# Patient Record
Sex: Female | Born: 1945
Health system: Southern US, Community
[De-identification: ages and names within clinical notes are randomized; demographics above are authoritative.]

## PROBLEM LIST (undated history)

## (undated) DIAGNOSIS — G709 Myoneural disorder, unspecified: Secondary | ICD-10-CM

## (undated) DIAGNOSIS — E079 Disorder of thyroid, unspecified: Secondary | ICD-10-CM

## (undated) DIAGNOSIS — T7840XA Allergy, unspecified, initial encounter: Secondary | ICD-10-CM

## (undated) DIAGNOSIS — M1990 Unspecified osteoarthritis, unspecified site: Secondary | ICD-10-CM

## (undated) DIAGNOSIS — J45909 Unspecified asthma, uncomplicated: Secondary | ICD-10-CM

## (undated) DIAGNOSIS — M810 Age-related osteoporosis without current pathological fracture: Secondary | ICD-10-CM

## (undated) DIAGNOSIS — M797 Fibromyalgia: Secondary | ICD-10-CM

## (undated) HISTORY — DX: Disorder of thyroid, unspecified: E07.9

## (undated) HISTORY — DX: Allergy, unspecified, initial encounter: T78.40XA

## (undated) HISTORY — DX: Age-related osteoporosis without current pathological fracture: M81.0

## (undated) HISTORY — DX: Unspecified asthma, uncomplicated: J45.909

## (undated) HISTORY — DX: Unspecified osteoarthritis, unspecified site: M19.90

## (undated) HISTORY — DX: Fibromyalgia: M79.7

## (undated) HISTORY — DX: Myoneural disorder, unspecified: G70.9

---

## 2011-11-24 ENCOUNTER — Ambulatory Visit (INDEPENDENT_AMBULATORY_CARE_PROVIDER_SITE_OTHER): Payer: Medicare Other | Admitting: Family Medicine

## 2011-11-24 VITALS — BP 140/88 | HR 60 | Temp 98.0°F | Resp 16 | Ht 65.5 in | Wt 186.0 lb

## 2011-11-24 DIAGNOSIS — G43909 Migraine, unspecified, not intractable, without status migrainosus: Secondary | ICD-10-CM | POA: Insufficient documentation

## 2011-11-24 DIAGNOSIS — Z9109 Other allergy status, other than to drugs and biological substances: Secondary | ICD-10-CM

## 2011-11-24 DIAGNOSIS — IMO0001 Reserved for inherently not codable concepts without codable children: Secondary | ICD-10-CM

## 2011-11-24 DIAGNOSIS — K589 Irritable bowel syndrome without diarrhea: Secondary | ICD-10-CM | POA: Insufficient documentation

## 2011-11-24 DIAGNOSIS — E039 Hypothyroidism, unspecified: Secondary | ICD-10-CM | POA: Insufficient documentation

## 2011-11-24 DIAGNOSIS — M810 Age-related osteoporosis without current pathological fracture: Secondary | ICD-10-CM | POA: Insufficient documentation

## 2011-11-24 DIAGNOSIS — M797 Fibromyalgia: Secondary | ICD-10-CM

## 2011-11-24 DIAGNOSIS — J309 Allergic rhinitis, unspecified: Secondary | ICD-10-CM

## 2011-11-24 LAB — TSH: TSH: 0.47 u[IU]/mL (ref 0.350–4.500)

## 2011-11-24 MED ORDER — TRAMADOL HCL 50 MG PO TABS: 50.0000 mg | ORAL_TABLET | ORAL | Status: DC | PRN

## 2011-11-24 NOTE — Progress Notes (Signed)
  Subjective:    Patient ID: Jacqueline Wyatt, female    DOB: 1946-03-08, 66 y.o.   MRN: [id number]  HPI Comments: 66 yo woman with recently diagnosed hyhpothyroidism, now taking Synthroid 25 mcg on empty stomach first thing in the morning. Initially felt worse on the replacement dose of Synthroid, but now is better.  The fibromyalgia and chronic fatigue continue to be very bothersome.  IBS is not too bad now.  No diarrhea now.  Patient is now retired.     Review of Systems     Objective:   Physical Exam  Constitutional: She appears well-developed and well-nourished.  HENT:  Head: Normocephalic and atraumatic.  Right Ear: External ear normal.  Left Ear: External ear normal.  Mouth/Throat: Oropharynx is clear and moist.          Assessment & Plan:  Stable at present re: fibromyalgia. Thyroid status needs checking

## 2011-11-24 NOTE — Patient Instructions (Signed)
Hypothyroidism The thyroid is a large gland located in the lower front of your neck. The thyroid gland helps control metabolism. Metabolism is how your body handles food. It controls metabolism with the hormone thyroxine. When this gland is underactive (hypothyroid), it produces too little hormone.  CAUSES These include:   Absence or destruction of thyroid tissue.   Goiter due to iodine deficiency.   Goiter due to medications.   Congenital defects (since birth).   Problems with the pituitary. This causes a lack of TSH (thyroid stimulating hormone). This hormone tells the thyroid to turn out more hormone.  SYMPTOMS  Lethargy (feeling as though you have no energy)   Cold intolerance   Weight gain (in spite of normal food intake)   Dry skin   Coarse hair   Menstrual irregularity (if severe, may lead to infertility)   Slowing of thought processes  Cardiac problems are also caused by insufficient amounts of thyroid hormone. Hypothyroidism in the newborn is cretinism, and is an extreme form. It is important that this form be treated adequately and immediately or it will lead rapidly to retarded physical and mental development. DIAGNOSIS  To prove hypothyroidism, your caregiver may do blood tests and ultrasound tests. Sometimes the signs are hidden. It may be necessary for your caregiver to watch this illness with blood tests either before or after diagnosis and treatment. TREATMENT  Lafon levels of thyroid hormone are increased by using synthetic thyroid hormone. This is a safe, effective treatment. It usually takes about four weeks to gain the full effects of the medication. After you have the full effect of the medication, it will generally take another four weeks for problems to leave. Your caregiver may start you on Rozycki doses. If you have had heart problems the dose may be gradually increased. It is generally not an emergency to get rapidly to normal. HOME CARE INSTRUCTIONS   Take  your medications as your caregiver suggests. Let your caregiver know of any medications you are taking or start taking. Your caregiver will help you with dosage schedules.   As your condition improves, your dosage needs may increase. It will be necessary to have continuing blood tests as suggested by your caregiver.   Report all suspected medication side effects to your caregiver.  SEEK MEDICAL CARE IF: Seek medical care if you develop:  Sweating.   Tremulousness (tremors).   Anxiety.   Rapid weight loss.   Heat intolerance.   Emotional swings.   Diarrhea.   Weakness.  SEEK IMMEDIATE MEDICAL CARE IF:  You develop chest pain, an irregular heart beat (palpitations), or a rapid heart beat. MAKE SURE YOU:   Understand these instructions.   Will watch your condition.   Will get help right away if you are not doing well or get worse.  Document Released: 10/11/2005 Document Revised: 06/23/2011 Document Reviewed: 05/31/2008 [facility] Patient Information 2012 [place], [place].Fibromyalgia Fibromyalgia is a disorder that is often misunderstood. It is associated with muscular pains and tenderness that comes and goes. It is often associated with fatigue and sleep disturbances. Though it tends to be long-lasting, fibromyalgia is not life-threatening. CAUSES  The exact cause of fibromyalgia is unknown. People with certain gene types are predisposed to developing fibromyalgia and other conditions. Certain factors can play a role as triggers, such as:  Spine disorders.   Arthritis.   Severe injury (trauma) and other physical stressors.   Emotional stressors.  SYMPTOMS   The main symptom is pain and stiffness in the  muscles and joints, which can vary over time.   Sleep and fatigue problems.  Other related symptoms may include:  Bowel and bladder problems.   Headaches.   Visual problems.   Problems with odors and noises.   Depression or mood changes.   Painful periods  (dysmenorrhea).   Dryness of the skin or eyes.  DIAGNOSIS  There are no specific tests for diagnosing fibromyalgia. Patients can be diagnosed accurately from the specific symptoms they have. The diagnosis is made by determining that nothing else is causing the problems. TREATMENT  There is no cure. Management includes medicines and an active, healthy lifestyle. The goal is to enhance physical fitness, decrease pain, and improve sleep. HOME CARE INSTRUCTIONS   Only take over-the-counter or prescription medicines as directed by your caregiver. Sleeping pills, tranquilizers, and pain medicines may make your problems worse.   Evelyn-impact aerobic exercise is very important and advised for treatment. At first, it may seem to make pain worse. Gradually increasing your tolerance will overcome this feeling.   Learning relaxation techniques and how to control stress will help you. Biofeedback, visual imagery, hypnosis, muscle relaxation, yoga, and meditation are all options.   Anti-inflammatory medicines and physical therapy may provide short-term help.   Acupuncture or massage treatments may help.   Take muscle relaxant medicines as suggested by your caregiver.   Avoid stressful situations.   Plan a healthy lifestyle. This includes your diet, sleep, rest, exercise, and friends.   Find and practice a hobby you enjoy.   Join a fibromyalgia support group for interaction, ideas, and sharing advice. This may be helpful.  SEEK MEDICAL CARE IF:  You are not having good results or improvement from your treatment. FOR MORE INFORMATION  National Fibromyalgia Association: www.fmaware.org Arthritis Foundation: www.arthritis.org Document Released: 10/11/2005 Document Revised: 06/23/2011 Document Reviewed: 01/21/2010 [facility] Patient Information 2012 [place], [place].

## 2012-01-19 ENCOUNTER — Other Ambulatory Visit: Payer: Self-pay | Admitting: Family Medicine

## 2012-05-22 ENCOUNTER — Ambulatory Visit (INDEPENDENT_AMBULATORY_CARE_PROVIDER_SITE_OTHER): Payer: Medicare Other | Admitting: Family Medicine

## 2012-05-22 VITALS — BP 122/76 | HR 78 | Temp 98.6°F | Resp 16 | Ht 66.0 in | Wt 175.4 lb

## 2012-05-22 DIAGNOSIS — M62838 Other muscle spasm: Secondary | ICD-10-CM

## 2012-05-22 DIAGNOSIS — E039 Hypothyroidism, unspecified: Secondary | ICD-10-CM

## 2012-05-22 DIAGNOSIS — IMO0001 Reserved for inherently not codable concepts without codable children: Secondary | ICD-10-CM

## 2012-05-22 DIAGNOSIS — H6120 Impacted cerumen, unspecified ear: Secondary | ICD-10-CM

## 2012-05-22 DIAGNOSIS — M797 Fibromyalgia: Secondary | ICD-10-CM

## 2012-05-22 MED ORDER — METAXALONE 800 MG PO TABS: 800.0000 mg | ORAL_TABLET | Freq: Three times a day (TID) | ORAL | Status: DC

## 2012-05-22 MED ORDER — TRAMADOL HCL 50 MG PO TABS: 50.0000 mg | ORAL_TABLET | ORAL | Status: DC | PRN

## 2012-05-22 MED ORDER — LEVOTHYROXINE SODIUM 50 MCG PO TABS: 50.0000 ug | ORAL_TABLET | Freq: Every day | ORAL | Status: DC

## 2012-05-22 NOTE — Progress Notes (Signed)
[facility] [address], [place] [address] [phone]  Date:  05/22/2012   Name:  Jacqueline Wyatt   DOB:  10/07/1946   MRN:  [id number]  PCP:  No primary provider on file.    Chief Complaint: Fibromyalgia and Otalgia   History of Present Illness:  Jacqueline Wyatt is a 66 y.o. very pleasant female patient who presents with the following:  She uses tramadol and occasionally skelaxin for fibromyalgia.  She also needs a refill of her thyroid replacement medication. her last TSH check was about 6 months ago- will check this today as well.  She has also noted some muscle cramps for 10 years (at least)- this is mostly why she uses the skelaxin.    She also has an "ongoing problem" with ear wax build- up.  She has narrow ear canals- she has noted ear symptoms again for about 2 months.  She periodically needs to have her ears washed out- she thinks it is time again.  Her ears feel clogged but not really painful  Patient Active Problem List  Diagnosis  . Hypothyroid  . Migraine  . Environmental allergies  . Fibromyalgia  . Irritable bowel syndrome (IBS)  . Osteoporosis    No past medical history on file.  No past surgical history on file.  History  Substance Use Topics  . Smoking status: Never Smoker   . Smokeless tobacco: Not on file  . Alcohol Use: Not on file    Family History  Problem Relation Age of Onset  . Heart disease Mother   . Hypertension Mother   . Hyperlipidemia Mother   . Hearing loss Mother   . Kidney disease Mother   . Vision loss Mother   . Stroke Father   . Heart disease Maternal Uncle     Allergies  Allergen Reactions  . Almotriptan Malate     Chest pain  . Imitrex (Sumatriptan Base)   . Penicillins   . Relpax (Eletriptan Hydrobromide) Hypertension  . Sulfa Drugs Cross Reactors   . Vantin     dizziness  . Azithromycin Anxiety    Medication list has been reviewed and updated.  Current Outpatient Prescriptions on File Prior  to Visit  Medication Sig Dispense Refill  . aspirin 81 MG tablet Take 81 mg by mouth daily.      Marland Kitchen levothyroxine (SYNTHROID, LEVOTHROID) 50 MCG tablet take 1 tablet by mouth once daily  30 tablet  3  . loratadine (CLARITIN) 10 MG tablet Take 10 mg by mouth daily.      . metaxalone (SKELAXIN) 800 MG tablet Take 800 mg by mouth daily.      . montelukast (SINGULAIR) 10 MG tablet Take 10 mg by mouth at bedtime.      . rizatriptan (MAXALT) 10 MG tablet Take 10 mg by mouth. May repeat in 2 hours if needed      . traMADol (ULTRAM) 50 MG tablet Take 1 tablet (50 mg total) by mouth as needed for pain.  60 tablet  3  . levothyroxine (SYNTHROID, LEVOTHROID) 25 MCG tablet Take 25 mcg by mouth daily.        Review of Systems:  As per HPI- otherwise negative.   Physical Examination: Filed Vitals:   05/22/12 1600  BP: 122/76  Pulse: 78  Temp: 98.6 F (37 C)  Resp: 16   Filed Vitals:   05/22/12 1600  Height: 5\' 6"  (1.676 m)  Weight: 175 lb 6.4 oz (79.561  kg)   Body mass index is 28.31 kg/(m^2). Ideal Body Weight: Weight in (lb) to have BMI = 25: 154.6   GEN: WDWN, NAD, Non-toxic, A & O x 3 HEENT: Atraumatic, Normocephalic. Neck supple. No masses, No LAD.  Oropharynx wnl, PEERL. Jacqueline Wyatt has very narrow ear canals bilaterally, and she does have cerumen buildup which obstructs her TM bilaterally.  Thyroid exam normal Ears and Nose: No external deformity. CV: RRR, No M/G/R. No JVD. No thrill. No extra heart sounds. PULM: CTA B, no wheezes, crackles, rhonchi. No retractions. No resp. distress. No accessory muscle use. EXTR: No c/c/e NEURO Normal gait.  PSYCH: Normally interactive. Conversant. Not depressed or anxious appearing.  Calm demeanor.   Both ears lavaged with warm water.  Able to clear wax from the right, the left retained some wax.  Too dangerous to try and remove it manually due to location very deep in ear canal.  Visible right TM normal  Assessment and Plan: 1. Fibromyalgia   traMADol (ULTRAM) 50 MG tablet, DISCONTINUED: traMADol (ULTRAM) 50 MG tablet  2. Muscle spasm  metaxalone (SKELAXIN) 800 MG tablet  3. Hypothyroidism  levothyroxine (SYNTHROID, LEVOTHROID) 50 MCG tablet, TSH  4. Cerumen impaction     Lavaged ears as above. She will continue to work on her left ear with OTC wax removal drops- let us know if problem does not resolve.  Refilled her synthroid- await TSH and will call with result.  Refilled her tramadol and skelaxin a swell   Jacqueline Robinette, MD

## 2012-07-31 ENCOUNTER — Ambulatory Visit (INDEPENDENT_AMBULATORY_CARE_PROVIDER_SITE_OTHER): Payer: Medicare Other | Admitting: Family Medicine

## 2012-07-31 ENCOUNTER — Ambulatory Visit: Payer: Medicare Other

## 2012-07-31 VITALS — BP 118/88 | HR 61 | Temp 98.2°F | Resp 18 | Ht 66.0 in | Wt 180.0 lb

## 2012-07-31 DIAGNOSIS — M79676 Pain in unspecified toe(s): Secondary | ICD-10-CM

## 2012-07-31 DIAGNOSIS — G43909 Migraine, unspecified, not intractable, without status migrainosus: Secondary | ICD-10-CM

## 2012-07-31 DIAGNOSIS — M79646 Pain in unspecified finger(s): Secondary | ICD-10-CM

## 2012-07-31 DIAGNOSIS — Z23 Encounter for immunization: Secondary | ICD-10-CM

## 2012-07-31 DIAGNOSIS — H6120 Impacted cerumen, unspecified ear: Secondary | ICD-10-CM

## 2012-07-31 DIAGNOSIS — M79609 Pain in unspecified limb: Secondary | ICD-10-CM

## 2012-07-31 MED ORDER — RIZATRIPTAN BENZOATE 10 MG PO TABS: 10.0000 mg | ORAL_TABLET | ORAL | Status: DC | PRN

## 2012-07-31 NOTE — Progress Notes (Signed)
[facility] [address] [place] [address] [phone]  Date:  07/31/2012   Name:  Jacqueline Wyatt   DOB:  June 17, 1946   MRN:  [id number]  PCP:  No primary provider on file.    Chief Complaint: Medication Refill, Ear Fullness, Flu Vaccine, Immunizations and Toe Injury   History of Present Illness:  Jacqueline Wyatt is a 66 y.o. very pleasant female patient who presents with the following:  [name] has a few issues to discuss today:  She hurt her right great toe- she was carrying a piece of wood and dropped it onto her toe.  This occurred 2 days ago- it hurt a lot at first, and has not gotten any better.  She has tried ice which helped with the swelling but not the pain.   She is having a hard time walking but is able to get around slowly.    Also about 4 months ago she hit her right index finger.  It is still sore if she puts it in full flexion.  Otherwise it does not hurt.  She would like to have an x-ray to check on this.   She needs her Maxalt refilled as well.  Her migraines are less frequent.  However, when she does have a HA she needs a maxalt to abort the migraine pain. She has allergies to several triptans, but she has tolerated maxalt for several years without trouble.      She gets "extreme pain in my stomach" when she eats eggs.  She is not sure if she can take a flu shot- she has never had one  Her last tetanus was about 10 years ago and she would like to have a tdap today.   She is afraid that she has ear- wax build up again as her ears have felt full and congested   Patient Active Problem List  Diagnosis  . Hypothyroid  . Migraine  . Environmental allergies  . Fibromyalgia  . Irritable bowel syndrome (IBS)  . Osteoporosis    No past medical history on file.  No past surgical history on file.  History  Substance Use Topics  . Smoking status: Never Smoker   . Smokeless tobacco: Not on file  . Alcohol Use: Not on file    Family History    Problem Relation Age of Onset  . Heart disease Mother   . Hypertension Mother   . Hyperlipidemia Mother   . Hearing loss Mother   . Kidney disease Mother   . Vision loss Mother   . Stroke Father   . Heart disease Maternal Uncle     Allergies  Allergen Reactions  . Almotriptan Malate     Chest pain  . Imitrex (Sumatriptan Base)   . Penicillins   . Relpax (Eletriptan Hydrobromide) Hypertension  . Sulfa Drugs Cross Reactors   . Vantin     dizziness  . Azithromycin Anxiety    Medication list has been reviewed and updated.  Current Outpatient Prescriptions on File Prior to Visit  Medication Sig Dispense Refill  . aspirin 81 MG tablet Take 81 mg by mouth daily.      Marland Kitchen co-enzyme Q-10 30 MG capsule Take 30 mg by mouth 3 (three) times daily.      Marland Kitchen levothyroxine (SYNTHROID, LEVOTHROID) 50 MCG tablet Take 1 tablet (50 mcg total) by mouth daily.  30 tablet  5  . loratadine (CLARITIN) 10 MG tablet Take 10 mg by mouth daily.      Marland Kitchen  metaxalone (SKELAXIN) 800 MG tablet Take 1 tablet (800 mg total) by mouth 3 (three) times daily. As needed for muscle spasm  30 tablet  2  . montelukast (SINGULAIR) 10 MG tablet Take 10 mg by mouth at bedtime.      . rizatriptan (MAXALT) 10 MG tablet Take 10 mg by mouth. May repeat in 2 hours if needed      . traMADol (ULTRAM) 50 MG tablet Take 1 tablet (50 mg total) by mouth as needed for pain.  60 tablet  3    Review of Systems:  As per HPI- otherwise negative.   Physical Examination: Filed Vitals:   07/31/12 1521  BP: 118/88  Pulse: 61  Temp: 98.2 F (36.8 C)  Resp: 18   Filed Vitals:   07/31/12 1521  Height: 5\' 6"  (1.676 m)  Weight: 180 lb (81.647 kg)   Body mass index is 29.05 kg/(m^2). Ideal Body Weight: Weight in (lb) to have BMI = 25: 154.6   GEN: WDWN, NAD, Non-toxic, A & O x 3 HEENT: Atraumatic, Normocephalic. Neck supple. No masses, No LAD.  Oropharynx normal.  PEERL,EOMI.    Very small, torturous ear canals bilaterally with  mild wax build- up.  I am able to see the TM bilaterally, no redness or retractions.   Ears and Nose: No external deformity. CV: RRR, No M/G/R. No JVD. No thrill. No extra heart sounds. PULM: CTA B, no wheezes, crackles, rhonchi. No retractions. No resp. distress. No accessory muscle use. EXTR: No c/c/e.  The right great toe is bruised and diffusely tender.  No deformity or swelling.  She has some tenderness with extreme flexion of her right index finger.  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   NEURO Normal gait.  PSYCH: Normally interactive. Conversant. Not depressed or anxious appearing.  Calm demeanor.   [facility] reading (PRIMARY) by  Dr. Patsy Lager. Right hand: negative for fracture Right foot: no definite fracture  Assessment and Plan: 1. Pain in toe  DG Foot Complete Right  2. Pain in finger  DG Hand Complete Right  3. Migraine  rizatriptan (MAXALT) 10 MG tablet  4. Need for diphtheria-tetanus-pertussis (Tdap) vaccine, adult/adolescent  Tdap vaccine greater than or equal to 7yo IM   Updated Djuana's Tdap today.  Decided not to give flu as she has an egg allergy of undetermined significance.  Irrigated her ears and removed some cerumen.    I do not see a definite fracture of her great toe.  Buddy taped and placed in a post- op shoe.  No fracture seen of her right index finger.  She declined crutches today. Will follow- up as soon as I receive her over- read report.    Abbe Amsterdam, MD

## 2012-08-01 ENCOUNTER — Encounter: Payer: Self-pay | Admitting: Family Medicine

## 2012-11-17 ENCOUNTER — Other Ambulatory Visit: Payer: Self-pay | Admitting: Family Medicine

## 2012-11-17 NOTE — Telephone Encounter (Signed)
Needs office visit.

## 2012-12-20 ENCOUNTER — Other Ambulatory Visit: Payer: Self-pay | Admitting: Physician Assistant

## 2013-01-10 ENCOUNTER — Ambulatory Visit (INDEPENDENT_AMBULATORY_CARE_PROVIDER_SITE_OTHER): Payer: Medicare Other | Admitting: Family Medicine

## 2013-01-10 VITALS — BP 132/84 | HR 68 | Temp 98.1°F | Resp 12 | Ht 66.0 in | Wt 191.4 lb

## 2013-01-10 DIAGNOSIS — M797 Fibromyalgia: Secondary | ICD-10-CM

## 2013-01-10 DIAGNOSIS — E039 Hypothyroidism, unspecified: Secondary | ICD-10-CM

## 2013-01-10 DIAGNOSIS — L659 Nonscarring hair loss, unspecified: Secondary | ICD-10-CM

## 2013-01-10 MED ORDER — LEVOTHYROXINE SODIUM 50 MCG PO TABS: 50.0000 ug | ORAL_TABLET | Freq: Every day | ORAL | Status: DC

## 2013-01-10 NOTE — Patient Instructions (Signed)
We are unable to check your thyroid test today as you have been off medicine.  Return for lab only visit in next 6 weeks, then recheck office visit with a provider 1-2 weeks later to discuss the hair loss and swollen area in hand. Return to the clinic or go to the nearest emergency room if any of your symptoms worsen or new symptoms occur.

## 2013-01-10 NOTE — Progress Notes (Signed)
  Subjective:    Patient ID: Jacqueline Wyatt, female    DOB: [birth year], 67 y.o.   MRN: [id number]  HPI [name] is a 67 y.o. female PCP: [facility]. HA specialist: [facility] - Dr. Neale Burly.  Hypothyroidism - takes levothyroxine qd. No new side effects.  Ran out of meds 5 days ago. Hx of fibromyalgia.    Results for orders placed in visit on 05/22/12  TSH      Result Value Range   TSH 1.890  0.350 - 4.500 uIU/mL      Review of Systems  Endocrine: Positive for cold intolerance.       Some hair loss in top of scalp - one small area.   Musculoskeletal: Positive for myalgias (with fibromyalgia. ).       Objective:   Physical Exam  Vitals reviewed. Constitutional: She is oriented to person, place, and time. She appears well-developed and well-nourished.  HENT:  Head: Normocephalic and atraumatic.  Eyes: Conjunctivae and EOM are normal. Pupils are equal, round, and reactive to light.  Neck: Neck supple. Carotid bruit is not present. Thyromegaly present.  Cardiovascular: Normal rate, regular rhythm, normal heart sounds and intact distal pulses.   Pulmonary/Chest: Effort normal and breath sounds normal.  Abdominal: Soft. She exhibits no pulsatile midline mass. There is no tenderness.  Musculoskeletal:       Hands: Lymphadenopathy:    She has no cervical adenopathy.  Neurological: She is alert and oriented to person, place, and time.  Skin: Skin is warm and dry.     Psychiatric: She has a normal mood and affect. Her behavior is normal.          Assessment & Plan:  Unspecified hypothyroidism - Plan: levothyroxine (SYNTHROID, LEVOTHROID) 50 MCG tablet, TSH.  Off meds - restarted, but advised need for routine follow up and lab only in 6 weeks, then to follow up to discuss hair loss and other concerns.   Fibromyalgia - stable.  Continue skelaxin as prior rx.   Hair loss - Plan: TSH.  As above.,  Dx traction alopecia, but not pulling hair that tight in area. Recheck after tsh.    At end of visit - asked about L hand bump - see exam.  Sore at times. No weakness noted.  ddx tenosynovitis of flexor tendon.  Plan to recheck next ov further, but rom,strength grossly intact.   Patient Instructions  We are unable to check your thyroid test today as you have been off medicine.  Return for lab only visit in next 6 weeks, then recheck office visit with a provider 1-2 weeks later to discuss the hair loss and swollen area in hand. Return to the clinic or go to the nearest emergency room if any of your symptoms worsen or new symptoms occur.    Meds ordered this encounter  Medications  . levothyroxine (SYNTHROID, LEVOTHROID) 50 MCG tablet    Sig: Take 1 tablet (50 mcg total) by mouth daily.    Dispense:  90 tablet    Refill:  0

## 2013-02-01 ENCOUNTER — Telehealth: Payer: Self-pay | Admitting: Family Medicine

## 2013-02-01 NOTE — Telephone Encounter (Signed)
Received a notice from [place]- concern about using skelaxin in seniors over 65.  Sent notice on to pt as an FYI.  As long as the medication helps her and she does not feel sedated should be ok for occasional use.

## 2013-02-22 ENCOUNTER — Other Ambulatory Visit (INDEPENDENT_AMBULATORY_CARE_PROVIDER_SITE_OTHER): Payer: Medicare Other

## 2013-02-22 DIAGNOSIS — E039 Hypothyroidism, unspecified: Secondary | ICD-10-CM

## 2013-02-22 DIAGNOSIS — L659 Nonscarring hair loss, unspecified: Secondary | ICD-10-CM

## 2013-02-23 LAB — TSH: TSH: 3.215 u[IU]/mL (ref 0.350–4.500)

## 2013-02-26 ENCOUNTER — Other Ambulatory Visit: Payer: Self-pay | Admitting: Family Medicine

## 2013-02-26 DIAGNOSIS — E039 Hypothyroidism, unspecified: Secondary | ICD-10-CM

## 2013-02-26 MED ORDER — LEVOTHYROXINE SODIUM 50 MCG PO TABS: 50.0000 ug | ORAL_TABLET | Freq: Every day | ORAL | Status: DC

## 2013-04-13 ENCOUNTER — Ambulatory Visit (INDEPENDENT_AMBULATORY_CARE_PROVIDER_SITE_OTHER): Payer: Medicare Other | Admitting: Family Medicine

## 2013-04-13 VITALS — BP 122/87 | HR 82 | Temp 97.9°F | Resp 16 | Ht 65.5 in | Wt 188.0 lb

## 2013-04-13 DIAGNOSIS — M797 Fibromyalgia: Secondary | ICD-10-CM

## 2013-04-13 DIAGNOSIS — IMO0001 Reserved for inherently not codable concepts without codable children: Secondary | ICD-10-CM

## 2013-04-13 DIAGNOSIS — E039 Hypothyroidism, unspecified: Secondary | ICD-10-CM

## 2013-04-13 MED ORDER — LEVOTHYROXINE SODIUM 50 MCG PO TABS: 50.0000 ug | ORAL_TABLET | Freq: Every day | ORAL | Status: DC

## 2013-04-13 MED ORDER — TRAMADOL HCL 50 MG PO TABS: 50.0000 mg | ORAL_TABLET | ORAL | Status: DC | PRN

## 2013-04-13 NOTE — Patient Instructions (Signed)

## 2013-04-13 NOTE — Progress Notes (Signed)
67 yo woman with fibromyalgia here for med refill.  She is concerned about her hair and so won't do water aerobics.  Objective:  NAD HEENT:  Unremarkable Chest:  Clear Heart: reg, no murmur Skin: clear Results for orders placed in visit on 02/22/13  TSH      Result Value Range   TSH 3.215  0.350 - 4.500 uIU/mL    Assessment:  Stable Unspecified hypothyroidism - Plan: levothyroxine (SYNTHROID, LEVOTHROID) 50 MCG tablet  Fibromyalgia - Plan: traMADol (ULTRAM) 50 MG tablet  Signed, Elvina Sidle, MD

## 2014-02-05 ENCOUNTER — Ambulatory Visit (INDEPENDENT_AMBULATORY_CARE_PROVIDER_SITE_OTHER): Payer: Medicare Other | Admitting: Internal Medicine

## 2014-02-05 VITALS — BP 124/66 | HR 81 | Temp 98.1°F | Resp 16 | Ht 65.5 in | Wt 190.4 lb

## 2014-02-05 DIAGNOSIS — J069 Acute upper respiratory infection, unspecified: Secondary | ICD-10-CM

## 2014-02-05 DIAGNOSIS — M797 Fibromyalgia: Secondary | ICD-10-CM

## 2014-02-05 DIAGNOSIS — R059 Cough, unspecified: Secondary | ICD-10-CM

## 2014-02-05 DIAGNOSIS — E039 Hypothyroidism, unspecified: Secondary | ICD-10-CM

## 2014-02-05 DIAGNOSIS — IMO0001 Reserved for inherently not codable concepts without codable children: Secondary | ICD-10-CM

## 2014-02-05 DIAGNOSIS — R05 Cough: Secondary | ICD-10-CM

## 2014-02-05 LAB — POCT CBC
GRANULOCYTE PERCENT: 51 % (ref 37–80)
HCT, POC: 44 % (ref 37.7–47.9)
Hemoglobin: 14.1 g/dL (ref 12.2–16.2)
Lymph, poc: 2.3 (ref 0.6–3.4)
MCH, POC: 31.6 pg — AB (ref 27–31.2)
MCHC: 32 g/dL (ref 31.8–35.4)
MCV: 98.7 fL — AB (ref 80–97)
MID (cbc): 0.6 (ref 0–0.9)
MPV: 8.7 fL (ref 0–99.8)
POC GRANULOCYTE: 3.1 (ref 2–6.9)
POC LYMPH PERCENT: 39 %L (ref 10–50)
POC MID %: 10 % (ref 0–12)
Platelet Count, POC: 376 10*3/uL (ref 142–424)
RBC: 4.46 M/uL (ref 4.04–5.48)
RDW, POC: 13.5 %
WBC: 6 10*3/uL (ref 4.6–10.2)

## 2014-02-05 MED ORDER — LEVOTHYROXINE SODIUM 50 MCG PO TABS: 50.0000 ug | ORAL_TABLET | Freq: Every day | ORAL | Status: DC

## 2014-02-05 MED ORDER — TRAMADOL HCL 50 MG PO TABS: 50.0000 mg | ORAL_TABLET | ORAL | Status: DC | PRN

## 2014-02-05 NOTE — Progress Notes (Signed)
Subjective:     Patient ID: Jacqueline RobertsJudith Wyatt, female   DOB: [birth year], 68 y.o.   MRN: [id number]  Cough Associated symptoms include chills, myalgias and rhinorrhea. Pertinent negatives include no chest pain, ear pain, headaches, rash, sore throat, shortness of breath or wheezing. Her past medical history is significant for environmental allergies.   68 YO caucasian female [facility] with two weeks of chest congestion with dry cough and rhinorrhea. Also during the first week of the illness she was febrile as high as 101, had body aches and chills, in addition to fatigue and weakness. Last Sunday all symptoms other than her cough congestion and rhinorrhea stopped. She did not have a flu shot this year and she had a known sick contact with the flu. For a week she was taking 2 325 ASA twice daily to help with the fevers but stopped a week ago when the fevers stopped. She takes her Claritin daily for allergy symptoms and tramadol daily for her fibromyalgia. She also states she feels like she has heard "rales when breathing at home.    Review of Systems  Constitutional: Positive for chills, activity change, appetite change and fatigue.       She had chills fever and diaphoresis for one week that she says ceased one week ago  HENT: Positive for congestion, rhinorrhea and sinus pressure. Negative for ear discharge, ear pain, sore throat, trouble swallowing and voice change.   Respiratory: Positive for cough. Negative for shortness of breath and wheezing.        Chest pain with cough Abnormal breath sounds  Cardiovascular: Negative for chest pain, palpitations and leg swelling.  Gastrointestinal: Negative for abdominal pain, diarrhea, constipation, blood in stool and abdominal distention.       1 episode of vomiting last week  Endocrine: Negative for polyuria.  Genitourinary: Negative for dysuria and frequency.  Musculoskeletal: Positive for arthralgias, back pain and myalgias.  Skin: Negative for rash.   Allergic/Immunologic: Positive for environmental allergies.  Neurological: Positive for weakness. Negative for dizziness, syncope and headaches.   Patient Active Problem List   Diagnosis Date Noted  . Hypothyroid 11/24/2011  . Migraine 11/24/2011  . Environmental allergies 11/24/2011  . Fibromyalgia 11/24/2011  . Irritable bowel syndrome (IBS) 11/24/2011  . Osteoporosis 11/24/2011    Prior to Admission medications   Medication Sig Start Date End Date Taking? Authorizing Provider  aspirin 81 MG tablet Take 81 mg by mouth daily.   Yes Historical Provider, MD  calcium-vitamin D (OSCAL WITH D) 500-200 MG-UNIT per tablet Take 1 tablet by mouth.   Yes Historical Provider, MD  co-enzyme Q-10 30 MG capsule Take 30 mg by mouth 3 (three) times daily.   Yes Historical Provider, MD  levothyroxine (SYNTHROID, LEVOTHROID) 50 MCG tablet Take 1 tablet (50 mcg total) by mouth daily. 02/05/14  Yes Tonye Pearsonobert P Doolittle, MD  loratadine (CLARITIN) 10 MG tablet Take 10 mg by mouth daily.   Yes Historical Provider, MD  rizatriptan (MAXALT) 10 MG tablet Take 1 tablet (10 mg total) by mouth as needed for migraine. May repeat in 2 hours if needed 07/31/12  Yes Gwenlyn FoundJessica C Copland, MD  traMADol (ULTRAM) 50 MG tablet Take 1 tablet (50 mg total) by mouth as needed. 02/05/14  Yes Tonye Pearsonobert P Doolittle, MD  metaxalone (SKELAXIN) 800 MG tablet Take 1 tablet (800 mg total) by mouth 3 (three) times daily. As needed for muscle spasm 05/22/12   Pearline CablesJessica C Copland, MD  montelukast (SINGULAIR) 10  MG tablet Take 10 mg by mouth at bedtime.    Historical Provider, MD       Objective:   Physical Exam  Constitutional: She is oriented to person, place, and time. She appears well-developed and well-nourished. No distress.  HENT:  Head: Normocephalic and atraumatic.  Mouth/Throat: Oropharynx is clear and moist. No oropharyngeal exudate.  Bilateral cerumen impaction   Eyes: Conjunctivae are normal. Pupils are equal, round, and reactive  to light.  Cardiovascular: Normal rate, regular rhythm, normal heart sounds and intact distal pulses.  Exam reveals no gallop and no friction rub.   No murmur heard. Pulmonary/Chest: Effort normal and breath sounds normal. No respiratory distress. She has no wheezes. She has no rales. She exhibits no tenderness.  Abdominal: Soft. Bowel sounds are normal. She exhibits no distension. There is no tenderness. There is no rebound and no guarding.  Musculoskeletal: She exhibits no edema.  Neurological: She is alert and oriented to person, place, and time.  Skin: Skin is warm and dry. She is not diaphoretic.  Psychiatric: She has a normal mood and affect.         Assessment:    1) URI 2) hypothyroid 3) medication review     Plan:    1) refilled both tramadol and levothyroxine. Will potentially adjust levothyroxine dose pending TSH. Advised patient that this is likely the end of a course of the flu and that we can no longer test her for the flu as it is 1 week after her symptoms have subsided. Encouraged to use supportive therapy for her URI.    I have completed the patient encounter in its entirety as documented by [facility], with editing by me where necessary. Robert P. Merla Richesoolittle, M.D.

## 2014-02-06 LAB — LIPID PANEL
CHOL/HDL RATIO: 3.2 ratio
CHOLESTEROL: 196 mg/dL (ref 0–200)
HDL: 61 mg/dL (ref >39)
LDL Cholesterol: 96 mg/dL (ref 0–99)
TRIGLYCERIDES: 194 mg/dL — AB (ref <150)
VLDL: 39 mg/dL (ref 0–40)

## 2014-02-06 LAB — TSH: TSH: 2.765 u[IU]/mL (ref 0.350–4.500)

## 2014-02-10 ENCOUNTER — Encounter: Payer: Self-pay | Admitting: Internal Medicine

## 2014-04-11 ENCOUNTER — Encounter: Payer: Self-pay | Admitting: Family Medicine

## 2014-05-03 ENCOUNTER — Encounter: Payer: Self-pay | Admitting: Family Medicine

## 2014-07-05 ENCOUNTER — Telehealth: Payer: Self-pay | Admitting: *Deleted

## 2014-07-05 NOTE — Telephone Encounter (Signed)
Attempted to phone patient to schedule Annual Medicare Wellness Exam, no answer.  Composing & mailing letter.

## 2014-07-30 ENCOUNTER — Telehealth: Payer: Self-pay | Admitting: *Deleted

## 2014-07-30 NOTE — Telephone Encounter (Signed)
Phoned patient and scheduled AMWE/Colo/FOBT screening with Dr. Patsy Lageropland for Monday, 09/09/14 @ 11am.

## 2014-09-09 ENCOUNTER — Encounter: Payer: Self-pay | Admitting: Family Medicine

## 2014-09-09 ENCOUNTER — Ambulatory Visit (INDEPENDENT_AMBULATORY_CARE_PROVIDER_SITE_OTHER): Payer: Medicare Other | Admitting: Family Medicine

## 2014-09-09 VITALS — BP 132/75 | HR 88 | Temp 98.5°F | Resp 16 | Ht 66.0 in | Wt 192.0 lb

## 2014-09-09 DIAGNOSIS — E663 Overweight: Secondary | ICD-10-CM

## 2014-09-09 DIAGNOSIS — Z124 Encounter for screening for malignant neoplasm of cervix: Secondary | ICD-10-CM

## 2014-09-09 DIAGNOSIS — Z131 Encounter for screening for diabetes mellitus: Secondary | ICD-10-CM

## 2014-09-09 DIAGNOSIS — Z130 Encounter for screening for diseases of the blood and blood-forming organs and certain disorders involving the immune mechanism: Secondary | ICD-10-CM

## 2014-09-09 DIAGNOSIS — F411 Generalized anxiety disorder: Secondary | ICD-10-CM | POA: Insufficient documentation

## 2014-09-09 DIAGNOSIS — Z0000 Encounter for general adult medical examination without abnormal findings: Secondary | ICD-10-CM

## 2014-09-09 DIAGNOSIS — E038 Other specified hypothyroidism: Secondary | ICD-10-CM

## 2014-09-09 DIAGNOSIS — Z1211 Encounter for screening for malignant neoplasm of colon: Secondary | ICD-10-CM

## 2014-09-09 DIAGNOSIS — Z23 Encounter for immunization: Secondary | ICD-10-CM

## 2014-09-09 LAB — COMPREHENSIVE METABOLIC PANEL
ALBUMIN: 4.1 g/dL (ref 3.5–5.2)
ALT: 21 U/L (ref 0–35)
AST: 23 U/L (ref 0–37)
Alkaline Phosphatase: 75 U/L (ref 39–117)
BUN: 17 mg/dL (ref 6–23)
CO2: 24 meq/L (ref 19–32)
Calcium: 9.8 mg/dL (ref 8.4–10.5)
Chloride: 104 mEq/L (ref 96–112)
Creat: 0.98 mg/dL (ref 0.50–1.10)
GLUCOSE: 104 mg/dL — AB (ref 70–99)
POTASSIUM: 4.6 meq/L (ref 3.5–5.3)
SODIUM: 140 meq/L (ref 135–145)
Total Bilirubin: 0.4 mg/dL (ref 0.2–1.2)
Total Protein: 7.4 g/dL (ref 6.0–8.3)

## 2014-09-09 LAB — CBC
HCT: 41.3 % (ref 36.0–46.0)
HEMOGLOBIN: 14.6 g/dL (ref 12.0–15.0)
MCH: 32.4 pg (ref 26.0–34.0)
MCHC: 35.4 g/dL (ref 30.0–36.0)
MCV: 91.8 fL (ref 78.0–100.0)
MPV: 10 fL (ref 9.4–12.4)
Platelets: 284 10*3/uL (ref 150–400)
RBC: 4.5 MIL/uL (ref 3.87–5.11)
RDW: 13.8 % (ref 11.5–15.5)
WBC: 4.5 10*3/uL (ref 4.0–10.5)

## 2014-09-09 LAB — TSH: TSH: 2.774 u[IU]/mL (ref 0.350–4.500)

## 2014-09-09 NOTE — Progress Notes (Signed)
[facility] [address], [place] [address] [id number]- [phone]  Date:  09/09/2014   Name:  Jacqueline Wyatt   DOB:  06/12/1946   MRN:  [id number]  PCP:  No PCP Per Patient    Chief Complaint: Annual Exam   History of Present Illness:  Jacqueline Wyatt is a 68 y.o. very pleasant female patient who presents with the following:  Here today for a CPE.  Except for her fibromyalgia and other chronic complaints she is doing well.  She notes that she tends to have fatigue; wonders if she might have chronic fatigue syndrome. She may sleep 9- 10 hours a night.  She does feel better once she gets up and gets moving.  She has not been told that she snores.    She has never had a colonoscopy- did have sigmoid years ago.  She does have IBS and states she has had this "since I was 68 years old.  I remember being 68 years old and having IBS."  However these sx are not that severe as long as she manages her stress.    She did have some cereal today.  Not fasting completly She has never had a pneumonia vaccine. She also needs a shingles vaccine rx.    Her last pap was done here.  It was in 2004.  She states that her last pap was very painful- she is willing to give this a try today but we will stop if she has too much discomfort.  She states that she would not consider being sexually active due to pain, but she does not want to have sex anyway so she does not desire to try any estrogen cream, etc.   She bring with her lots of unusual lab results from various clinics and labs- testing her hair, stool studies for various chemicals, etc.  Explained to her that I am not able to tell her what if anything these labs mean.  However she does show me some "neurotransmitter testing" that she had done that shows her serotonin and dopamine levels are [place].  However she is not interested in trying any medication for depression  She gives a lot of detailed information about her health going back many years in the  past.  She seems anxious and very worried about her health.  Has several theories about the etiology of her health problems that she goes into in detail.    Patient Active Problem List   Diagnosis Date Noted  . Hypothyroid 11/24/2011  . Migraine 11/24/2011  . Environmental allergies 11/24/2011  . Fibromyalgia 11/24/2011  . Irritable bowel syndrome (IBS) 11/24/2011  . Osteoporosis 11/24/2011    Past Medical History  Diagnosis Date  . Allergy   . Arthritis   . Neuromuscular disorder   . Asthma   . Thyroid disease   . Osteoporosis   . Fibromyalgia muscle pain   . Fibromyalgia     History reviewed. No pertinent past surgical history.  History  Substance Use Topics  . Smoking status: Never Smoker   . Smokeless tobacco: Not on file  . Alcohol Use: Not on file    Family History  Problem Relation Age of Onset  . Heart disease Mother   . Hypertension Mother   . Hyperlipidemia Mother   . Hearing loss Mother   . Kidney disease Mother   . Vision loss Mother   . Lung disease Mother   . Stroke Father   . Heart disease  Maternal Uncle   . Arthritis Sister   . Meniere's disease Brother   . Cancer Maternal Grandmother   . Stroke Maternal Grandmother   . Heart disease Maternal Grandfather     Allergies  Allergen Reactions  . Almotriptan Malate     Chest pain  . Eggs Or Egg-Derived Products     Stomach pain  . Imitrex [Sumatriptan Base]   . Penicillins   . Relpax [Eletriptan Hydrobromide] Hypertension  . Sulfa Drugs Cross Reactors   . Vantin     dizziness  . Azithromycin Anxiety    Medication list has been reviewed and updated.  Current Outpatient Prescriptions on File Prior to Visit  Medication Sig Dispense Refill  . aspirin 81 MG tablet Take 81 mg by mouth daily.    . calcium-vitamin D (OSCAL WITH D) 500-200 MG-UNIT per tablet Take 1 tablet by mouth.    . co-enzyme Q-10 30 MG capsule Take 30 mg by mouth 3 (three) times daily.    Marland Kitchen. levothyroxine (SYNTHROID,  LEVOTHROID) 50 MCG tablet Take 1 tablet (50 mcg total) by mouth daily. 90 tablet 3  . loratadine (CLARITIN) 10 MG tablet Take 10 mg by mouth daily.    . metaxalone (SKELAXIN) 800 MG tablet Take 1 tablet (800 mg total) by mouth 3 (three) times daily. As needed for muscle spasm 30 tablet 2  . montelukast (SINGULAIR) 10 MG tablet Take 10 mg by mouth at bedtime.    . rizatriptan (MAXALT) 10 MG tablet Take 1 tablet (10 mg total) by mouth as needed for migraine. May repeat in 2 hours if needed 10 tablet 2  . traMADol (ULTRAM) 50 MG tablet Take 1 tablet (50 mg total) by mouth as needed. 60 tablet 5   No current facility-administered medications on file prior to visit.    Review of Systems:  As per HPI- otherwise negative.   Physical Examination: Filed Vitals:   09/09/14 1120  BP: 132/75  Pulse: 88  Temp: 98.5 F (36.9 C)  Resp: 16   Filed Vitals:   09/09/14 1120  Height: 5\' 6"  (1.676 m)  Weight: 192 lb (87.091 kg)   Body mass index is 31 kg/(m^2). Ideal Body Weight: Weight in (lb) to have BMI = 25: 154.6  GEN: WDWN, NAD, Non-toxic, A & O x 3, very talkative and anxious, overweight HEENT: Atraumatic, Normocephalic. Neck supple. No masses, No LAD.  Bilateral TM wnl, oropharynx normal.  PEERL,EOMI.   Ears and Nose: No external deformity. CV: RRR, No M/G/R. No JVD. No thrill. No extra heart sounds. PULM: CTA B, no wheezes, crackles, rhonchi. No retractions. No resp. distress. No accessory muscle use. ABD: S, NT, ND, +BS. No rebound. No HSM. EXTR: No c/c/e NEURO Normal gait.  PSYCH: Normally interactive. Conversant.  GU: performed a gentle speculum exam with smallest speculum available.  She was able to tolerate a pap.  Did not perform bimanual exam  Assessment and Plan: Other specified hypothyroidism - Plan: TSH  Immunization due - Plan: Pneumococcal conjugate vaccine 13-valent IM  Overweight - Plan: CANCELED: Lipid panel  Screening for diabetes mellitus - Plan: Comprehensive  metabolic panel  Screening for deficiency anemia - Plan: CBC  Screening for cervical cancer - Plan: Pap IG and HPV (high risk) DNA detection  Screening for colon cancer - Plan: Ambulatory referral to Gastroenterology  Generalized anxiety disorder  Updated her prevnar today, she refuses a flu shot She refuses a colonoscopy but might consider a sigmoidoscopy which she had in the  past- will make referral for her  She is not willing to consider treatment for depression and anxiety today.   Will plan further follow- up pending labs.   Signed Abbe AmsterdamJessica Dallon Dacosta, MD

## 2014-09-09 NOTE — Patient Instructions (Addendum)
I will be in touch with your labs.  Assuming your pap looks ok you do not have to have one again!  We will set you up to see GI for a sigmoidoscopy if you would prefer

## 2014-09-10 ENCOUNTER — Encounter: Payer: Self-pay | Admitting: Family Medicine

## 2014-09-10 LAB — PAP IG AND HPV HIGH-RISK: HPV DNA HIGH RISK: NOT DETECTED

## 2014-10-30 ENCOUNTER — Ambulatory Visit (INDEPENDENT_AMBULATORY_CARE_PROVIDER_SITE_OTHER): Payer: Medicare Other | Admitting: Nurse Practitioner

## 2014-10-30 ENCOUNTER — Encounter: Payer: Self-pay | Admitting: Nurse Practitioner

## 2014-10-30 VITALS — BP 138/84 | HR 80 | Ht 66.0 in | Wt 195.0 lb

## 2014-10-30 DIAGNOSIS — Z1211 Encounter for screening for malignant neoplasm of colon: Secondary | ICD-10-CM

## 2014-10-30 NOTE — Patient Instructions (Addendum)
It has been recommended to you by your physician that you have a(n)Colonoscopy  completed. Per your request, we did not schedule the procedure(s) today. Please contact our office at [phone] should you decide to have the procedure completed. 

## 2014-10-31 ENCOUNTER — Encounter: Payer: Self-pay | Admitting: Nurse Practitioner

## 2014-10-31 DIAGNOSIS — Z1211 Encounter for screening for malignant neoplasm of colon: Secondary | ICD-10-CM | POA: Insufficient documentation

## 2014-10-31 NOTE — Progress Notes (Signed)
Agree with initial assessment and plans based on patient preference for colon cancer screening

## 2014-10-31 NOTE — Progress Notes (Signed)
HPI :  Patient is a 69 year old female, new to this practice, referred for colon cancer screening. She has a longstanding history of D-IBS but manages it without meds. No other GI complaints. Specifically, no blood in stool, bowel changes, abdominal pain or weight loss. No FMH of colon cancer. November 2015 labs including CBC, CMET, TSH were unremarkable.    Past Medical History  Diagnosis Date  . Allergy   . Arthritis   . Neuromuscular disorder   . Asthma   . Thyroid disease   . Osteoporosis   . Fibromyalgia     Family History  Problem Relation Age of Onset  . Heart disease Mother   . Hypertension Mother   . Hyperlipidemia Mother   . Hearing loss Mother   . Kidney disease Mother   . Vision loss Mother   . Lung disease Mother   . Stroke Father   . Heart disease Maternal Uncle   . Arthritis Sister   . Meniere's disease Brother   . Cancer Maternal Grandmother   . Stroke Maternal Grandmother   . Heart disease Maternal Grandfather    History  Substance Use Topics  . Smoking status: Never Smoker   . Smokeless tobacco: Not on file  . Alcohol Use: Not on file   Current Outpatient Prescriptions  Medication Sig Dispense Refill  . aspirin 81 MG tablet Take 81 mg by mouth daily.    . Biotin 1 MG CAPS Take by mouth.    . calcium-vitamin D (OSCAL WITH D) 500-200 MG-UNIT per tablet Take 1 tablet by mouth.    . co-enzyme Q-10 30 MG capsule Take 30 mg by mouth 3 (three) times daily.    Marland Kitchen levothyroxine (SYNTHROID, LEVOTHROID) 50 MCG tablet Take 1 tablet (50 mcg total) by mouth daily. 90 tablet 3  . loratadine (CLARITIN) 10 MG tablet Take 10 mg by mouth daily.    . metaxalone (SKELAXIN) 800 MG tablet Take 1 tablet (800 mg total) by mouth 3 (three) times daily. As needed for muscle spasm 30 tablet 2  . montelukast (SINGULAIR) 10 MG tablet Take 10 mg by mouth at bedtime.    . rizatriptan (MAXALT) 10 MG tablet Take 1 tablet (10 mg total) by mouth as needed for migraine. May repeat in  2 hours if needed 10 tablet 2  . traMADol (ULTRAM) 50 MG tablet Take 1 tablet (50 mg total) by mouth as needed. 60 tablet 5   No current facility-administered medications for this visit.   Allergies  Allergen Reactions  . Almotriptan Malate     Chest pain  . Eggs Or Egg-Derived Products     Stomach pain  . Imitrex [Sumatriptan Base]   . Penicillins   . Relpax [Eletriptan Hydrobromide] Hypertension  . Sulfa Drugs Cross Reactors   . Vantin     dizziness  . Azithromycin Anxiety   Review of Systems: All systems reviewed and negative except where noted in HPI.   Physical Exam: BP 138/84 mmHg  Pulse 80  Ht  (1.676 m)  Wt 195 lb (88.451 kg)  BMI 31.49 kg/m2 Constitutional: Pleasant,well-developed, white female in no acute distress. HEENT: Normocephalic and atraumatic. Conjunctivae are normal. No scleral icterus. Neck supple.  Cardiovascular: Normal rate, regular rhythm.  Pulmonary/chest: Effort normal and breath sounds normal. No wheezing, rales or rhonchi. Abdominal: Soft, nondistended, nontender. Bowel sounds active throughout. There are no masses palpable. No hepatomegaly. Extremities: no edema Lymphadenopathy: No cervical adenopathy noted. Neurological: Alert and  oriented to person place and time. Skin: Skin is warm and dry. No rashes noted. Psychiatric: Normal mood and affect. Behavior is normal.   ASSESSMENT AND PLAN:  241. 69 year old female referred by PCP for colon cancer screening. Patient has never been sedated and adamantly refuses sedation for colon cancer screening. She requests a flexible sigmoidoscopy. We discussed options:  Flexible sigmoidoscopy alone not adequate screening. We could add barium enema but this is still suboptimal choice  Unsedated colonoscopy - I don't recommend and she isn't interested anyway.  Virtual colonoscopy - insurance may not approve.  Cologuard  She wants to go with a virtual colonoscopy. She understands that abnormal  findings would warrant colonoscopy.  If insurance doesn't pay then patient will need to reconsider remaining options.  2. Chronic D-IBS.  3. Fibromyalgia

## 2015-01-13 ENCOUNTER — Encounter: Payer: Self-pay | Admitting: *Deleted

## 2015-01-31 ENCOUNTER — Ambulatory Visit (INDEPENDENT_AMBULATORY_CARE_PROVIDER_SITE_OTHER): Payer: Medicare Other | Admitting: Family Medicine

## 2015-01-31 VITALS — BP 128/80 | HR 84 | Temp 98.3°F | Resp 17 | Ht 68.0 in | Wt 197.0 lb

## 2015-01-31 DIAGNOSIS — M797 Fibromyalgia: Secondary | ICD-10-CM | POA: Diagnosis not present

## 2015-01-31 DIAGNOSIS — E038 Other specified hypothyroidism: Secondary | ICD-10-CM | POA: Diagnosis not present

## 2015-01-31 LAB — TSH: TSH: 2.946 u[IU]/mL (ref 0.350–4.500)

## 2015-01-31 MED ORDER — TRAMADOL HCL 50 MG PO TABS: 50.0000 mg | ORAL_TABLET | ORAL | Status: DC | PRN

## 2015-01-31 NOTE — Progress Notes (Signed)
[facility] [address], [place] [id number] [phone]  Date:  01/31/2015   Name:  Jacqueline RobertsJudith Wyatt   DOB:  [birth year]   MRN:  [id number]  PCP:  Abbe AmsterdamOPLAND,Jaislyn Blinn, MD    Chief Complaint: Medication Refill   History of Present Illness:  Jacqueline Wyatt is a 69 y.o. very pleasant female patient who presents with the following:  She would like a refill of her tramadol today. She was given tramadol last year by Dr. Merla Richesoolittle for her back.  She takes a half tablet twice a day which does help with her chronic pain- she never takes more than this, but if she does not take it her back will hurt She is about due for a TSH check and would like to do this today as she is here.  OW no concerns today  Patient Active Problem List   Diagnosis Date Noted  . Screening for colon cancer 10/31/2014  . Generalized anxiety disorder 09/09/2014  . Hypothyroid 11/24/2011  . Migraine 11/24/2011  . Environmental allergies 11/24/2011  . Fibromyalgia 11/24/2011  . Irritable bowel syndrome (IBS) 11/24/2011  . Osteoporosis 11/24/2011    Past Medical History  Diagnosis Date  . Allergy   . Arthritis   . Neuromuscular disorder   . Asthma   . Thyroid disease   . Osteoporosis   . Fibromyalgia muscle pain   . Fibromyalgia     No past surgical history on file.  History  Substance Use Topics  . Smoking status: Never Smoker   . Smokeless tobacco: Not on file  . Alcohol Use: Not on file    Family History  Problem Relation Age of Onset  . Heart disease Mother   . Hypertension Mother   . Hyperlipidemia Mother   . Hearing loss Mother   . Kidney disease Mother   . Vision loss Mother   . Lung disease Mother   . Stroke Father   . Heart disease Maternal Uncle   . Arthritis Sister   . Meniere's disease Brother   . Cancer Maternal Grandmother   . Stroke Maternal Grandmother   . Heart disease Maternal Grandfather     Allergies  Allergen Reactions  . Almotriptan Malate     Chest  pain  . Eggs Or Egg-Derived Products     Stomach pain  . Imitrex [Sumatriptan Base]   . Penicillins   . Relpax [Eletriptan Hydrobromide] Hypertension  . Sulfa Drugs Cross Reactors   . Vantin     dizziness  . Azithromycin Anxiety    Medication list has been reviewed and updated.  Current Outpatient Prescriptions on File Prior to Visit  Medication Sig Dispense Refill  . aspirin 81 MG tablet Take 81 mg by mouth daily.    . Biotin 1 MG CAPS Take by mouth.    . calcium-vitamin D (OSCAL WITH D) 500-200 MG-UNIT per tablet Take 1 tablet by mouth.    . co-enzyme Q-10 30 MG capsule Take 30 mg by mouth 3 (three) times daily.    Marland Kitchen. levothyroxine (SYNTHROID, LEVOTHROID) 50 MCG tablet Take 1 tablet (50 mcg total) by mouth daily. 90 tablet 3  . loratadine (CLARITIN) 10 MG tablet Take 10 mg by mouth daily.    . metaxalone (SKELAXIN) 800 MG tablet Take 1 tablet (800 mg total) by mouth 3 (three) times daily. As needed for muscle spasm 30 tablet 2  . montelukast (SINGULAIR) 10 MG tablet Take 10 mg by mouth at bedtime.    .Marland Kitchen  rizatriptan (MAXALT) 10 MG tablet Take 1 tablet (10 mg total) by mouth as needed for migraine. May repeat in 2 hours if needed 10 tablet 2  . traMADol (ULTRAM) 50 MG tablet Take 1 tablet (50 mg total) by mouth as needed. 60 tablet 5   No current facility-administered medications on file prior to visit.    Review of Systems:  As per HPI- otherwise negative.   Physical Examination: Filed Vitals:   01/31/15 1517  BP: 128/80  Pulse: 84  Temp: 98.3 F (36.8 C)  Resp: 17   Filed Vitals:   01/31/15 1517  Height:  (1.727 m)  Weight: 197 lb (89.359 kg)   Body mass index is 29.96 kg/(m^2). Ideal Body Weight: Weight in (lb) to have BMI = 25: 164.1  GEN: WDWN, NAD, Non-toxic, A & O x 3, looks well, overweight HEENT: Atraumatic, Normocephalic. Neck supple. No masses, No LAD. Ears and Nose: No external deformity. CV: RRR, No M/G/R. No JVD. No thrill. No extra heart  sounds. PULM: CTA B, no wheezes, crackles, rhonchi. No retractions. No resp. distress. No accessory muscle use. EXTR: No c/c/e NEURO Normal gait.  PSYCH: Normally interactive. Conversant. Not depressed or anxious appearing.  Calm demeanor.    Assessment and Plan: Fibromyalgia - Plan: traMADol (ULTRAM) 50 MG tablet  Other specified hypothyroidism - Plan: TSH  Did refill of her tramadol today, check TSH  Signed Abbe Amsterdam, MD

## 2015-01-31 NOTE — Patient Instructions (Signed)
Good to see you today-  I will be in touch with your thyroid level asap.  Take care!

## 2015-02-01 ENCOUNTER — Encounter: Payer: Self-pay | Admitting: Family Medicine

## 2015-04-17 ENCOUNTER — Other Ambulatory Visit: Payer: Self-pay | Admitting: Internal Medicine

## 2015-10-16 ENCOUNTER — Ambulatory Visit (INDEPENDENT_AMBULATORY_CARE_PROVIDER_SITE_OTHER): Payer: Medicare Other | Admitting: Physician Assistant

## 2015-10-16 ENCOUNTER — Encounter: Payer: Self-pay | Admitting: Physician Assistant

## 2015-10-16 VITALS — BP 128/64 | HR 81 | Temp 98.3°F | Resp 16 | Ht 67.0 in | Wt 184.0 lb

## 2015-10-16 DIAGNOSIS — J309 Allergic rhinitis, unspecified: Secondary | ICD-10-CM

## 2015-10-16 DIAGNOSIS — E039 Hypothyroidism, unspecified: Secondary | ICD-10-CM

## 2015-10-16 DIAGNOSIS — Z23 Encounter for immunization: Secondary | ICD-10-CM | POA: Diagnosis not present

## 2015-10-16 DIAGNOSIS — M797 Fibromyalgia: Secondary | ICD-10-CM | POA: Diagnosis not present

## 2015-10-16 DIAGNOSIS — M810 Age-related osteoporosis without current pathological fracture: Secondary | ICD-10-CM

## 2015-10-16 DIAGNOSIS — Z1211 Encounter for screening for malignant neoplasm of colon: Secondary | ICD-10-CM

## 2015-10-16 DIAGNOSIS — M62838 Other muscle spasm: Secondary | ICD-10-CM

## 2015-10-16 DIAGNOSIS — E782 Mixed hyperlipidemia: Secondary | ICD-10-CM | POA: Diagnosis not present

## 2015-10-16 LAB — COMPREHENSIVE METABOLIC PANEL
ALBUMIN: 4 g/dL (ref 3.6–5.1)
ALT: 19 U/L (ref 6–29)
AST: 21 U/L (ref 10–35)
Alkaline Phosphatase: 75 U/L (ref 33–130)
BUN: 16 mg/dL (ref 7–25)
CHLORIDE: 103 mmol/L (ref 98–110)
CO2: 25 mmol/L (ref 20–31)
CREATININE: 0.93 mg/dL (ref 0.50–0.99)
Calcium: 9.3 mg/dL (ref 8.6–10.4)
GLUCOSE: 105 mg/dL — AB (ref 65–99)
Potassium: 4.3 mmol/L (ref 3.5–5.3)
SODIUM: 137 mmol/L (ref 135–146)
Total Bilirubin: 0.4 mg/dL (ref 0.2–1.2)
Total Protein: 6.9 g/dL (ref 6.1–8.1)

## 2015-10-16 LAB — LIPID PANEL
CHOL/HDL RATIO: 3.2 ratio (ref <=5.0)
Cholesterol: 190 mg/dL (ref 125–200)
HDL: 60 mg/dL (ref >=46)
LDL CALC: 95 mg/dL (ref <130)
Triglycerides: 175 mg/dL — ABNORMAL HIGH (ref <150)
VLDL: 35 mg/dL — AB (ref <30)

## 2015-10-16 LAB — TSH: TSH: 2.816 u[IU]/mL (ref 0.350–4.500)

## 2015-10-16 MED ORDER — METAXALONE 800 MG PO TABS: 800.0000 mg | ORAL_TABLET | Freq: Three times a day (TID) | ORAL | Status: DC

## 2015-10-16 MED ORDER — LEVOTHYROXINE SODIUM 50 MCG PO TABS: 50.0000 ug | ORAL_TABLET | Freq: Every day | ORAL | Status: DC

## 2015-10-16 MED ORDER — ZOSTER VACCINE LIVE 19400 UNT/0.65ML ~~LOC~~ SOLR
0.6500 mL | Freq: Once | SUBCUTANEOUS | Status: DC
Start: 2015-10-16 — End: 2016-08-17

## 2015-10-16 MED ORDER — TRAMADOL HCL 50 MG PO TABS: 50.0000 mg | ORAL_TABLET | ORAL | Status: DC | PRN

## 2015-10-16 MED ORDER — MONTELUKAST SODIUM 10 MG PO TABS: 10.0000 mg | ORAL_TABLET | Freq: Every day | ORAL | Status: DC

## 2015-10-16 NOTE — Patient Instructions (Signed)
I will call you with lab results. Continue on current dose of synthroid unless you tell me otherwise. Take [facility]ingles [facility] to [facility] to get. Bring back stool cards. Return in 6 months for complete physical.

## 2015-10-16 NOTE — Progress Notes (Signed)
[facility] [address], [place] [address] [phone]  Date:  10/16/2015   Name:  Jacqueline RobertsJudith Wyatt   DOB:  May 23, 1946   MRN:  [id number]  PCP:  Jacqueline AmsterdamOPLAND,Jacqueline Wyatt    Chief Complaint: Medication Refill   History of Present Illness:  This is a 69 y.o. female with PMH fibromyalgia, IBS, GAD, hypothyroid, osteoporosis who is presenting for thyroid check.  Has been working on her diet. Has been eliminating carbs. Has lost 13 pounds since last visit 4/16. Is walking for exercise occ but states feels worse after exercise d/t fibromyalgia. Swimming in a pool is better but she states "then my hair gets wet and that's an ordeal". She states most of her life she weighed 135 lb and she wants to get back to that.  She takes synthroid 50 mcg QD. She has noticed thinning of her hair. Will recheck TSH today.  Pt never had colonoscopy. She is wary of anesthesia. She has decided if she gets a colonoscopy it will be a virtual colonoscopy but that is $800 out of pocket and she does not want to do that now. She has done stool cards before.  She takes 1/2 tab tramadol BID for fibromyalgia. She has done this for years. Last refill 8 months ago.  Has dx of osteoporosis but states she has stopped her calcium and vit D supplements. She knows she needs to start taking again.  prevnar 2015 tdap 2013 Doesn't do flu shots since she is allergic to eggs. Never had zostavax. Wants rx today.   Review of Systems:  Review of Systems See HPI  Patient Active Problem List   Diagnosis Date Noted  . Screening for colon cancer 10/31/2014  . Generalized anxiety disorder 09/09/2014  . Hypothyroid 11/24/2011  . Migraine 11/24/2011  . Environmental allergies 11/24/2011  . Fibromyalgia 11/24/2011  . Irritable bowel syndrome (IBS) 11/24/2011  . Osteoporosis 11/24/2011    Prior to Admission medications   Medication Sig Start Date End Date Taking? Authorizing Provider  aspirin 81 MG tablet  Take 81 mg by mouth daily.   Yes Historical Provider, Wyatt  Biotin 1 MG CAPS Take by mouth.   Yes Historical Provider, Wyatt  calcium-vitamin D (OSCAL WITH D) 500-200 MG-UNIT per tablet Take 1 tablet by mouth.   Yes Historical Provider, Wyatt  co-enzyme Q-10 30 MG capsule Take 30 mg by mouth 3 (three) times daily.   Yes Historical Provider, Wyatt  levothyroxine (SYNTHROID, LEVOTHROID) 50 MCG tablet take 1 tablet by mouth once daily 04/17/15  Yes Jacqueline C Copland, Wyatt  loratadine (CLARITIN) 10 MG tablet Take 10 mg by mouth daily.   Yes Historical Provider, Wyatt  metaxalone (SKELAXIN) 800 MG tablet Take 1 tablet (800 mg total) by mouth 3 (three) times daily. As needed for muscle spasm 05/22/12  Yes Jacqueline C Copland, Wyatt  montelukast (SINGULAIR) 10 MG tablet Take 10 mg by mouth at bedtime.   Yes Historical Provider, Wyatt  rizatriptan (MAXALT) 10 MG tablet Take 1 tablet (10 mg total) by mouth as needed for migraine. May repeat in 2 hours if needed 07/31/12  Yes Jacqueline FoundJessica C Copland, Wyatt  traMADol (ULTRAM) 50 MG tablet Take 1 tablet (50 mg total) by mouth as needed. 01/31/15  Yes Jacqueline CablesJessica C Copland, Wyatt           Allergies  Allergen Reactions  . Almotriptan Malate     Chest pain  . Eggs Or Egg-Derived Products  Stomach pain  . Imitrex [Sumatriptan Base]   . Penicillins   . Relpax [Eletriptan Hydrobromide] Hypertension  . Sulfa Drugs Cross Reactors   . Vantin     dizziness  . Azithromycin Anxiety    History reviewed. No pertinent past surgical history.  Social History  Substance Use Topics  . Smoking status: Never Smoker   . Smokeless tobacco: None  . Alcohol Use: None    Family History  Problem Relation Age of Onset  . Heart disease Mother   . Hypertension Mother   . Hyperlipidemia Mother   . Hearing loss Mother   . Kidney disease Mother   . Vision loss Mother   . Lung disease Mother   . Stroke Father   . Heart disease Maternal Uncle   . Arthritis Sister   . Meniere's disease Brother   . Cancer  Maternal Grandmother   . Stroke Maternal Grandmother   . Heart disease Maternal Grandfather     Medication list has been reviewed and updated.  Physical Examination:  Physical Exam  Constitutional: She is oriented to person, place, and time. She appears well-developed and well-nourished. No distress.  HENT:  Head: Normocephalic and atraumatic.  Right Ear: Hearing normal.  Left Ear: Hearing normal.  Nose: Nose normal.  Eyes: Conjunctivae and lids are normal. Right eye exhibits no discharge. Left eye exhibits no discharge. No scleral icterus.  Neck: Trachea normal. Carotid bruit is not present. No thyromegaly present.  Cardiovascular: Normal rate, regular rhythm, normal heart sounds and normal pulses.   No murmur heard. Pulmonary/Chest: Effort normal and breath sounds normal. No respiratory distress. She has no wheezes. She has no rhonchi. She has no rales.  Musculoskeletal: Normal range of motion.  Lymphadenopathy:       Head (right side): No submental, no submandibular and no tonsillar adenopathy present.       Head (left side): No submental, no submandibular and no tonsillar adenopathy present.    She has no cervical adenopathy.  Neurological: She is alert and oriented to person, place, and time.  Skin: Skin is warm, dry and intact. No lesion and no rash noted.  Psychiatric: She has a normal mood and affect. Her speech is normal and behavior is normal. Thought content normal.   BP 128/64 mmHg  Pulse 81  Temp(Src) 98.3 F (36.8 C)  Resp 16  Ht  (1.702 m)  Wt 184 lb (83.462 kg)  BMI 28.81 kg/m2  Assessment and Plan:  1. Hypothyroidism, unspecified hypothyroidism type - levothyroxine (SYNTHROID, LEVOTHROID) 50 MCG tablet; Take 1 tablet (50 mcg total) by mouth daily.  Dispense: 90 tablet; Refill: 1 - TSH  2. Need for shingles vaccine - zoster vaccine live, PF, (ZOSTAVAX) 57846 UNT/0.65ML injection; Inject 19,400 Units into the skin once.  Dispense: 1 each; Refill:  0  3. Muscle spasm Uses skelaxin as needed for muscle spasm. Uses infrequently. Refilled. - Comprehensive metabolic panel - metaxalone (SKELAXIN) 800 MG tablet; Take 1 tablet (800 mg total) by mouth 3 (three) times daily. As needed for muscle spasm  Dispense: 30 tablet; Refill: 2  4. Allergic rhinitis, unspecified allergic rhinitis type - montelukast (SINGULAIR) 10 MG tablet; Take 1 tablet (10 mg total) by mouth at bedtime.  Dispense: 30 tablet; Refill: 11  5. Elevated triglycerides with high cholesterol - Lipid panel  6. Fibromyalgia Pain controlled with 1/2 tab BID. Refilled. - traMADol (ULTRAM) 50 MG tablet; Take 1 tablet (50 mg total) by mouth as needed.  Dispense: 60  tablet; Refill: 5  7. Colon cancer screening Does not want colonoscopy at this time. She will do home stool cards. - POC Hemoccult Bld/Stl (3-Cd Home Screen); Future  8. Osteoporosis Advised she start back on vit D 800 units daily and calcium 1200 mg daily. - Vitamin D 1,25 dihydroxy  Return in 6 months for CPE.   [place] Brackett, MHS [facility]  10/16/2015

## 2015-10-17 ENCOUNTER — Telehealth: Payer: Self-pay

## 2015-10-17 NOTE — Telephone Encounter (Signed)
Fax from pharm advised metaxalone is not covered by ins. Preferred alternatives are NSAIDS Diflunisal and Etodolac, and also Celecoxib. Joni Reiningicole, I noticed that there is no hx of any NSAIDS or celebrex on pt's med list. Is there a reason she can't take NSAIDS? If so, I might be able to get metaxalone or perhaps flexeril or robaxin covered w/ a PA.

## 2015-10-21 NOTE — Telephone Encounter (Signed)
Pt states NSAIDs make her nauseated and makes her "feel bad". Is that enough to get skelaxin covered? She takes for muscle cramps very intermittently and works well for her.

## 2015-10-21 NOTE — Telephone Encounter (Signed)
PA completed on covermymeds with info from [place] below, NSAIDS contraindicated d/t h/o intolerable GI side effects with NSAID use. Pending.

## 2015-10-22 LAB — VITAMIN D 1,25 DIHYDROXY
VITAMIN D 1, 25 (OH) TOTAL: 29 pg/mL (ref 18–72)
VITAMIN D3 1, 25 (OH): 29 pg/mL

## 2015-10-27 NOTE — Telephone Encounter (Signed)
PA was denied. I have written an appeal letter and faxed to [employer]. Please see details of denial and reason for appeal in the letter written in EPIC on 10/27/15. Appeal pending.

## 2015-10-28 NOTE — Telephone Encounter (Signed)
Jacqueline Wyatt, as you can see, I had gone ahead and appealed the denial, and I just got a call from the Appeals Dept asking some add'l questions. Although I had asked for any formulary muscle relaxant alternatives previously, and only been given NSAIDS, the Appeals rep said that Zanaflex is on formulary, and the questions are 1. Would the zanaflex be less effective for pt than metaxalone, or 2. Would the zanaflex be more likely to cause adverse SEs than the metaxalone or be contraindicated for some reason? They are going to fax these questions, but wanted to go ahead and get the answers from you. Please advise.

## 2015-10-29 NOTE — Telephone Encounter (Signed)
We received a form from ins asking the questions below. I have put the form in Nicole's box for review, and highlighted the missing info.

## 2015-10-30 MED ORDER — TIZANIDINE HCL 2 MG PO CAPS
2.0000 mg | ORAL_CAPSULE | Freq: Three times a day (TID) | ORAL | Status: DC | PRN
Start: 2015-10-30 — End: 2016-08-17

## 2015-10-30 NOTE — Telephone Encounter (Signed)
[employer] called to check if we got the form and to advise it is due back by end of day, or appeal will be denied. Since Jacqueline Wyatt is not in office today, I have put the form in [name]'s box and am sending this to PA pool.

## 2015-10-30 NOTE — Telephone Encounter (Signed)
I think that we need to try the Zanaflex. If it doesn't work for her, or if she has adverse effects, then it sounds like we will be able to get the metaxalone covered.  I've completed the form and returned it to the nurses' box.  Meds ordered this encounter  Medications  . tizanidine (ZANAFLEX) 2 MG capsule    Sig: Take 1 capsule (2 mg total) by mouth 3 (three) times daily as needed for muscle spasms.    Dispense:  30 capsule    Refill:  0    Order Specific Question:  Supervising Provider    Answer:  DOOLITTLE, ROBERT P [[id number]]

## 2015-10-30 NOTE — Telephone Encounter (Signed)
Sent in appeal form, however, agree that it will be denied. Called pt and explained about new Rx for trial of Zanaflex. She agreed to try and CB if it is not effective or causes SEs.

## 2015-11-14 ENCOUNTER — Encounter: Payer: Self-pay | Admitting: Family Medicine

## 2015-11-19 ENCOUNTER — Encounter: Payer: Self-pay | Admitting: Family Medicine

## 2016-04-26 ENCOUNTER — Other Ambulatory Visit: Payer: Self-pay | Admitting: Physician Assistant

## 2016-08-17 ENCOUNTER — Ambulatory Visit (INDEPENDENT_AMBULATORY_CARE_PROVIDER_SITE_OTHER): Payer: Medicare Other | Admitting: Physician Assistant

## 2016-08-17 VITALS — BP 120/74 | HR 74 | Temp 98.3°F | Resp 18 | Ht 67.0 in | Wt 192.0 lb

## 2016-08-17 DIAGNOSIS — M5412 Radiculopathy, cervical region: Secondary | ICD-10-CM | POA: Diagnosis not present

## 2016-08-17 DIAGNOSIS — E039 Hypothyroidism, unspecified: Secondary | ICD-10-CM | POA: Diagnosis not present

## 2016-08-17 MED ORDER — LEVOTHYROXINE SODIUM 50 MCG PO TABS: 50.0000 ug | ORAL_TABLET | Freq: Every day | ORAL | 1 refills | Status: DC

## 2016-08-17 NOTE — Patient Instructions (Addendum)
Please take your normal dose of aspirin for your neck and arm pain. (no more than 750 mg daily.)  Please call in one week if this is not helping your symptoms.      IF you received an x-ray today, you will receive an invoice from [facility]. Please contact [facility] at [phone] with questions or concerns regarding your invoice.   IF you received labwork today, you will receive an invoice from [facility]. Please contact [facility] at [phone] with questions or concerns regarding your invoice.   Our billing staff will not be able to assist you with questions regarding bills from these companies.  You will be contacted with the lab results as soon as they are available. The fastest way to get your results is to activate your My Chart account. Instructions are located on the last page of this paperwork. If you have not heard from us regarding the results in 2 weeks, please contact this office.

## 2016-08-17 NOTE — Progress Notes (Addendum)
By signing my name below, I, Stann Ore, attest that this documentation has been prepared under the direction and in the presence of Deliah Boston, PA-C. Electronically Signed: Stann Ore, Scribe. 08/18/2016 , 4:14 PM .  Patient was seen in Room 2 .  08/18/2016 1:32 PM   DOB: 1946/05/11 / MRN: [id number]  SUBJECTIVE:  Jacqueline Wyatt is a 70 y.o. female presenting for left arm pain that started this morning. Patient states she woke up this morning with a pinching pain in her left deltoid. She mentions having a tingling sensation radiating down her left arm. She took a dose of tramadol with some relief, but the pain returned. She took a second dose of tramadol for more relief. She had similar symptoms in the past, and taken a tramadol for relief. She denies rash, urinary or bowel incontinence. She has history of osteoporosis and fibromyalgia (diagnosed in 2002). She notes tylenol "makes her feel bad", and her "go-to medication" is aspirin.   She also informs not taking her levothyroxine in a week now. She would like a refill. She plans to return for an annual at a later time.   She is allergic to almotriptan malate; eggs or egg-derived products; imitrex [sumatriptan base]; penicillins; relpax [eletriptan hydrobromide]; sulfa drugs cross reactors; vantin; and azithromycin.   She  has a past medical history of Allergy; Arthritis; Asthma; Fibromyalgia; Fibromyalgia muscle pain; Neuromuscular disorder (HCC); Osteoporosis; and Thyroid disease.    She  reports that she has never smoked. She has never used smokeless tobacco. She  has no sexual activity history on file. The patient  has no past surgical history on file.  Her family history includes Arthritis in her sister; Cancer in her maternal grandmother; Hearing loss in her mother; Heart disease in her maternal grandfather, maternal uncle, and mother; Hyperlipidemia in her mother; Hypertension in her mother; Kidney disease in her mother; Lung  disease in her mother; Meniere's disease in her brother; Stroke in her father and maternal grandmother; Vision loss in her mother.  Review of Systems  Constitutional: Negative for fever.  Musculoskeletal: Positive for myalgias.  Skin: Negative for rash.  Neurological: Positive for tingling. Negative for dizziness and focal weakness.    The problem list and medications were reviewed and updated by myself where necessary and exist elsewhere in the encounter.   OBJECTIVE:  BP 120/74 (BP Location: Right Arm, Patient Position: Sitting, Cuff Size: Small)    Pulse 74    Temp 98.3 F (36.8 C) (Oral)    Resp 18    Ht 5\' 7"  (1.702 m)    Wt 192 lb (87.1 kg)    SpO2 96%    BMI 30.07 kg/m   Physical Exam  Constitutional: She is oriented to person, place, and time. Vital signs are normal. She appears well-developed.  Cardiovascular: Normal rate, regular rhythm and normal heart sounds.   No murmur heard. Pulmonary/Chest: Effort normal and breath sounds normal. No respiratory distress.  Musculoskeletal: Normal range of motion.  Neurological: She is alert and oriented to person, place, and time.  Reflex Scores:      Tricep reflexes are 2+ on the right side and 2+ on the left side.      Bicep reflexes are 2+ on the right side and 2+ on the left side.      Brachioradialis reflexes are 2+ on the right side and 2+ on the left side. No loss of strength in her hands, C5, c6 c7 and t1 intact to soft  touch  Skin: Skin is warm and dry.  Psychiatric: She has a normal mood and affect. Her behavior is normal. Judgment and thought content normal.    No results found for this or any previous visit (from the past 72 hour(s)).  No results found.  ASSESSMENT AND PLAN  Jacqueline Wyatt was seen today for arm pain and foot pain.  Diagnoses and all orders for this visit:  Cervical neuralgia: Per HPI she has a history of same and reports she started to get worse today.  She has not red flags.  I don't think she would  benefit from prescription medication at this time and I advised tylenol, which she did not want to do because this "makes her feels weird."  Advised that she follow her home regimen for pain and if not improving in a week to call and will consider a prescription medication, such as a short course of naprosyn or prednisone.   Hypothyroidism, unspecified type: Refilling today.  Will avoid blood draw today as she has not been taking her medication for about 1 week.  -     levothyroxine (SYNTHROID, LEVOTHROID) 50 MCG tablet; Take 1 tablet (50 mcg total) by mouth daily.     The patient is advised to call or return to clinic if she does not see an improvement in symptoms, or to seek the care of the closest emergency department if she worsens with the above plan.   This note was scribed in my presence and I performed the services described in the this documentation.   Deliah BostonMichael Clark, MHS, PA-C [facility] 08/18/2016 1:32 PM

## 2016-11-13 ENCOUNTER — Ambulatory Visit (INDEPENDENT_AMBULATORY_CARE_PROVIDER_SITE_OTHER): Payer: Medicare Other | Admitting: Family Medicine

## 2016-11-13 VITALS — BP 130/82 | HR 71 | Temp 98.1°F | Resp 17 | Ht 67.0 in | Wt 193.0 lb

## 2016-11-13 DIAGNOSIS — R5383 Other fatigue: Secondary | ICD-10-CM | POA: Diagnosis not present

## 2016-11-13 DIAGNOSIS — M797 Fibromyalgia: Secondary | ICD-10-CM

## 2016-11-13 DIAGNOSIS — E039 Hypothyroidism, unspecified: Secondary | ICD-10-CM | POA: Diagnosis not present

## 2016-11-13 MED ORDER — TRAMADOL HCL 50 MG PO TABS: 50.0000 mg | ORAL_TABLET | Freq: Three times a day (TID) | ORAL | 0 refills | Status: DC | PRN

## 2016-11-13 NOTE — Progress Notes (Signed)
Patient ID: Jacqueline RobertsJudith Wyatt, female    DOB: [birth year], 71 y.o.   MRN: [id number]  PCP: Abbe AmsterdamOPLAND,JESSICA, MD  Chief Complaint  Patient presents with  . Medication Refill    synthroid,ultram    Subjective:  HPI  71 year old female presents for evaluation of thyroid functions and medication refill of ultram for treatment of fibromyalgia.  Thyroid  Taking 50 mcg Synthroid as prescribed daily. Last TSH was more than 12 months ago. She would like a Thyroid function test.  Complains of fatigue although feels excessive tiredness is more related to fibromyalgia oppose to thyroid functioning. Going up stairs and prolonged walking causes increased fatigue.  Fibromyalgia Reports increased fatigue due to illness.  In comparison to overall chronic pain she reports that fatigue is her most worrisome and debilitating symptom.  Social History   Social History  . Marital status: Unknown    Spouse name: N/A  . Number of children: N/A  . Years of education: N/A   Occupational History  . Not on file.   Social History Main Topics  . Smoking status: Never Smoker  . Smokeless tobacco: Never Used  . Alcohol use Not on file  . Drug use: Unknown  . Sexual activity: Not on file   Other Topics Concern  . Not on file   Social History Narrative  . No narrative on file    Family History  Problem Relation Age of Onset  . Heart disease Mother   . Hypertension Mother   . Hyperlipidemia Mother   . Hearing loss Mother   . Kidney disease Mother   . Vision loss Mother   . Lung disease Mother   . Stroke Father   . Arthritis Sister   . Meniere's disease Brother   . Cancer Maternal Grandmother   . Stroke Maternal Grandmother   . Heart disease Maternal Grandfather   . Heart disease Maternal Uncle    Review of Systems   See HPI  Patient Active Problem List   Diagnosis Date Noted  . Screening for colon cancer 10/31/2014  . Generalized anxiety disorder 09/09/2014  . Hypothyroid 11/24/2011    . Migraine 11/24/2011  . Environmental allergies 11/24/2011  . Fibromyalgia 11/24/2011  . Irritable bowel syndrome (IBS) 11/24/2011  . Osteoporosis 11/24/2011    Allergies  Allergen Reactions  . Almotriptan Malate     Chest pain  . Eggs Or Egg-Derived Products     Stomach pain  . Imitrex [Sumatriptan Base]   . Penicillins   . Relpax [Eletriptan Hydrobromide] Hypertension  . Sulfa Drugs Cross Reactors   . Vantin     dizziness  . Azithromycin Anxiety    Prior to Admission medications   Medication Sig Start Date End Date Taking? Authorizing Provider  aspirin 81 MG tablet Take 81 mg by mouth daily.   Yes Historical Provider, MD  Biotin 1 MG CAPS Take by mouth.   Yes Historical Provider, MD  calcium-vitamin D (OSCAL WITH D) 500-200 MG-UNIT per tablet Take 1 tablet by mouth.   Yes Historical Provider, MD  co-enzyme Q-10 30 MG capsule Take 30 mg by mouth 3 (three) times daily.   Yes Historical Provider, MD  levothyroxine (SYNTHROID, LEVOTHROID) 50 MCG tablet Take 1 tablet (50 mcg total) by mouth daily. 08/17/16  Yes Ofilia NeasMichael L Clark, PA-C  loratadine (CLARITIN) 10 MG tablet Take 10 mg by mouth daily.   Yes Historical Provider, MD  traMADol (ULTRAM) 50 MG tablet Take 1 tablet (50 mg total) by  mouth as needed. 10/16/15  Yes Dorna Leitz PA-C    Past Medical, Surgical Family and Social History reviewed and updated.    Objective:   Today's Vitals   11/13/16 1013  BP: 130/82  Pulse: 71  Resp: 17  Temp: 98.1 F (36.7 C)  TempSrc: Oral  SpO2: 97%  Weight: 193 lb (87.5 kg)  Height: 5\' 7"  (1.702 m)    Wt Readings from Last 3 Encounters:  11/13/16 193 lb (87.5 kg)  08/17/16 192 lb (87.1 kg)  10/16/15 184 lb (83.5 kg)   Physical Exam  Constitutional: She is oriented to person, place, and time. She appears well-developed and well-nourished.  HENT:  Head: Normocephalic and atraumatic.  Right Ear: External ear normal.  Left Ear: External ear normal.  Nose: Nose normal.   Mouth/Throat: Oropharynx is clear and moist.  Eyes: Pupils are equal, round, and reactive to light.  Neck: Normal range of motion. Neck supple.  Cardiovascular: Normal rate, regular rhythm and normal heart sounds.   Pulmonary/Chest: Effort normal and breath sounds normal.  Musculoskeletal: Normal range of motion.  Neurological: She is alert and oriented to person, place, and time.  Skin: Skin is warm and dry.  Psychiatric: She has a normal mood and affect. Her behavior is normal. Judgment and thought content normal.     Assessment & Plan:  1. Fatigue, unspecified type - Thyroid Panel With TSH - Vitamin D, 25-hydroxy - Comprehensive metabolic panel - CBC with Differential/Platelet  2. Fibromyalgia. Stable -Continue to take frequent rest periods when performing daily tasks to conserve energy. -Screening thyroid level today.  3. Hypothyroidism, unspecified type - Comprehensive metabolic panel - CBC with Differential/Platelet - levothyroxine (SYNTHROID, LEVOTHROID) 50 MCG tablet; Take 1 tablet (50 mcg total) by mouth daily.  Dispense: 90 tablet; Refill: 3  Follow-up as needed.  Godfrey Pick. Tiburcio Pea, MSN, FNP-C [facility] [phone]

## 2016-11-13 NOTE — Patient Instructions (Addendum)
I will contact you regarding your lab results and Synthroid refill.   IF you received an x-ray today, you will receive an invoice from [facility]. Please contact [facility] at [phone] with questions or concerns regarding your invoice.   IF you received labwork today, you will receive an invoice from [facility]. Please contact [facility] at [phone] with questions or concerns regarding your invoice.   Our billing staff will not be able to assist you with questions regarding bills from these companies.  You will be contacted with the lab results as soon as they are available. The fastest way to get your results is to activate your My Chart account. Instructions are located on the last page of this paperwork. If you have not heard from us regarding the results in 2 weeks, please contact this office.

## 2016-11-14 MED ORDER — LEVOTHYROXINE SODIUM 50 MCG PO TABS: 50.0000 ug | ORAL_TABLET | Freq: Every day | ORAL | 3 refills | Status: DC

## 2016-11-15 LAB — CBC WITH DIFFERENTIAL/PLATELET
BASOS: 1 %
Basophils Absolute: 0 10*3/uL (ref 0.0–0.2)
EOS (ABSOLUTE): 0.3 10*3/uL (ref 0.0–0.4)
EOS: 6 %
Hematocrit: 43 % (ref 34.0–46.6)
Hemoglobin: 14.6 g/dL (ref 11.1–15.9)
IMMATURE GRANULOCYTES: 0 %
Immature Grans (Abs): 0 10*3/uL (ref 0.0–0.1)
Lymphocytes Absolute: 2 10*3/uL (ref 0.7–3.1)
Lymphs: 41 %
MCH: 33 pg (ref 26.6–33.0)
MCHC: 34 g/dL (ref 31.5–35.7)
MCV: 97 fL (ref 79–97)
MONOS ABS: 0.5 10*3/uL (ref 0.1–0.9)
Monocytes: 10 %
NEUTROS ABS: 2 10*3/uL (ref 1.4–7.0)
NEUTROS PCT: 42 %
PLATELETS: 280 10*3/uL (ref 150–379)
RBC: 4.43 x10E6/uL (ref 3.77–5.28)
RDW: 13.7 % (ref 12.3–15.4)
WBC: 4.8 10*3/uL (ref 3.4–10.8)

## 2016-11-15 LAB — COMPREHENSIVE METABOLIC PANEL
A/G RATIO: 1.5 (ref 1.2–2.2)
ALBUMIN: 4.1 g/dL (ref 3.5–4.8)
ALT: 19 IU/L (ref 0–32)
AST: 23 IU/L (ref 0–40)
Alkaline Phosphatase: 82 IU/L (ref 39–117)
BUN / CREAT RATIO: 18 (ref 12–28)
BUN: 16 mg/dL (ref 8–27)
Bilirubin Total: 0.3 mg/dL (ref 0.0–1.2)
CALCIUM: 9.2 mg/dL (ref 8.7–10.3)
CO2: 23 mmol/L (ref 18–29)
CREATININE: 0.91 mg/dL (ref 0.57–1.00)
Chloride: 100 mmol/L (ref 96–106)
GFR, EST AFRICAN AMERICAN: 74 mL/min/{1.73_m2} (ref >59)
GFR, EST NON AFRICAN AMERICAN: 64 mL/min/{1.73_m2} (ref >59)
GLOBULIN, TOTAL: 2.7 g/dL (ref 1.5–4.5)
Glucose: 91 mg/dL (ref 65–99)
POTASSIUM: 4.7 mmol/L (ref 3.5–5.2)
SODIUM: 139 mmol/L (ref 134–144)
TOTAL PROTEIN: 6.8 g/dL (ref 6.0–8.5)

## 2016-11-15 LAB — VITAMIN D 25 HYDROXY (VIT D DEFICIENCY, FRACTURES): Vit D, 25-Hydroxy: 14.7 ng/mL — ABNORMAL LOW (ref 30.0–100.0)

## 2016-11-15 LAB — THYROID PANEL WITH TSH
FREE THYROXINE INDEX: 1.6 (ref 1.2–4.9)
T3 Uptake Ratio: 26 % (ref 24–39)
T4, Total: 6.2 ug/dL (ref 4.5–12.0)
TSH: 3.76 u[IU]/mL (ref 0.450–4.500)

## 2016-11-15 MED ORDER — VITAMIN D (ERGOCALCIFEROL) 1.25 MG (50000 UNIT) PO CAPS: 50000.0000 [IU] | ORAL_CAPSULE | ORAL | 12 refills | Status: DC

## 2017-01-10 ENCOUNTER — Other Ambulatory Visit: Payer: Self-pay | Admitting: Family Medicine

## 2017-01-12 NOTE — Telephone Encounter (Signed)
Rx called to [facility] [address] [phone] IC pt & advised rx called in.

## 2017-02-10 ENCOUNTER — Other Ambulatory Visit: Payer: Self-pay | Admitting: Physician Assistant

## 2017-02-10 DIAGNOSIS — E039 Hypothyroidism, unspecified: Secondary | ICD-10-CM

## 2017-02-10 NOTE — Telephone Encounter (Signed)
Refills on tramadol and on lexothyroxine.

## 2017-02-11 MED ORDER — LEVOTHYROXINE SODIUM 50 MCG PO TABS: 50.0000 ug | ORAL_TABLET | Freq: Every day | ORAL | 3 refills | Status: DC

## 2017-02-11 NOTE — Telephone Encounter (Signed)
10/2016 last ov 

## 2017-02-11 NOTE — Telephone Encounter (Signed)
Please call patient and let her know I have refill her thyroid medication but she does not have a pharmacy listed so she will have to pick up the Rx here or we can fax it if she knows which pharmacy she wants it filled at. In terms of tramadol, I have refused the refill request. Per our policy, it is a controlled substance and she will need to establish care with a PCP to have this prescribed. Please tell her to contact our office to schedule an appointment for this medication. Thanks.

## 2017-02-12 NOTE — Telephone Encounter (Signed)
[facility] [address] called in thyroid med tx up front for an appt for other

## 2017-02-14 ENCOUNTER — Ambulatory Visit (INDEPENDENT_AMBULATORY_CARE_PROVIDER_SITE_OTHER): Payer: Medicare Other | Admitting: Emergency Medicine

## 2017-02-14 VITALS — BP 128/66 | HR 80 | Temp 98.4°F | Resp 16 | Ht 65.5 in | Wt 194.8 lb

## 2017-02-14 DIAGNOSIS — R5383 Other fatigue: Secondary | ICD-10-CM

## 2017-02-14 DIAGNOSIS — M797 Fibromyalgia: Secondary | ICD-10-CM | POA: Diagnosis not present

## 2017-02-14 DIAGNOSIS — G894 Chronic pain syndrome: Secondary | ICD-10-CM | POA: Diagnosis not present

## 2017-02-14 MED ORDER — TRAMADOL HCL 50 MG PO TABS: 50.0000 mg | ORAL_TABLET | Freq: Two times a day (BID) | ORAL | 2 refills | Status: AC | PRN

## 2017-02-14 NOTE — Progress Notes (Signed)
Jacqueline Wyatt 71 y.o.   Chief Complaint  Patient presents with  . Medication Refill    Tramadol 50 mg    HISTORY OF PRESENT ILLNESS: This is a 71 y.o. female with h/o fibromyalgia and chronic pain, ran out of Tramadol; takes  bid and has been taking it x the past 10 years. No new symptomatology.  HPI   Prior to Admission medications   Medication Sig Start Date End Date Taking? Authorizing Provider  aspirin 81 MG tablet Take 81 mg by mouth daily.   Yes Historical Provider, MD  levothyroxine (SYNTHROID, LEVOTHROID) 50 MCG tablet Take 1 tablet (50 mcg total) by mouth daily. 02/11/17  Yes Magdalene River, PA-C  loratadine (CLARITIN) 10 MG tablet Take 10 mg by mouth daily.   Yes Historical Provider, MD  Vitamin D, Ergocalciferol, (DRISDOL) 50000 units CAPS capsule Take 1 capsule (50,000 Units total) by mouth every 7 (seven) days. 11/15/16  Yes Doyle Askew, FNP  Biotin 1 MG CAPS Take by mouth.    Historical Provider, MD  calcium-vitamin D (OSCAL WITH D) 500-200 MG-UNIT per tablet Take 1 tablet by mouth.    Historical Provider, MD  co-enzyme Q-10 30 MG capsule Take 30 mg by mouth 3 (three) times daily.    Historical Provider, MD  traMADol (ULTRAM) 50 MG tablet Take 1 tablet (50 mg total) by mouth 2 (two) times daily as needed. 02/14/17 03/16/17  Georgina Quint, MD    Allergies  Allergen Reactions  . Almotriptan Malate     Chest pain  . Eggs Or Egg-Derived Products     Stomach pain  . Imitrex [Sumatriptan Base]   . Penicillins   . Relpax [Eletriptan Hydrobromide] Hypertension  . Sulfa Drugs Cross Reactors   . Vantin     dizziness  . Azithromycin Anxiety    Patient Active Problem List   Diagnosis Date Noted  . Screening for colon cancer 10/31/2014  . Generalized anxiety disorder 09/09/2014  . Hypothyroid 11/24/2011  . Migraine 11/24/2011  . Environmental allergies 11/24/2011  . Fibromyalgia 11/24/2011  . Irritable bowel syndrome (IBS) 11/24/2011  .  Osteoporosis 11/24/2011    Past Medical History:  Diagnosis Date  . Allergy   . Arthritis   . Asthma   . Fibromyalgia   . Fibromyalgia muscle pain   . Neuromuscular disorder (HCC)   . Osteoporosis   . Thyroid disease     No past surgical history on file.  Social History   Social History  . Marital status: Unknown    Spouse name: N/A  . Number of children: N/A  . Years of education: N/A   Occupational History  . Not on file.   Social History Main Topics  . Smoking status: Never Smoker  . Smokeless tobacco: Never Used  . Alcohol use Not on file  . Drug use: Unknown  . Sexual activity: Not on file   Other Topics Concern  . Not on file   Social History Narrative  . No narrative on file    Family History  Problem Relation Age of Onset  . Heart disease Mother   . Hypertension Mother   . Hyperlipidemia Mother   . Hearing loss Mother   . Kidney disease Mother   . Vision loss Mother   . Lung disease Mother   . Stroke Father   . Arthritis Sister   . Meniere's disease Brother   . Cancer Maternal Grandmother   . Stroke Maternal Grandmother   .  Heart disease Maternal Grandfather   . Heart disease Maternal Uncle      Review of Systems  Constitutional: Positive for malaise/fatigue. Negative for chills, fever and weight loss.  HENT: Negative.  Negative for nosebleeds and sore throat.   Eyes: Negative.  Negative for blurred vision and double vision.  Respiratory: Negative.  Negative for cough and shortness of breath.   Cardiovascular: Negative.  Negative for chest pain, palpitations and leg swelling.  Gastrointestinal: Negative.  Negative for abdominal pain, diarrhea, nausea and vomiting.  Genitourinary: Negative.  Negative for dysuria and hematuria.  Musculoskeletal: Positive for back pain, joint pain and myalgias.  Skin: Negative.  Negative for itching and rash.  Neurological: Positive for weakness. Negative for dizziness, sensory change, focal weakness and  headaches.  Endo/Heme/Allergies: Negative.   All other systems reviewed and are negative.  Vitals:   02/14/17 1352  BP: 128/66  Pulse: 80  Resp: 16  Temp: 98.4 F (36.9 C)     Physical Exam  Constitutional: She is oriented to person, place, and time. She appears well-developed and well-nourished.  HENT:  Head: Normocephalic and atraumatic.  Nose: Nose normal.  Mouth/Throat: Oropharynx is clear and moist. No oropharyngeal exudate.  Eyes: Conjunctivae and EOM are normal. Pupils are equal, round, and reactive to light.  Neck: Normal range of motion. Neck supple. No JVD present. No thyromegaly present.  Cardiovascular: Normal rate, regular rhythm, normal heart sounds and intact distal pulses.   Pulmonary/Chest: Effort normal and breath sounds normal.  Abdominal: Soft. Bowel sounds are normal. She exhibits no distension. There is no tenderness.  Musculoskeletal: Normal range of motion.  Lymphadenopathy:    She has no cervical adenopathy.  Neurological: She is alert and oriented to person, place, and time. No sensory deficit. She exhibits normal muscle tone.  Skin: Skin is warm and dry. Capillary refill takes less than 2 seconds. No rash noted.  Psychiatric: She has a normal mood and affect. Her behavior is normal.  Vitals reviewed.    ASSESSMENT & PLAN: [name] was seen today for medication refill.  Diagnoses and all orders for this visit:  Fibromyalgia  Fatigue, unspecified type  Chronic pain syndrome  Other orders -     traMADol (ULTRAM) 50 MG tablet; Take 1 tablet (50 mg total) by mouth 2 (two) times daily as needed.    Patient Instructions       IF you received an x-ray today, you will receive an invoice from [facility]. Please contact [facility] at [phone] with questions or concerns regarding your invoice.   IF you received labwork today, you will receive an invoice from [facility]. Please contact [facility] at [phone] with questions  or concerns regarding your invoice.   Our billing staff will not be able to assist you with questions regarding bills from these companies.  You will be contacted with the lab results as soon as they are available. The fastest way to get your results is to activate your My Chart account. Instructions are located on the last page of this paperwork. If you have not heard from [place] regarding the results in 2 weeks, please contact this office.     Myofascial Pain Syndrome and Fibromyalgia Myofascial pain syndrome and fibromyalgia are both pain disorders. This pain may be felt mainly in your muscles.  Myofascial pain syndrome:  Always has trigger points or tender points in the muscle that will cause pain when pressed. The pain may come and go.  Usually affects your neck, upper  back, and shoulder areas. The pain often radiates into your arms and hands.  Fibromyalgia:  Has muscle pains and tenderness that come and go.  Is often associated with fatigue and sleep disturbances.  Has trigger points.  Tends to be long-lasting (chronic), but is not life-threatening. Fibromyalgia and myofascial pain are not the same. However, they often occur together. If you have both conditions, each can make the other worse. Both are common and can cause enough pain and fatigue to make day-to-day activities difficult. What are the causes? The exact causes of fibromyalgia and myofascial pain are not known. People with certain gene types may be more likely to develop fibromyalgia. Some factors can be triggers for both conditions, such as:  Spine disorders.  Arthritis.  Severe injury (trauma) and other physical stressors.  Being under a lot of stress.  A medical illness. What are the signs or symptoms? Fibromyalgia  The main symptom of fibromyalgia is widespread pain and tenderness in your muscles. This can vary over time. Pain is sometimes described as stabbing, shooting, or burning. You may have tingling  or numbness, too. You may also have sleep problems and fatigue. You may wake up feeling tired and groggy (fibro fog). Other symptoms may include:  Bowel and bladder problems.  Headaches.  Visual problems.  Problems with odors and noises.  Depression or mood changes.  Painful menstrual periods (dysmenorrhea).  Dry skin or eyes. Myofascial pain syndrome  Symptoms of myofascial pain syndrome include:  Tight, ropy bands of muscle.  Uncomfortable sensations in muscular areas, such as:  Aching.  Cramping.  Burning.  Numbness.  Tingling.  Muscle weakness.  Trouble moving certain muscles freely (range of motion). How is this diagnosed? There are no specific tests to diagnose fibromyalgia or myofascial pain syndrome. Both can be hard to diagnose because their symptoms are common in many other conditions. Your health care provider may suspect one or both of these conditions based on your symptoms and medical history. Your health care provider will also do a physical exam. The key to diagnosing fibromyalgia is having pain, fatigue, and other symptoms for more than three months that cannot be explained by another condition. The key to diagnosing myofascial pain syndrome is finding trigger points in muscles that are tender and cause pain elsewhere in your body (referred pain). How is this treated? Treating fibromyalgia and myofascial pain often requires a team of health care providers. This usually starts with your primary provider and a physical therapist. You may also find it helpful to work with alternative health care providers, such as massage therapists or acupuncturists. Treatment for fibromyalgia may include medicines. This may include nonsteroidal anti-inflammatory drugs (NSAIDs), along with other medicines. Treatment for myofascial pain may also include:  NSAIDs.  Cooling and stretching of muscles.  Trigger point injections.  Sound wave (ultrasound) treatments to  stimulate muscles. Follow these instructions at home:  Take medicines only as directed by your health care provider.  Exercise as directed by your health care provider or physical therapist.  Try to avoid stressful situations.  Practice relaxation techniques to control your stress. You may want to try:  Biofeedback.  Visual imagery.  Hypnosis.  Muscle relaxation.  Yoga.  Meditation.  Talk to your health care provider about alternative treatments, such as acupuncture or massage treatment.  Maintain a healthy lifestyle. This includes eating a healthy diet and getting enough sleep.  Consider joining a support group.  Do not do activities that stress or strain your muscles.  That includes repetitive motions and heavy lifting. Where to find more information:  National Fibromyalgia Association: www.fmaware.org  Arthritis Foundation: www.arthritis.org  American Chronic Pain Association: GumSearch.nl Contact a health care provider if:  You have new symptoms.  Your symptoms get worse.  You have side effects from your medicines.  You have trouble sleeping.  Your condition is causing depression or anxiety. This information is not intended to replace advice given to you by your health care provider. Make sure you discuss any questions you have with your health care provider. Document Released: 10/11/2005 Document Revised: 03/18/2016 Document Reviewed: 07/17/2014 Elsevier Interactive Patient Education  2017 Elsevier Inc.      Edwina Barth, MD [facility]

## 2017-02-14 NOTE — Patient Instructions (Addendum)
   IF you received an x-ray today, you will receive an invoice from [facility]. Please contact [facility] at [phone] with questions or concerns regarding your invoice.   IF you received labwork today, you will receive an invoice from [facility]. Please contact [facility] at [phone] with questions or concerns regarding your invoice.   Our billing staff will not be able to assist you with questions regarding bills from these companies.  You will be contacted with the lab results as soon as they are available. The fastest way to get your results is to activate your My Chart account. Instructions are located on the last page of this paperwork. If you have not heard from us regarding the results in 2 weeks, please contact this office.      Myofascial Pain Syndrome and Fibromyalgia Myofascial pain syndrome and fibromyalgia are both pain disorders. This pain may be felt mainly in your muscles.  Myofascial pain syndrome:  Always has trigger points or tender points in the muscle that will cause pain when pressed. The pain may come and go.  Usually affects your neck, upper back, and shoulder areas. The pain often radiates into your arms and hands.  Fibromyalgia:  Has muscle pains and tenderness that come and go.  Is often associated with fatigue and sleep disturbances.  Has trigger points.  Tends to be long-lasting (chronic), but is not life-threatening. Fibromyalgia and myofascial pain are not the same. However, they often occur together. If you have both conditions, each can make the other worse. Both are common and can cause enough pain and fatigue to make day-to-day activities difficult. What are the causes? The exact causes of fibromyalgia and myofascial pain are not known. People with certain gene types may be more likely to develop fibromyalgia. Some factors can be triggers for both conditions, such as:  Spine disorders.  Arthritis.  Severe injury  (trauma) and other physical stressors.  Being under a lot of stress.  A medical illness. What are the signs or symptoms? Fibromyalgia  The main symptom of fibromyalgia is widespread pain and tenderness in your muscles. This can vary over time. Pain is sometimes described as stabbing, shooting, or burning. You may have tingling or numbness, too. You may also have sleep problems and fatigue. You may wake up feeling tired and groggy (fibro fog). Other symptoms may include:  Bowel and bladder problems.  Headaches.  Visual problems.  Problems with odors and noises.  Depression or mood changes.  Painful menstrual periods (dysmenorrhea).  Dry skin or eyes. Myofascial pain syndrome  Symptoms of myofascial pain syndrome include:  Tight, ropy bands of muscle.  Uncomfortable sensations in muscular areas, such as:  Aching.  Cramping.  Burning.  Numbness.  Tingling.  Muscle weakness.  Trouble moving certain muscles freely (range of motion). How is this diagnosed? There are no specific tests to diagnose fibromyalgia or myofascial pain syndrome. Both can be hard to diagnose because their symptoms are common in many other conditions. Your health care provider may suspect one or both of these conditions based on your symptoms and medical history. Your health care provider will also do a physical exam. The key to diagnosing fibromyalgia is having pain, fatigue, and other symptoms for more than three months that cannot be explained by another condition. The key to diagnosing myofascial pain syndrome is finding trigger points in muscles that are tender and cause pain elsewhere in your body (referred pain). How is this treated? Treating fibromyalgia and myofascial pain   often requires a team of health care providers. This usually starts with your primary provider and a physical therapist. You may also find it helpful to work with alternative health care providers, such as massage therapists  or acupuncturists. Treatment for fibromyalgia may include medicines. This may include nonsteroidal anti-inflammatory drugs (NSAIDs), along with other medicines. Treatment for myofascial pain may also include:  NSAIDs.  Cooling and stretching of muscles.  Trigger point injections.  Sound wave (ultrasound) treatments to stimulate muscles. Follow these instructions at home:  Take medicines only as directed by your health care provider.  Exercise as directed by your health care provider or physical therapist.  Try to avoid stressful situations.  Practice relaxation techniques to control your stress. You may want to try:  Biofeedback.  Visual imagery.  Hypnosis.  Muscle relaxation.  Yoga.  Meditation.  Talk to your health care provider about alternative treatments, such as acupuncture or massage treatment.  Maintain a healthy lifestyle. This includes eating a healthy diet and getting enough sleep.  Consider joining a support group.  Do not do activities that stress or strain your muscles. That includes repetitive motions and heavy lifting. Where to find more information:  National Fibromyalgia Association: www.fmaware.org  Arthritis Foundation: www.arthritis.org  American Chronic Pain Association: www.theacpa.org/condition/myofascial-pain Contact a health care provider if:  You have new symptoms.  Your symptoms get worse.  You have side effects from your medicines.  You have trouble sleeping.  Your condition is causing depression or anxiety. This information is not intended to replace advice given to you by your health care provider. Make sure you discuss any questions you have with your health care provider. Document Released: 10/11/2005 Document Revised: 03/18/2016 Document Reviewed: 07/17/2014 Elsevier Interactive Patient Education  2017 Elsevier Inc.  

## 2017-08-05 ENCOUNTER — Encounter: Payer: Self-pay | Admitting: Emergency Medicine

## 2017-08-05 ENCOUNTER — Ambulatory Visit (INDEPENDENT_AMBULATORY_CARE_PROVIDER_SITE_OTHER): Payer: Medicare Other | Admitting: Emergency Medicine

## 2017-08-05 VITALS — BP 126/78 | HR 65 | Temp 98.2°F | Resp 16 | Ht 66.0 in | Wt 188.0 lb

## 2017-08-05 DIAGNOSIS — G894 Chronic pain syndrome: Secondary | ICD-10-CM | POA: Insufficient documentation

## 2017-08-05 DIAGNOSIS — E039 Hypothyroidism, unspecified: Secondary | ICD-10-CM | POA: Diagnosis not present

## 2017-08-05 DIAGNOSIS — E559 Vitamin D deficiency, unspecified: Secondary | ICD-10-CM

## 2017-08-05 DIAGNOSIS — M797 Fibromyalgia: Secondary | ICD-10-CM | POA: Diagnosis not present

## 2017-08-05 HISTORY — DX: Chronic pain syndrome: G89.4

## 2017-08-05 MED ORDER — TRAMADOL HCL 50 MG PO TABS: 50.0000 mg | ORAL_TABLET | Freq: Two times a day (BID) | ORAL | 3 refills | Status: DC

## 2017-08-05 NOTE — Progress Notes (Signed)
Jacqueline Wyatt 71 y.o.   Chief Complaint  Patient presents with  . Medication Refill    Tramadol    HISTORY OF PRESENT ILLNESS: This is a 71 y.o. female with h/o fibromyalgia started on Tramadol prn 10 years ago at this office.Feels OK but needs medication refill. Has no complaints.  Pertinent labs & imaging results that were available during my care of the patient were reviewed by me and considered in my medical decision making (see chart for details).   HPI   Prior to Admission medications   Medication Sig Start Date End Date Taking? Authorizing Provider  aspirin 81 MG tablet Take 81 mg by mouth daily.   Yes [provider]  calcium-vitamin D (OSCAL WITH D) 500-200 MG-UNIT per tablet Take 1 tablet by mouth.   Yes [provider]  co-enzyme Q-10 30 MG capsule Take 30 mg by mouth 3 (three) times daily.   Yes [provider]  levothyroxine (SYNTHROID, LEVOTHROID) 50 MCG tablet Take 1 tablet (50 mcg total) by mouth daily. 02/11/17  Yes Barnett Abu, [place] D, PA-C  loratadine (CLARITIN) 10 MG tablet Take 10 mg by mouth daily.   Yes [provider]  Vitamin D, Ergocalciferol, (DRISDOL) 50000 units CAPS capsule Take 1 capsule (50,000 Units total) by mouth every 7 (seven) days. 11/15/16  Yes Bing Neighbors, FNP  Biotin 1 MG CAPS Take by mouth.    [provider]    Allergies  Allergen Reactions  . Almotriptan Malate     Chest pain  . Eggs Or Egg-Derived Products     Stomach pain  . Imitrex [Sumatriptan Base]   . Penicillins   . Relpax [Eletriptan Hydrobromide] Hypertension  . Sulfa Drugs Cross Reactors   . Vantin     dizziness  . Azithromycin Anxiety    Patient Active Problem List   Diagnosis Date Noted  . Screening for colon cancer 10/31/2014  . Generalized anxiety disorder 09/09/2014  . Hypothyroid 11/24/2011  . Migraine 11/24/2011  . Environmental allergies 11/24/2011  . Fibromyalgia 11/24/2011  . Irritable bowel syndrome (IBS)  11/24/2011  . Osteoporosis 11/24/2011    Past Medical History:  Diagnosis Date  . Allergy   . Arthritis   . Asthma   . Fibromyalgia   . Fibromyalgia muscle pain   . Neuromuscular disorder (HCC)   . Osteoporosis   . Thyroid disease     No past surgical history on file.  Social History   Social History  . Marital status: Unknown    Spouse name: N/A  . Number of children: N/A  . Years of education: N/A   Occupational History  . Not on file.   Social History Main Topics  . Smoking status: Never Smoker  . Smokeless tobacco: Never Used  . Alcohol use Not on file  . Drug use: Unknown  . Sexual activity: Not on file   Other Topics Concern  . Not on file   Social History Narrative  . No narrative on file    Family History  Problem Relation Age of Onset  . Heart disease Mother   . Hypertension Mother   . Hyperlipidemia Mother   . Hearing loss Mother   . Kidney disease Mother   . Vision loss Mother   . Lung disease Mother   . Stroke Father   . Arthritis Sister   . Meniere's disease Brother   . Cancer Maternal Grandmother   . Stroke Maternal Grandmother   . Heart disease Maternal  Grandfather   . Heart disease Maternal Uncle      Review of Systems  Constitutional: Positive for malaise/fatigue. Negative for chills, fever and weight loss.  HENT: Negative.  Negative for hearing loss, nosebleeds and sore throat.   Eyes: Negative.  Negative for blurred vision and double vision.  Respiratory: Negative.  Negative for cough, shortness of breath and wheezing.   Cardiovascular: Negative.  Negative for chest pain, palpitations and leg swelling.  Gastrointestinal: Negative.  Negative for abdominal pain, diarrhea, nausea and vomiting.  Genitourinary: Negative.  Negative for dysuria, flank pain and hematuria.  Musculoskeletal: Positive for joint pain and myalgias.  Skin: Negative for rash.  Neurological: Negative.  Negative for dizziness, sensory change, focal weakness,  loss of consciousness and headaches.  Endo/Heme/Allergies: Negative.   All other systems reviewed and are negative.  Vitals:   08/05/17 1235  BP: 126/78  Pulse: 65  Resp: 16  Temp: 98.2 F (36.8 C)  SpO2: 97%     Physical Exam  Constitutional: She is oriented to person, place, and time. She appears well-developed and well-nourished.  HENT:  Head: Normocephalic and atraumatic.  Right Ear: External ear normal.  Left Ear: External ear normal.  Mouth/Throat: Oropharynx is clear and moist.  Eyes: Pupils are equal, round, and reactive to light. Conjunctivae and EOM are normal.  Neck: Normal range of motion. Neck supple. No JVD present. No thyromegaly present.  Cardiovascular: Normal rate, regular rhythm, normal heart sounds and intact distal pulses.   Pulmonary/Chest: Effort normal and breath sounds normal.  Abdominal: Soft. Bowel sounds are normal. She exhibits no distension. There is no tenderness.  Musculoskeletal: Normal range of motion.  Lymphadenopathy:    She has no cervical adenopathy.  Neurological: She is alert and oriented to person, place, and time. No sensory deficit. She exhibits normal muscle tone.  Skin: Skin is warm and dry. Capillary refill takes less than 2 seconds. No rash noted.  Psychiatric: She has a normal mood and affect. Her behavior is normal.  Vitals reviewed.    ASSESSMENT & PLAN: Jacqueline Wyatt was seen today for medication refill.  Diagnoses and all orders for this visit:  Fibromyalgia -     Ambulatory referral to Rheumatology -     CBC with Differential/Platelet -     Comprehensive metabolic panel -     Lipid panel  Chronic pain syndrome -     Lipid panel  Hypothyroidism, unspecified type -     TSH  Vitamin D deficiency -     VITAMIN D 25 Hydroxy (Vit-D Deficiency, Fractures)  Other orders -     traMADol (ULTRAM) 50 MG tablet; Take 1 tablet (50 mg total) by mouth 2 (two) times daily. As needed.  A total of 25 minutes was spent in the room  with the patient, greater than 50% of which was in counseling/coordination of care regarding her chronic pain condition, therapeutic options, and Rheumatology evaluation.   Patient Instructions       IF you received an x-ray today, you will receive an invoice from [facility]. Please contact [facility] at [phone] with questions or concerns regarding your invoice.   IF you received labwork today, you will receive an invoice from [facility]. Please contact [facility] at [phone] with questions or concerns regarding your invoice.   Our billing staff will not be able to assist you with questions regarding bills from these companies.  You will be contacted with the lab results as soon as they are available. The  fastest way to get your results is to activate your My Chart account. Instructions are located on the last page of this paperwork. If you have not heard from [place] regarding the results in 2 weeks, please contact this office.     Myofascial Pain Syndrome and Fibromyalgia Myofascial pain syndrome and fibromyalgia are both pain disorders. This pain may be felt mainly in your muscles.  Myofascial pain syndrome: ? Always has trigger points or tender points in the muscle that will cause pain when pressed. The pain may come and go. ? Usually affects your neck, upper back, and shoulder areas. The pain often radiates into your arms and hands.  Fibromyalgia: ? Has muscle pains and tenderness that come and go. ? Is often associated with fatigue and sleep disturbances. ? Has trigger points. ? Tends to be long-lasting (chronic), but is not life-threatening.  Fibromyalgia and myofascial pain are not the same. However, they often occur together. If you have both conditions, each can make the other worse. Both are common and can cause enough pain and fatigue to make day-to-day activities difficult. What are the causes? The exact causes of fibromyalgia and myofascial pain are  not known. People with certain gene types may be more likely to develop fibromyalgia. Some factors can be triggers for both conditions, such as:  Spine disorders.  Arthritis.  Severe injury (trauma) and other physical stressors.  Being under a lot of stress.  A medical illness.  What are the signs or symptoms? Fibromyalgia The main symptom of fibromyalgia is widespread pain and tenderness in your muscles. This can vary over time. Pain is sometimes described as stabbing, shooting, or burning. You may have tingling or numbness, too. You may also have sleep problems and fatigue. You may wake up feeling tired and groggy (fibro fog). Other symptoms may include:  Bowel and bladder problems.  Headaches.  Visual problems.  Problems with odors and noises.  Depression or mood changes.  Painful menstrual periods (dysmenorrhea).  Dry skin or eyes.  Myofascial pain syndrome Symptoms of myofascial pain syndrome include:  Tight, ropy bands of muscle.  Uncomfortable sensations in muscular areas, such as: ? Aching. ? Cramping. ? Burning. ? Numbness. ? Tingling. ? Muscle weakness.  Trouble moving certain muscles freely (range of motion).  How is this diagnosed? There are no specific tests to diagnose fibromyalgia or myofascial pain syndrome. Both can be hard to diagnose because their symptoms are common in many other conditions. Your health care provider may suspect one or both of these conditions based on your symptoms and medical history. Your health care provider will also do a physical exam. The key to diagnosing fibromyalgia is having pain, fatigue, and other symptoms for more than three months that cannot be explained by another condition. The key to diagnosing myofascial pain syndrome is finding trigger points in muscles that are tender and cause pain elsewhere in your body (referred pain). How is this treated? Treating fibromyalgia and myofascial pain often requires a team of  health care providers. This usually starts with your primary provider and a physical therapist. You may also find it helpful to work with alternative health care providers, such as massage therapists or acupuncturists. Treatment for fibromyalgia may include medicines. This may include nonsteroidal anti-inflammatory drugs (NSAIDs), along with other medicines. Treatment for myofascial pain may also include:  NSAIDs.  Cooling and stretching of muscles.  Trigger point injections.  Sound wave (ultrasound) treatments to stimulate muscles.  Follow these instructions at home:  Take medicines only as directed by your health care provider.  Exercise as directed by your health care provider or physical therapist.  Try to avoid stressful situations.  Practice relaxation techniques to control your stress. You may want to try: ? Biofeedback. ? Visual imagery. ? Hypnosis. ? Muscle relaxation. ? Yoga. ? Meditation.  Talk to your health care provider about alternative treatments, such as acupuncture or massage treatment.  Maintain a healthy lifestyle. This includes eating a healthy diet and getting enough sleep.  Consider joining a support group.  Do not do activities that stress or strain your muscles. That includes repetitive motions and heavy lifting. Where to find more information:  National Fibromyalgia Association: www.fmaware.org  Arthritis Foundation: www.arthritis.org  American Chronic Pain Association: GumSearch.nl Contact a health care provider if:  You have new symptoms.  Your symptoms get worse.  You have side effects from your medicines.  You have trouble sleeping.  Your condition is causing depression or anxiety. This information is not intended to replace advice given to you by your health care provider. Make sure you discuss any questions you have with your health care provider. Document Released: 10/11/2005 Document Revised:  03/18/2016 Document Reviewed: 07/17/2014 Elsevier Interactive Patient Education  2018 Elsevier Inc.      Edwina Barth, MD [facility]

## 2017-08-05 NOTE — Patient Instructions (Addendum)
   IF you received an x-ray today, you will receive an invoice from [facility]. Please contact [facility] at [phone] with questions or concerns regarding your invoice.   IF you received labwork today, you will receive an invoice from [facility]. Please contact [facility] at [phone] with questions or concerns regarding your invoice.   Our billing staff will not be able to assist you with questions regarding bills from these companies.  You will be contacted with the lab results as soon as they are available. The fastest way to get your results is to activate your My Chart account. Instructions are located on the last page of this paperwork. If you have not heard from us regarding the results in 2 weeks, please contact this office.     Myofascial Pain Syndrome and Fibromyalgia Myofascial pain syndrome and fibromyalgia are both pain disorders. This pain may be felt mainly in your muscles.  Myofascial pain syndrome: ? Always has trigger points or tender points in the muscle that will cause pain when pressed. The pain may come and go. ? Usually affects your neck, upper back, and shoulder areas. The pain often radiates into your arms and hands.  Fibromyalgia: ? Has muscle pains and tenderness that come and go. ? Is often associated with fatigue and sleep disturbances. ? Has trigger points. ? Tends to be long-lasting (chronic), but is not life-threatening.  Fibromyalgia and myofascial pain are not the same. However, they often occur together. If you have both conditions, each can make the other worse. Both are common and can cause enough pain and fatigue to make day-to-day activities difficult. What are the causes? The exact causes of fibromyalgia and myofascial pain are not known. People with certain gene types may be more likely to develop fibromyalgia. Some factors can be triggers for both conditions, such as:  Spine disorders.  Arthritis.  Severe injury  (trauma) and other physical stressors.  Being under a lot of stress.  A medical illness.  What are the signs or symptoms? Fibromyalgia The main symptom of fibromyalgia is widespread pain and tenderness in your muscles. This can vary over time. Pain is sometimes described as stabbing, shooting, or burning. You may have tingling or numbness, too. You may also have sleep problems and fatigue. You may wake up feeling tired and groggy (fibro fog). Other symptoms may include:  Bowel and bladder problems.  Headaches.  Visual problems.  Problems with odors and noises.  Depression or mood changes.  Painful menstrual periods (dysmenorrhea).  Dry skin or eyes.  Myofascial pain syndrome Symptoms of myofascial pain syndrome include:  Tight, ropy bands of muscle.  Uncomfortable sensations in muscular areas, such as: ? Aching. ? Cramping. ? Burning. ? Numbness. ? Tingling. ? Muscle weakness.  Trouble moving certain muscles freely (range of motion).  How is this diagnosed? There are no specific tests to diagnose fibromyalgia or myofascial pain syndrome. Both can be hard to diagnose because their symptoms are common in many other conditions. Your health care provider may suspect one or both of these conditions based on your symptoms and medical history. Your health care provider will also do a physical exam. The key to diagnosing fibromyalgia is having pain, fatigue, and other symptoms for more than three months that cannot be explained by another condition. The key to diagnosing myofascial pain syndrome is finding trigger points in muscles that are tender and cause pain elsewhere in your body (referred pain). How is this treated? Treating fibromyalgia and myofascial   pain often requires a team of health care providers. This usually starts with your primary provider and a physical therapist. You may also find it helpful to work with alternative health care providers, such as massage  therapists or acupuncturists. Treatment for fibromyalgia may include medicines. This may include nonsteroidal anti-inflammatory drugs (NSAIDs), along with other medicines. Treatment for myofascial pain may also include:  NSAIDs.  Cooling and stretching of muscles.  Trigger point injections.  Sound wave (ultrasound) treatments to stimulate muscles.  Follow these instructions at home:  Take medicines only as directed by your health care provider.  Exercise as directed by your health care provider or physical therapist.  Try to avoid stressful situations.  Practice relaxation techniques to control your stress. You may want to try: ? Biofeedback. ? Visual imagery. ? Hypnosis. ? Muscle relaxation. ? Yoga. ? Meditation.  Talk to your health care provider about alternative treatments, such as acupuncture or massage treatment.  Maintain a healthy lifestyle. This includes eating a healthy diet and getting enough sleep.  Consider joining a support group.  Do not do activities that stress or strain your muscles. That includes repetitive motions and heavy lifting. Where to find more information:  National Fibromyalgia Association: www.fmaware.org  Arthritis Foundation: www.arthritis.org  American Chronic Pain Association: www.theacpa.org/condition/myofascial-pain Contact a health care provider if:  You have new symptoms.  Your symptoms get worse.  You have side effects from your medicines.  You have trouble sleeping.  Your condition is causing depression or anxiety. This information is not intended to replace advice given to you by your health care provider. Make sure you discuss any questions you have with your health care provider. Document Released: 10/11/2005 Document Revised: 03/18/2016 Document Reviewed: 07/17/2014 Elsevier Interactive Patient Education  2018 Elsevier Inc.  

## 2017-08-08 LAB — COMPREHENSIVE METABOLIC PANEL
ALK PHOS: 77 IU/L (ref 39–117)
ALT: 16 IU/L (ref 0–32)
AST: 20 IU/L (ref 0–40)
Albumin/Globulin Ratio: 1.6 (ref 1.2–2.2)
Albumin: 4.3 g/dL (ref 3.5–4.8)
BUN/Creatinine Ratio: 17 (ref 12–28)
BUN: 17 mg/dL (ref 8–27)
Bilirubin Total: 0.4 mg/dL (ref 0.0–1.2)
CALCIUM: 9.3 mg/dL (ref 8.7–10.3)
CO2: 22 mmol/L (ref 20–29)
CREATININE: 1.02 mg/dL — AB (ref 0.57–1.00)
Chloride: 104 mmol/L (ref 96–106)
GFR calc Af Amer: 64 mL/min/{1.73_m2} (ref >59)
GFR, EST NON AFRICAN AMERICAN: 55 mL/min/{1.73_m2} — AB (ref >59)
GLUCOSE: 93 mg/dL (ref 65–99)
Globulin, Total: 2.7 g/dL (ref 1.5–4.5)
Potassium: 4.6 mmol/L (ref 3.5–5.2)
Sodium: 142 mmol/L (ref 134–144)
Total Protein: 7 g/dL (ref 6.0–8.5)

## 2017-08-08 LAB — LIPID PANEL
CHOL/HDL RATIO: 3 ratio (ref 0.0–4.4)
CHOLESTEROL TOTAL: 193 mg/dL (ref 100–199)
HDL: 65 mg/dL (ref >39)
LDL CALC: 99 mg/dL (ref 0–99)
TRIGLYCERIDES: 145 mg/dL (ref 0–149)
VLDL Cholesterol Cal: 29 mg/dL (ref 5–40)

## 2017-08-08 LAB — CBC WITH DIFFERENTIAL/PLATELET
BASOS ABS: 0 10*3/uL (ref 0.0–0.2)
Basos: 1 %
EOS (ABSOLUTE): 0.1 10*3/uL (ref 0.0–0.4)
EOS: 3 %
HEMATOCRIT: 43.2 % (ref 34.0–46.6)
Hemoglobin: 13.9 g/dL (ref 11.1–15.9)
Immature Grans (Abs): 0 10*3/uL (ref 0.0–0.1)
Immature Granulocytes: 0 %
Lymphocytes Absolute: 1.6 10*3/uL (ref 0.7–3.1)
Lymphs: 40 %
MCH: 31.3 pg (ref 26.6–33.0)
MCHC: 32.2 g/dL (ref 31.5–35.7)
MCV: 97 fL (ref 79–97)
MONOS ABS: 0.4 10*3/uL (ref 0.1–0.9)
Monocytes: 11 %
NEUTROS PCT: 45 %
Neutrophils Absolute: 1.8 10*3/uL (ref 1.4–7.0)
PLATELETS: 244 10*3/uL (ref 150–379)
RBC: 4.44 x10E6/uL (ref 3.77–5.28)
RDW: 14 % (ref 12.3–15.4)
WBC: 4 10*3/uL (ref 3.4–10.8)

## 2017-08-08 LAB — TSH: TSH: 3.01 u[IU]/mL (ref 0.450–4.500)

## 2017-08-08 LAB — VITAMIN D 25 HYDROXY (VIT D DEFICIENCY, FRACTURES): Vit D, 25-Hydroxy: 23.1 ng/mL — ABNORMAL LOW (ref 30.0–100.0)

## 2017-08-29 ENCOUNTER — Encounter: Payer: Self-pay | Admitting: Emergency Medicine

## 2018-03-08 ENCOUNTER — Other Ambulatory Visit: Payer: Self-pay

## 2018-03-08 ENCOUNTER — Telehealth: Payer: Self-pay | Admitting: Emergency Medicine

## 2018-03-08 DIAGNOSIS — E039 Hypothyroidism, unspecified: Secondary | ICD-10-CM

## 2018-03-08 MED ORDER — LEVOTHYROXINE SODIUM 50 MCG PO TABS: 50.0000 ug | ORAL_TABLET | Freq: Every day | ORAL | 0 refills | Status: DC

## 2018-03-08 NOTE — Telephone Encounter (Signed)
30 day supply. Pt schedule OV.

## 2018-03-08 NOTE — Telephone Encounter (Signed)
Request for levothyroxine 50 mcg. Prescription expired on 02/11/18 Dr. Alvy Bimler  LOV  08/05/17 NOV  03/14/18  Last TSH  3.010 on 08/05/17  [id number]

## 2018-03-08 NOTE — Telephone Encounter (Signed)
Copied from CRM [id number]. Topic: Quick Communication - Rx Refill/Question >> Mar 08, 2018  3:07 PM Floria Raveling A wrote: Medication: levothyroxine (SYNTHROID, LEVOTHROID) 50 MCG tablet [[id number]]  Has the patient contacted their pharmacy? No  (Agent: If no, request that the patient contact the pharmacy for the refill.) Preferred Pharmacy (with phone number or street name): [facility] on [address]  Agent: Please be advised that RX refills may take up to 3 business days. We ask that you follow-up with your pharmacy.

## 2018-03-15 ENCOUNTER — Encounter: Payer: Self-pay | Admitting: Emergency Medicine

## 2018-03-15 ENCOUNTER — Ambulatory Visit (INDEPENDENT_AMBULATORY_CARE_PROVIDER_SITE_OTHER): Payer: Medicare Other | Admitting: Emergency Medicine

## 2018-03-15 VITALS — BP 145/85 | HR 72 | Temp 98.3°F | Resp 17 | Ht 66.0 in | Wt 196.0 lb

## 2018-03-15 DIAGNOSIS — E039 Hypothyroidism, unspecified: Secondary | ICD-10-CM

## 2018-03-15 DIAGNOSIS — M797 Fibromyalgia: Secondary | ICD-10-CM | POA: Diagnosis not present

## 2018-03-15 DIAGNOSIS — G894 Chronic pain syndrome: Secondary | ICD-10-CM

## 2018-03-15 MED ORDER — LEVOTHYROXINE SODIUM 50 MCG PO TABS: 50.0000 ug | ORAL_TABLET | Freq: Every day | ORAL | 3 refills | Status: DC

## 2018-03-15 MED ORDER — TRAMADOL HCL 50 MG PO TABS: 50.0000 mg | ORAL_TABLET | Freq: Two times a day (BID) | ORAL | 3 refills | Status: DC

## 2018-03-15 NOTE — Progress Notes (Signed)
Jacqueline Wyatt 72 y.o.   Chief Complaint  Patient presents with  . Medication Refill    tramadol, levothyroxine    HISTORY OF PRESENT ILLNESS: This is a 72 y.o. female with history of fibromyalgia and thyroid condition needs medication refill.  Has no new complaints or medical concerns. Refuses colonoscopy. HPI   Prior to Admission medications   Medication Sig Start Date End Date Taking? Authorizing Provider  aspirin 81 MG tablet Take 81 mg by mouth daily.   Yes [provider]  calcium-vitamin D (OSCAL WITH D) 500-200 MG-UNIT per tablet Take 1 tablet by mouth.   Yes [provider]  levothyroxine (SYNTHROID, LEVOTHROID) 50 MCG tablet Take 1 tablet (50 mcg total) by mouth daily. 03/15/18 06/13/18 Yes Jacqueline Wyatt, Jacqueline Kempf, MD  loratadine (CLARITIN) 10 MG tablet Take 10 mg by mouth daily.   Yes [provider]  traMADol (ULTRAM) 50 MG tablet Take 1 tablet (50 mg total) by mouth 2 (two) times daily. As needed. 03/15/18  Yes Jacqueline Wyatt, Jacqueline Kempf, MD  Biotin 1 MG CAPS Take by mouth.    [provider]  co-enzyme Q-10 30 MG capsule Take 30 mg by mouth 3 (three) times daily.    [provider]  Vitamin D, Ergocalciferol, (DRISDOL) 50000 units CAPS capsule Take 1 capsule (50,000 Units total) by mouth Wyatt 7 (seven) days. Patient not taking: Reported on 03/15/2018 11/15/16   Jacqueline Neighbors, FNP    Allergies  Allergen Reactions  . Almotriptan Malate     Chest pain  . Eggs Or Egg-Derived Products     Stomach pain  . Imitrex [Sumatriptan Base]   . Penicillins   . Relpax [Eletriptan Hydrobromide] Hypertension  . Sulfa Drugs Cross Reactors   . Vantin     dizziness  . Azithromycin Anxiety    Patient Active Problem List   Diagnosis Date Noted  . Chronic pain syndrome 08/05/2017  . Vitamin D deficiency 08/05/2017  . Screening for colon cancer 10/31/2014  . Generalized anxiety disorder 09/09/2014  . Hypothyroid 11/24/2011  . Migraine  11/24/2011  . Environmental allergies 11/24/2011  . Fibromyalgia 11/24/2011  . Irritable bowel syndrome (IBS) 11/24/2011  . Osteoporosis 11/24/2011    Past Medical History:  Diagnosis Date  . Allergy   . Arthritis   . Asthma   . Fibromyalgia   . Fibromyalgia muscle pain   . Neuromuscular disorder (HCC)   . Osteoporosis   . Thyroid disease     No past surgical history on file.  Social History   Socioeconomic History  . Marital status: Unknown    Spouse name: Not on file  . Number of children: Not on file  . Years of education: Not on file  . Highest education level: Not on file  Occupational History  . Not on file  Social Needs  . Financial resource strain: Not on file  . Food insecurity:    Worry: Not on file    Inability: Not on file  . Transportation needs:    Medical: Not on file    Non-medical: Not on file  Tobacco Use  . Smoking status: Never Smoker  . Smokeless tobacco: Never Used  Substance and Sexual Activity  . Alcohol use: Not on file  . Drug use: Not on file  . Sexual activity: Not on file  Lifestyle  . Physical activity:    Days per week: Not on file    Minutes per session: Not on file  . Stress: Not  on file  Relationships  . Social connections:    Talks on phone: Not on file    Gets together: Not on file    Attends religious service: Not on file    Active member of club or organization: Not on file    Attends meetings of clubs or organizations: Not on file    Relationship status: Not on file  . Intimate partner violence:    Fear of current or ex partner: Not on file    Emotionally abused: Not on file    Physically abused: Not on file    Forced sexual activity: Not on file  Other Topics Concern  . Not on file  Social History Narrative  . Not on file    Family History  Problem Relation Age of Onset  . Heart disease Mother   . Hypertension Mother   . Hyperlipidemia Mother   . Hearing loss Mother   . Kidney disease Mother   .  Vision loss Mother   . Lung disease Mother   . Stroke Father   . Arthritis Sister   . Meniere's disease Brother   . Cancer Maternal Grandmother   . Stroke Maternal Grandmother   . Heart disease Maternal Grandfather   . Heart disease Maternal Uncle      Review of Systems  Constitutional: Positive for malaise/fatigue. Negative for chills, fever and weight loss.  HENT: Negative.  Negative for congestion, hearing loss, nosebleeds, sinus pain and sore throat.   Eyes: Negative.  Negative for blurred vision, double vision and pain.  Respiratory: Negative.  Negative for cough and shortness of breath.   Cardiovascular: Negative.  Negative for chest pain and palpitations.  Gastrointestinal: Negative.  Negative for abdominal pain, blood in stool, diarrhea, melena, nausea and vomiting.  Musculoskeletal: Positive for joint pain and myalgias.  Skin: Negative for rash.  Neurological: Positive for headaches. Negative for sensory change, focal weakness and weakness.  All other systems reviewed and are negative.   Vitals:   03/15/18 1529  BP: (!) 145/85  Pulse: 72  Resp: 17  Temp: 98.3 F (36.8 C)  SpO2: 98%    Physical Exam  Constitutional: She is oriented to person, place, and time. She appears well-developed and well-nourished.  HENT:  Head: Normocephalic and atraumatic.  Mouth/Throat: Oropharynx is clear and moist.  Eyes: Pupils are equal, round, and reactive to light. Conjunctivae and EOM are normal.  Neck: Normal range of motion. Neck supple. No JVD present.  Cardiovascular: Normal rate, regular rhythm and normal heart sounds.  Pulmonary/Chest: Effort normal and breath sounds normal.  Abdominal: Soft. Bowel sounds are normal. She exhibits no distension. There is no tenderness.  Musculoskeletal: Normal range of motion.  Lymphadenopathy:    She has no cervical adenopathy.  Neurological: She is alert and oriented to person, place, and time. No cranial nerve deficit or sensory  deficit. She exhibits normal muscle tone.  Skin: Skin is warm and dry. Capillary refill takes less than 2 seconds. No rash noted.  Psychiatric: She has a normal mood and affect. Her behavior is normal.  Vitals reviewed.    ASSESSMENT & PLAN: Jacqueline Wyatt was seen today for medication refill.  Diagnoses and all orders for this visit:  Fibromyalgia  Hypothyroidism, unspecified type -     levothyroxine (SYNTHROID, LEVOTHROID) 50 MCG tablet; Take 1 tablet (50 mcg total) by mouth daily.  Chronic pain syndrome  Other orders -     traMADol (ULTRAM) 50 MG tablet; Take 1 tablet (50  mg total) by mouth 2 (two) times daily. As needed.   A total of 25 minutes was spent in the room with the patient, greater than 50% of which was in counseling/coordination of care regarding fibromyalgia treatment, medications, nutrition, physical activity, and need for follow-up.  Patient Instructions       IF you received an x-ray today, you will receive an invoice from [facility]. Please contact [facility] at [phone] with questions or concerns regarding your invoice.   IF you received labwork today, you will receive an invoice from [facility]. Please contact [facility] at [phone] with questions or concerns regarding your invoice.   Our billing staff will not be able to assist you with questions regarding bills from these companies.  You will be contacted with the lab results as soon as they are available. The fastest way to get your results is to activate your My Chart account. Instructions are located on the last page of this paperwork. If you have not heard from [place] regarding the results in 2 weeks, please contact this office.      Myofascial Pain Syndrome and Fibromyalgia Myofascial pain syndrome and fibromyalgia are both pain disorders. This pain may be felt mainly in your muscles.  Myofascial pain syndrome: ? Always has trigger points or tender points in the muscle that will  cause pain when pressed. The pain may come and go. ? Usually affects your neck, upper back, and shoulder areas. The pain often radiates into your arms and hands.  Fibromyalgia: ? Has muscle pains and tenderness that come and go. ? Is often associated with fatigue and sleep disturbances. ? Has trigger points. ? Tends to be long-lasting (chronic), but is not life-threatening.  Fibromyalgia and myofascial pain are not the same. However, they often occur together. If you have both conditions, each can make the other worse. Both are common and can cause enough pain and fatigue to make day-to-day activities difficult. What are the causes? The exact causes of fibromyalgia and myofascial pain are not known. People with certain gene types may be more likely to develop fibromyalgia. Some factors can be triggers for both conditions, such as:  Spine disorders.  Arthritis.  Severe injury (trauma) and other physical stressors.  Being under a lot of stress.  A medical illness.  What are the signs or symptoms? Fibromyalgia The main symptom of fibromyalgia is widespread pain and tenderness in your muscles. This can vary over time. Pain is sometimes described as stabbing, shooting, or burning. You may have tingling or numbness, too. You may also have sleep problems and fatigue. You may wake up feeling tired and groggy (fibro fog). Other symptoms may include:  Bowel and bladder problems.  Headaches.  Visual problems.  Problems with odors and noises.  Depression or mood changes.  Painful menstrual periods (dysmenorrhea).  Dry skin or eyes.  Myofascial pain syndrome Symptoms of myofascial pain syndrome include:  Tight, ropy bands of muscle.  Uncomfortable sensations in muscular areas, such as: ? Aching. ? Cramping. ? Burning. ? Numbness. ? Tingling. ? Muscle weakness.  Trouble moving certain muscles freely (range of motion).  How is this diagnosed? There are no specific tests to  diagnose fibromyalgia or myofascial pain syndrome. Both can be hard to diagnose because their symptoms are common in many other conditions. Your health care provider may suspect one or both of these conditions based on your symptoms and medical history. Your health care provider will also do a physical exam. The key to  diagnosing fibromyalgia is having pain, fatigue, and other symptoms for more than three months that cannot be explained by another condition. The key to diagnosing myofascial pain syndrome is finding trigger points in muscles that are tender and cause pain elsewhere in your body (referred pain). How is this treated? Treating fibromyalgia and myofascial pain often requires a team of health care providers. This usually starts with your primary provider and a physical therapist. You may also find it helpful to work with alternative health care providers, such as massage therapists or acupuncturists. Treatment for fibromyalgia may include medicines. This may include nonsteroidal anti-inflammatory drugs (NSAIDs), along with other medicines. Treatment for myofascial pain may also include:  NSAIDs.  Cooling and stretching of muscles.  Trigger point injections.  Sound wave (ultrasound) treatments to stimulate muscles.  Follow these instructions at home:  Take medicines only as directed by your health care provider.  Exercise as directed by your health care provider or physical therapist.  Try to avoid stressful situations.  Practice relaxation techniques to control your stress. You may want to try: ? Biofeedback. ? Visual imagery. ? Hypnosis. ? Muscle relaxation. ? Yoga. ? Meditation.  Talk to your health care provider about alternative treatments, such as acupuncture or massage treatment.  Maintain a healthy lifestyle. This includes eating a healthy diet and getting enough sleep.  Consider joining a support group.  Do not do activities that stress or strain your muscles.  That includes repetitive motions and heavy lifting. Where to find more information:  National Fibromyalgia Association: www.fmaware.org  Arthritis Foundation: www.arthritis.org  American Chronic Pain Association: GumSearch.nl Contact a health care provider if:  You have new symptoms.  Your symptoms get worse.  You have side effects from your medicines.  You have trouble sleeping.  Your condition is causing depression or anxiety. This information is not intended to replace advice given to you by your health care provider. Make sure you discuss any questions you have with your health care provider. Document Released: 10/11/2005 Document Revised: 03/18/2016 Document Reviewed: 07/17/2014 Elsevier Interactive Patient Education  2018 Elsevier Inc.      Edwina Barth, MD [facility]

## 2018-03-15 NOTE — Patient Instructions (Addendum)
   IF you received an x-ray today, you will receive an invoice from [facility]. Please contact [facility] at [phone] with questions or concerns regarding your invoice.   IF you received labwork today, you will receive an invoice from [facility]. Please contact [facility] at [phone] with questions or concerns regarding your invoice.   Our billing staff will not be able to assist you with questions regarding bills from these companies.  You will be contacted with the lab results as soon as they are available. The fastest way to get your results is to activate your My Chart account. Instructions are located on the last page of this paperwork. If you have not heard from us regarding the results in 2 weeks, please contact this office.     Myofascial Pain Syndrome and Fibromyalgia Myofascial pain syndrome and fibromyalgia are both pain disorders. This pain may be felt mainly in your muscles.  Myofascial pain syndrome: ? Always has trigger points or tender points in the muscle that will cause pain when pressed. The pain may come and go. ? Usually affects your neck, upper back, and shoulder areas. The pain often radiates into your arms and hands.  Fibromyalgia: ? Has muscle pains and tenderness that come and go. ? Is often associated with fatigue and sleep disturbances. ? Has trigger points. ? Tends to be long-lasting (chronic), but is not life-threatening.  Fibromyalgia and myofascial pain are not the same. However, they often occur together. If you have both conditions, each can make the other worse. Both are common and can cause enough pain and fatigue to make day-to-day activities difficult. What are the causes? The exact causes of fibromyalgia and myofascial pain are not known. People with certain gene types may be more likely to develop fibromyalgia. Some factors can be triggers for both conditions, such as:  Spine disorders.  Arthritis.  Severe injury  (trauma) and other physical stressors.  Being under a lot of stress.  A medical illness.  What are the signs or symptoms? Fibromyalgia The main symptom of fibromyalgia is widespread pain and tenderness in your muscles. This can vary over time. Pain is sometimes described as stabbing, shooting, or burning. You may have tingling or numbness, too. You may also have sleep problems and fatigue. You may wake up feeling tired and groggy (fibro fog). Other symptoms may include:  Bowel and bladder problems.  Headaches.  Visual problems.  Problems with odors and noises.  Depression or mood changes.  Painful menstrual periods (dysmenorrhea).  Dry skin or eyes.  Myofascial pain syndrome Symptoms of myofascial pain syndrome include:  Tight, ropy bands of muscle.  Uncomfortable sensations in muscular areas, such as: ? Aching. ? Cramping. ? Burning. ? Numbness. ? Tingling. ? Muscle weakness.  Trouble moving certain muscles freely (range of motion).  How is this diagnosed? There are no specific tests to diagnose fibromyalgia or myofascial pain syndrome. Both can be hard to diagnose because their symptoms are common in many other conditions. Your health care provider may suspect one or both of these conditions based on your symptoms and medical history. Your health care provider will also do a physical exam. The key to diagnosing fibromyalgia is having pain, fatigue, and other symptoms for more than three months that cannot be explained by another condition. The key to diagnosing myofascial pain syndrome is finding trigger points in muscles that are tender and cause pain elsewhere in your body (referred pain). How is this treated? Treating fibromyalgia and myofascial   pain often requires a team of health care providers. This usually starts with your primary provider and a physical therapist. You may also find it helpful to work with alternative health care providers, such as massage  therapists or acupuncturists. Treatment for fibromyalgia may include medicines. This may include nonsteroidal anti-inflammatory drugs (NSAIDs), along with other medicines. Treatment for myofascial pain may also include:  NSAIDs.  Cooling and stretching of muscles.  Trigger point injections.  Sound wave (ultrasound) treatments to stimulate muscles.  Follow these instructions at home:  Take medicines only as directed by your health care provider.  Exercise as directed by your health care provider or physical therapist.  Try to avoid stressful situations.  Practice relaxation techniques to control your stress. You may want to try: ? Biofeedback. ? Visual imagery. ? Hypnosis. ? Muscle relaxation. ? Yoga. ? Meditation.  Talk to your health care provider about alternative treatments, such as acupuncture or massage treatment.  Maintain a healthy lifestyle. This includes eating a healthy diet and getting enough sleep.  Consider joining a support group.  Do not do activities that stress or strain your muscles. That includes repetitive motions and heavy lifting. Where to find more information:  National Fibromyalgia Association: www.fmaware.org  Arthritis Foundation: www.arthritis.org  American Chronic Pain Association: www.theacpa.org/condition/myofascial-pain Contact a health care provider if:  You have new symptoms.  Your symptoms get worse.  You have side effects from your medicines.  You have trouble sleeping.  Your condition is causing depression or anxiety. This information is not intended to replace advice given to you by your health care provider. Make sure you discuss any questions you have with your health care provider. Document Released: 10/11/2005 Document Revised: 03/18/2016 Document Reviewed: 07/17/2014 Elsevier Interactive Patient Education  2018 Elsevier Inc.  

## 2018-09-15 ENCOUNTER — Ambulatory Visit: Payer: Medicare Other | Admitting: Emergency Medicine

## 2018-11-23 ENCOUNTER — Other Ambulatory Visit: Payer: Self-pay | Admitting: Emergency Medicine

## 2018-11-23 NOTE — Telephone Encounter (Signed)
Copied from CRM [id number]. Topic: Quick Communication - Rx Refill/Question >> Nov 23, 2018  4:26 PM Fanny Bien wrote: Medication: traMADol (ULTRAM) 50 MG tablet [[id number]]   Has the patient contacted their pharmacy? no Preferred Pharmacy (with phone number or street name): [id number] [place], [place] - [phone] AT [facility] [address] & [address] [phone] (Phone) [phone] (Fax)    Agent: Please be advised that RX refills may take up to 3 business days. We ask that you follow-up with your pharmacy.

## 2018-11-24 ENCOUNTER — Ambulatory Visit (INDEPENDENT_AMBULATORY_CARE_PROVIDER_SITE_OTHER): Payer: Medicare Other | Admitting: Emergency Medicine

## 2018-11-24 ENCOUNTER — Other Ambulatory Visit: Payer: Self-pay

## 2018-11-24 ENCOUNTER — Encounter: Payer: Self-pay | Admitting: Emergency Medicine

## 2018-11-24 VITALS — BP 122/74 | HR 79 | Temp 98.5°F | Resp 16 | Ht 65.0 in | Wt 194.2 lb

## 2018-11-24 DIAGNOSIS — R5383 Other fatigue: Secondary | ICD-10-CM

## 2018-11-24 DIAGNOSIS — G894 Chronic pain syndrome: Secondary | ICD-10-CM | POA: Diagnosis not present

## 2018-11-24 DIAGNOSIS — M797 Fibromyalgia: Secondary | ICD-10-CM

## 2018-11-24 DIAGNOSIS — E039 Hypothyroidism, unspecified: Secondary | ICD-10-CM

## 2018-11-24 MED ORDER — TRAMADOL HCL 50 MG PO TABS: 50.0000 mg | ORAL_TABLET | Freq: Two times a day (BID) | ORAL | 3 refills | Status: DC

## 2018-11-24 NOTE — Progress Notes (Signed)
Jacqueline Wyatt 73 y.o.   Chief Complaint  Patient presents with  . Medication Refill    Tramadol    HISTORY OF PRESENT ILLNESS: This is a 73 y.o. female with history of fibromyalgia here for medication refill.  Has been taking tramadol with success over the past several years.  No new symptoms.  No complaints or any other medical concerns at this time.  HPI   Prior to Admission medications   Medication Sig Start Date End Date Taking? Authorizing Provider  Biotin 1 MG CAPS Take by mouth.   Yes [provider]  calcium-vitamin D (OSCAL WITH D) 500-200 MG-UNIT per tablet Take 1 tablet by mouth.   Yes [provider]  loratadine (CLARITIN) 10 MG tablet Take 10 mg by mouth daily.   Yes [provider]  traMADol (ULTRAM) 50 MG tablet Take 1 tablet (50 mg total) by mouth 2 (two) times daily. As needed. 03/15/18  Yes SagardiaEilleen Kempf, MD  aspirin 81 MG tablet Take 81 mg by mouth daily.    [provider]  co-enzyme Q-10 30 MG capsule Take 30 mg by mouth 3 (three) times daily.    [provider]  levothyroxine (SYNTHROID, LEVOTHROID) 50 MCG tablet Take 1 tablet (50 mcg total) by mouth daily. 03/15/18 06/13/18  Georgina Quint, MD  Vitamin D, Ergocalciferol, (DRISDOL) 50000 units CAPS capsule Take 1 capsule (50,000 Units total) by mouth every 7 (seven) days. Patient not taking: Reported on 11/24/2018 11/15/16   Bing Neighbors, FNP    Allergies  Allergen Reactions  . Almotriptan Malate     Chest pain  . Eggs Or Egg-Derived Products     Stomach pain  . Imitrex [Sumatriptan Base]   . Penicillins   . Relpax [Eletriptan Hydrobromide] Hypertension  . Sulfa Drugs Cross Reactors   . Vantin     dizziness  . Azithromycin Anxiety    Patient Active Problem List   Diagnosis Date Noted  . Chronic pain syndrome 08/05/2017  . Vitamin D deficiency 08/05/2017  . Generalized anxiety disorder 09/09/2014  . Hypothyroid 11/24/2011  . Migraine  11/24/2011  . Environmental allergies 11/24/2011  . Fibromyalgia 11/24/2011  . Irritable bowel syndrome (IBS) 11/24/2011  . Osteoporosis 11/24/2011    Past Medical History:  Diagnosis Date  . Allergy   . Arthritis   . Asthma   . Fibromyalgia   . Fibromyalgia muscle pain   . Neuromuscular disorder (HCC)   . Osteoporosis   . Thyroid disease     No past surgical history on file.  Social History   Socioeconomic History  . Marital status: Unknown    Spouse name: Not on file  . Number of children: Not on file  . Years of education: Not on file  . Highest education level: Not on file  Occupational History  . Not on file  Social Needs  . Financial resource strain: Not on file  . Food insecurity:    Worry: Not on file    Inability: Not on file  . Transportation needs:    Medical: Not on file    Non-medical: Not on file  Tobacco Use  . Smoking status: Never Smoker  . Smokeless tobacco: Never Used  Substance and Sexual Activity  . Alcohol use: Not on file  . Drug use: Not on file  . Sexual activity: Not on file  Lifestyle  . Physical activity:    Days per week: Not on file    Minutes per  session: Not on file  . Stress: Not on file  Relationships  . Social connections:    Talks on phone: Not on file    Gets together: Not on file    Attends religious service: Not on file    Active member of club or organization: Not on file    Attends meetings of clubs or organizations: Not on file    Relationship status: Not on file  . Intimate partner violence:    Fear of current or ex partner: Not on file    Emotionally abused: Not on file    Physically abused: Not on file    Forced sexual activity: Not on file  Other Topics Concern  . Not on file  Social History Narrative  . Not on file    Family History  Problem Relation Age of Onset  . Heart disease Mother   . Hypertension Mother   . Hyperlipidemia Mother   . Hearing loss Mother   . Kidney disease Mother   .  Vision loss Mother   . Lung disease Mother   . Stroke Father   . Arthritis Sister   . Meniere's disease Brother   . Cancer Maternal Grandmother   . Stroke Maternal Grandmother   . Heart disease Maternal Grandfather   . Heart disease Maternal Uncle      Review of Systems  Constitutional: Negative.  Negative for chills and fever.  HENT: Negative.   Eyes: Negative.   Respiratory: Negative.  Negative for cough and shortness of breath.   Cardiovascular: Negative.  Negative for chest pain and palpitations.  Gastrointestinal: Negative.  Negative for abdominal pain, diarrhea, nausea and vomiting.  Genitourinary: Negative.  Negative for dysuria and hematuria.  Musculoskeletal: Positive for back pain and myalgias.  Skin: Negative.  Negative for rash.  Neurological: Negative.  Negative for dizziness and headaches.  Endo/Heme/Allergies: Negative.   All other systems reviewed and are negative.   Vitals:   11/24/18 1542  BP: 122/74  Pulse: 79  Resp: 16  Temp: 98.5 F (36.9 C)  SpO2: 97%    Physical Exam Vitals signs reviewed.  Constitutional:      Appearance: Normal appearance.  HENT:     Head: Normocephalic and atraumatic.     Mouth/Throat:     Mouth: Mucous membranes are moist.     Pharynx: Oropharynx is clear.  Eyes:     Extraocular Movements: Extraocular movements intact.     Conjunctiva/sclera: Conjunctivae normal.     Pupils: Pupils are equal, round, and reactive to light.  Cardiovascular:     Rate and Rhythm: Normal rate and regular rhythm.     Heart sounds: Normal heart sounds.  Pulmonary:     Effort: Pulmonary effort is normal.     Breath sounds: Normal breath sounds.  Skin:    General: Skin is warm and dry.  Neurological:     General: No focal deficit present.     Mental Status: She is alert and oriented to person, place, and time.  Psychiatric:        Mood and Affect: Mood normal.        Behavior: Behavior normal.    A total of 25 minutes was spent in  the room with the patient, greater than 50% of which was in counseling/coordination of care regarding chronic medical problems, management, medications and side effects, and need for follow-up. We also discussed referral to a rheumatologist.   ASSESSMENT & PLAN: [name] was seen today for medication  refill.  Diagnoses and all orders for this visit:  Fibromyalgia -     Discontinue: traMADol (ULTRAM) 50 MG tablet; Take 1 tablet (50 mg total) by mouth 2 (two) times daily. As needed. -     Ambulatory referral to Rheumatology -     traMADol (ULTRAM) 50 MG tablet; Take 1 tablet (50 mg total) by mouth 2 (two) times daily. As needed.  Chronic pain syndrome -     Discontinue: traMADol (ULTRAM) 50 MG tablet; Take 1 tablet (50 mg total) by mouth 2 (two) times daily. As needed. -     traMADol (ULTRAM) 50 MG tablet; Take 1 tablet (50 mg total) by mouth 2 (two) times daily. As needed.  Hypothyroidism, unspecified type  Fatigue, unspecified type    Patient Instructions       If you have lab work done today you will be contacted with your lab results within the next 2 weeks.  If you have not heard from us then please contact us. The fastest way to get your results is to register for My Chart.   IF you received an x-ray today, you will receive an invoice from [facility]. Please contact [facility] at [phone] with questions or concerns regarding your invoice.   IF you received labwork today, you will receive an invoice from [facility]. Please contact [facility] at [phone] with questions or concerns regarding your invoice.   Our billing staff will not be able to assist you with questions regarding bills from these companies.  You will be contacted with the lab results as soon as they are available. The fastest way to get your results is to activate your My Chart account. Instructions are located on the last page of this paperwork. If you have not heard from us regarding the  results in 2 weeks, please contact this office.     Myofascial Pain Syndrome and Fibromyalgia Myofascial pain syndrome and fibromyalgia are both pain disorders. This pain may be felt mainly in your muscles.  Myofascial pain syndrome: ? Always has tender points in the muscle that will cause pain when pressed (trigger points). The pain may come and go. ? Usually affects your neck, upper back, and shoulder areas. The pain often radiates into your arms and hands.  Fibromyalgia: ? Has muscle pains and tenderness that come and go. ? Is often associated with fatigue and sleep problems. ? Has trigger points. ? Tends to be long-lasting (chronic), but is not life-threatening. Fibromyalgia and myofascial pain syndrome are not the same. However, they often occur together. If you have both conditions, each can make the other worse. Both are common and can cause enough pain and fatigue to make day-to-day activities difficult. Both can be hard to diagnose because their symptoms are common in many other conditions. What are the causes? The exact causes of these conditions are not known. What increases the risk? You are more likely to develop this condition if:  You have a family history of the condition.  You have certain triggers, such as: ? Spine disorders. ? An injury (trauma) or other physical stressors. ? Being under a lot of stress. ? Medical conditions such as osteoarthritis, rheumatoid arthritis, or lupus. What are the signs or symptoms? Fibromyalgia The main symptom of fibromyalgia is widespread pain and tenderness in your muscles. Pain is sometimes described as stabbing, shooting, or burning. You may also have:  Tingling or numbness.  Sleep problems and fatigue.  Problems with attention and concentration (fibro fog). Other  symptoms may include:  Bowel and bladder problems.  Headaches.  Visual problems.  Problems with odors and noises.  Depression or mood  changes.  Painful menstrual periods (dysmenorrhea).  Dry skin or eyes. These symptoms can vary over time. Myofascial pain syndrome Symptoms of myofascial pain syndrome include:  Tight, ropy bands of muscle.  Uncomfortable sensations in muscle areas. These may include aching, cramping, burning, numbness, tingling, and weakness.  Difficulty moving certain parts of the body freely (poor range of motion). How is this diagnosed? This condition may be diagnosed by your symptoms and medical history. You will also have a physical exam. In general:  Fibromyalgia is diagnosed if you have pain, fatigue, and other symptoms for more than 3 months, and symptoms cannot be explained by another condition.  Myofascial pain syndrome is diagnosed if you have trigger points in your muscles, and those trigger points are tender and cause pain elsewhere in your body (referred pain). How is this treated? Treatment for these conditions depends on the type that you have.  For fibromyalgia: ? Pain medicines, such as NSAIDs. ? Medicines for treating depression. ? Medicines for treating seizures. ? Medicines that relax the muscles.  For myofascial pain: ? Pain medicines, such as NSAIDs. ? Cooling and stretching of muscles. ? Trigger point injections. ? Sound wave (ultrasound) treatments to stimulate muscles. Treating these conditions often requires a team of health care providers. These may include:  Your primary care provider.  Physical therapist.  Complementary health care providers, such as massage therapists or acupuncturists.  Psychiatrist for cognitive behavioral therapy. Follow these instructions at home: Medicines  Take over-the-counter and prescription medicines only as told by your health care provider.  Do not drive or use heavy machinery while taking prescription pain medicine.  If you are taking prescription pain medicine, take actions to prevent or treat constipation. Your health  care provider may recommend that you: ? Drink enough fluid to keep your urine pale yellow. ? Eat foods that are high in fiber, such as fresh fruits and vegetables, whole grains, and beans. ? Limit foods that are high in fat and processed sugars, such as fried or sweet foods. ? Take an over-the-counter or prescription medicine for constipation. Lifestyle   Exercise as directed by your health care provider or physical therapist.  Practice relaxation techniques to control your stress. You may want to try: ? Biofeedback. ? Visual imagery. ? Hypnosis. ? Muscle relaxation. ? Yoga. ? Meditation.  Maintain a healthy lifestyle. This includes eating a healthy diet and getting enough sleep.  Do not use any products that contain nicotine or tobacco, such as cigarettes and e-cigarettes. If you need help quitting, ask your health care provider. General instructions  Talk to your health care provider about complementary treatments, such as acupuncture or massage.  Consider joining a support group with others who are diagnosed with this condition.  Do not do activities that stress or strain your muscles. This includes repetitive motions and heavy lifting.  Keep all follow-up visits as told by your health care provider. This is important. Where to find more information  National Fibromyalgia Association: www.fmaware.org  Arthritis Foundation: www.arthritis.org  American Chronic Pain Association: www.theacpa.org Contact a health care provider if:  You have new symptoms.  Your symptoms get worse or your pain is severe.  You have side effects from your medicines.  You have trouble sleeping.  Your condition is causing depression or anxiety. Summary  Myofascial pain syndrome and fibromyalgia are pain disorders.  Myofascial pain syndrome has tender points in the muscle that will cause pain when pressed (trigger points). Fibromyalgia also has muscle pains and tenderness that come and go,  but this condition is often associated with fatigue and sleep disturbances.  Fibromyalgia and myofascial pain syndrome are not the same but often occur together, causing pain and fatigue that make day-to-day activities difficult.  Treatment for fibromyalgia includes taking medicines to relax the muscles and medicines for pain, depression, or seizures. Treatment for myofascial pain syndrome includes taking medicines for pain, cooling and stretching of muscles, and injecting medicines into trigger points.  Follow your health care provider's instructions for taking medicines and maintaining a healthy lifestyle. This information is not intended to replace advice given to you by your health care provider. Make sure you discuss any questions you have with your health care provider. Document Released: 10/11/2005 Document Revised: 10/26/2017 Document Reviewed: 10/26/2017 Elsevier Interactive Patient Education  2019 Elsevier Inc.       Edwina Barth, MD [facility]

## 2018-11-24 NOTE — Patient Instructions (Addendum)
   If you have lab work done today you will be contacted with your lab results within the next 2 weeks.  If you have not heard from us then please contact us. The fastest way to get your results is to register for My Chart.   IF you received an x-ray today, you will receive an invoice from [facility]. Please contact [facility] at [phone] with questions or concerns regarding your invoice.   IF you received labwork today, you will receive an invoice from [facility]. Please contact [facility] at [phone] with questions or concerns regarding your invoice.   Our billing staff will not be able to assist you with questions regarding bills from these companies.  You will be contacted with the lab results as soon as they are available. The fastest way to get your results is to activate your My Chart account. Instructions are located on the last page of this paperwork. If you have not heard from us regarding the results in 2 weeks, please contact this office.     Myofascial Pain Syndrome and Fibromyalgia Myofascial pain syndrome and fibromyalgia are both pain disorders. This pain may be felt mainly in your muscles.  Myofascial pain syndrome: ? Always has tender points in the muscle that will cause pain when pressed (trigger points). The pain may come and go. ? Usually affects your neck, upper back, and shoulder areas. The pain often radiates into your arms and hands.  Fibromyalgia: ? Has muscle pains and tenderness that come and go. ? Is often associated with fatigue and sleep problems. ? Has trigger points. ? Tends to be long-lasting (chronic), but is not life-threatening. Fibromyalgia and myofascial pain syndrome are not the same. However, they often occur together. If you have both conditions, each can make the other worse. Both are common and can cause enough pain and fatigue to make day-to-day activities difficult. Both can be hard to diagnose because their symptoms  are common in many other conditions. What are the causes? The exact causes of these conditions are not known. What increases the risk? You are more likely to develop this condition if:  You have a family history of the condition.  You have certain triggers, such as: ? Spine disorders. ? An injury (trauma) or other physical stressors. ? Being under a lot of stress. ? Medical conditions such as osteoarthritis, rheumatoid arthritis, or lupus. What are the signs or symptoms? Fibromyalgia The main symptom of fibromyalgia is widespread pain and tenderness in your muscles. Pain is sometimes described as stabbing, shooting, or burning. You may also have:  Tingling or numbness.  Sleep problems and fatigue.  Problems with attention and concentration (fibro fog). Other symptoms may include:  Bowel and bladder problems.  Headaches.  Visual problems.  Problems with odors and noises.  Depression or mood changes.  Painful menstrual periods (dysmenorrhea).  Dry skin or eyes. These symptoms can vary over time. Myofascial pain syndrome Symptoms of myofascial pain syndrome include:  Tight, ropy bands of muscle.  Uncomfortable sensations in muscle areas. These may include aching, cramping, burning, numbness, tingling, and weakness.  Difficulty moving certain parts of the body freely (poor range of motion). How is this diagnosed? This condition may be diagnosed by your symptoms and medical history. You will also have a physical exam. In general:  Fibromyalgia is diagnosed if you have pain, fatigue, and other symptoms for more than 3 months, and symptoms cannot be explained by another condition.  Myofascial pain syndrome is   diagnosed if you have trigger points in your muscles, and those trigger points are tender and cause pain elsewhere in your body (referred pain). How is this treated? Treatment for these conditions depends on the type that you have.  For fibromyalgia: ? Pain  medicines, such as NSAIDs. ? Medicines for treating depression. ? Medicines for treating seizures. ? Medicines that relax the muscles.  For myofascial pain: ? Pain medicines, such as NSAIDs. ? Cooling and stretching of muscles. ? Trigger point injections. ? Sound wave (ultrasound) treatments to stimulate muscles. Treating these conditions often requires a team of health care providers. These may include:  Your primary care provider.  Physical therapist.  Complementary health care providers, such as massage therapists or acupuncturists.  Psychiatrist for cognitive behavioral therapy. Follow these instructions at home: Medicines  Take over-the-counter and prescription medicines only as told by your health care provider.  Do not drive or use heavy machinery while taking prescription pain medicine.  If you are taking prescription pain medicine, take actions to prevent or treat constipation. Your health care provider may recommend that you: ? Drink enough fluid to keep your urine pale yellow. ? Eat foods that are high in fiber, such as fresh fruits and vegetables, whole grains, and beans. ? Limit foods that are high in fat and processed sugars, such as fried or sweet foods. ? Take an over-the-counter or prescription medicine for constipation. Lifestyle   Exercise as directed by your health care provider or physical therapist.  Practice relaxation techniques to control your stress. You may want to try: ? Biofeedback. ? Visual imagery. ? Hypnosis. ? Muscle relaxation. ? Yoga. ? Meditation.  Maintain a healthy lifestyle. This includes eating a healthy diet and getting enough sleep.  Do not use any products that contain nicotine or tobacco, such as cigarettes and e-cigarettes. If you need help quitting, ask your health care provider. General instructions  Talk to your health care provider about complementary treatments, such as acupuncture or massage.  Consider joining a  support group with others who are diagnosed with this condition.  Do not do activities that stress or strain your muscles. This includes repetitive motions and heavy lifting.  Keep all follow-up visits as told by your health care provider. This is important. Where to find more information  National Fibromyalgia Association: www.fmaware.org  Arthritis Foundation: www.arthritis.org  American Chronic Pain Association: www.theacpa.org Contact a health care provider if:  You have new symptoms.  Your symptoms get worse or your pain is severe.  You have side effects from your medicines.  You have trouble sleeping.  Your condition is causing depression or anxiety. Summary  Myofascial pain syndrome and fibromyalgia are pain disorders.  Myofascial pain syndrome has tender points in the muscle that will cause pain when pressed (trigger points). Fibromyalgia also has muscle pains and tenderness that come and go, but this condition is often associated with fatigue and sleep disturbances.  Fibromyalgia and myofascial pain syndrome are not the same but often occur together, causing pain and fatigue that make day-to-day activities difficult.  Treatment for fibromyalgia includes taking medicines to relax the muscles and medicines for pain, depression, or seizures. Treatment for myofascial pain syndrome includes taking medicines for pain, cooling and stretching of muscles, and injecting medicines into trigger points.  Follow your health care provider's instructions for taking medicines and maintaining a healthy lifestyle. This information is not intended to replace advice given to you by your health care provider. Make sure you discuss any questions   you have with your health care provider. Document Released: 10/11/2005 Document Revised: 10/26/2017 Document Reviewed: 10/26/2017 Elsevier Interactive Patient Education  2019 Elsevier Inc.   

## 2018-11-24 NOTE — Telephone Encounter (Signed)
Pt has apt today and will discuss then

## 2019-03-08 ENCOUNTER — Other Ambulatory Visit: Payer: Self-pay

## 2019-03-08 ENCOUNTER — Ambulatory Visit (INDEPENDENT_AMBULATORY_CARE_PROVIDER_SITE_OTHER): Payer: Medicare Other | Admitting: Emergency Medicine

## 2019-03-08 VITALS — BP 122/74 | Ht 66.0 in | Wt 194.0 lb

## 2019-03-08 DIAGNOSIS — Z0000 Encounter for general adult medical examination without abnormal findings: Secondary | ICD-10-CM | POA: Diagnosis not present

## 2019-03-08 NOTE — Patient Instructions (Addendum)
Thank you for taking time to come for your Medicare Wellness Visit. I appreciate your ongoing commitment to your health goals. Please review the following plan we discussed and let me know if I can assist you in the future.  Leroy Kennedy LPN   Advance Directive  Advance directives are legal documents that let you make choices ahead of time about your health care and medical treatment in case you become unable to communicate for yourself. Advance directives are a way for you to communicate your wishes to family, friends, and health care providers. This can help convey your decisions about end-of-life care if you become unable to communicate. Discussing and writing advance directives should happen over time rather than all at once. Advance directives can be changed depending on your situation and what you want, even after you have signed the advance directives. If you do not have an advance directive, some states assign family decision makers to act on your behalf based on how closely you are related to them. Each state has its own laws regarding advance directives. You may want to check with your health care provider, attorney, or state representative about the laws in your state. There are different types of advance directives, such as:  Medical power of attorney.  Living will.  Do not resuscitate (DNR) or do not attempt resuscitation (DNAR) order. Health care proxy and medical power of attorney A health care proxy, also called a health care agent, is a person who is appointed to make medical decisions for you in cases in which you are unable to make the decisions yourself. Generally, people choose someone they know well and trust to represent their preferences. Make sure to ask this person for an agreement to act as your proxy. A proxy may have to exercise judgment in the event of a medical decision for which your wishes are not known. A medical power of attorney is a legal document that names your  health care proxy. Depending on the laws in your state, after the document is written, it may also need to be:  Signed.  Notarized.  Dated.  Copied.  Witnessed.  Incorporated into your medical record. You may also want to appoint someone to manage your financial affairs in a situation in which you are unable to do so. This is called a durable power of attorney for finances. It is a separate legal document from the durable power of attorney for health care. You may choose the same person or someone different from your health care proxy to act as your agent in financial matters. If you do not appoint a proxy, or if there is a concern that the proxy is not acting in your best interests, a court-appointed guardian may be designated to act on your behalf. Living will A living will is a set of instructions documenting your wishes about medical care when you cannot express them yourself. Health care providers should keep a copy of your living will in your medical record. You may want to give a copy to family members or friends. To alert caregivers in case of an emergency, you can place a card in your wallet to let them know that you have a living will and where they can find it. A living will is used if you become:  Terminally ill.  Incapacitated.  Unable to communicate or make decisions. Items to consider in your living will include:  The use or non-use of life-sustaining equipment, such as dialysis machines and breathing machines (  ventilators).  A DNR or DNAR order, which is the instruction not to use cardiopulmonary resuscitation (CPR) if breathing or heartbeat stops.  The use or non-use of tube feeding.  Withholding of food and fluids.  Comfort (palliative) care when the goal becomes comfort rather than a cure.  Organ and tissue donation. A living will does not give instructions for distributing your money and property if you should pass away. It is recommended that you seek the  advice of a lawyer when writing a will. Decisions about taxes, beneficiaries, and asset distribution will be legally binding. This process can relieve your family and friends of any concerns surrounding disputes or questions that may come up about the distribution of your assets. DNR or DNAR A DNR or DNAR order is a request not to have CPR in the event that your heart stops beating or you stop breathing. If a DNR or DNAR order has not been made and shared, a health care provider will try to help any patient whose heart has stopped or who has stopped breathing. If you plan to have surgery, talk with your health care provider about how your DNR or DNAR order will be followed if problems occur. Summary  Advance directives are the legal documents that allow you to make choices ahead of time about your health care and medical treatment in case you become unable to communicate for yourself.  The process of discussing and writing advance directives should happen over time. You can change the advance directives, even after you have signed them.  Advance directives include DNR or DNAR orders, living wills, and designating an agent as your medical power of attorney. This information is not intended to replace advice given to you by your health care provider. Make sure you discuss any questions you have with your health care provider. Document Released: 01/18/2008 Document Revised: 08/30/2016 Document Reviewed: 08/30/2016 Elsevier Interactive Patient Education  2019 [place] Following a healthy eating pattern may help you to achieve and maintain a healthy body weight, reduce the risk of chronic disease, and live a long and productive life. It is important to follow a healthy eating pattern at an appropriate calorie level for your body. Your nutritional needs should be met primarily through food by choosing a variety of nutrient-rich foods. What are tips for following this plan? Reading food  labels  Read labels and choose the following: ? Reduced or [place] sodium. ? Juices with 100% fruit juice. ? Foods with [place] saturated fats and high polyunsaturated and monounsaturated fats. ? Foods with whole grains, such as whole wheat, cracked wheat, brown rice, and wild rice. ? Whole grains that are fortified with folic acid. This is recommended for women who are pregnant or who want to become pregnant.  Read labels and avoid the following: ? Foods with a lot of added sugars. These include foods that contain brown sugar, corn sweetener, corn syrup, dextrose, fructose, glucose, high-fructose corn syrup, honey, invert sugar, lactose, malt syrup, maltose, molasses, raw sugar, sucrose, trehalose, or turbinado sugar.  Do not eat more than the following amounts of added sugar per day:  6 teaspoons (25 g) for women.  9 teaspoons (38 g) for men. ? Foods that contain processed or refined starches and grains. ? Refined grain products, such as white flour, degermed cornmeal, white bread, and white rice. Shopping  Choose nutrient-rich snacks, such as vegetables, whole fruits, and nuts. Avoid high-calorie and high-sugar snacks, such as potato chips, fruit snacks, and candy.  candy.  Use oil-based dressings and spreads on foods instead of solid fats such as butter, stick margarine, or cream cheese.  Limit pre-made sauces, mixes, and "instant" products such as flavored rice, instant noodles, and ready-made pasta.  Try more plant-protein sources, such as tofu, tempeh, black beans, edamame, lentils, nuts, and seeds.  Explore eating plans such as the Mediterranean diet or vegetarian diet. Cooking  Use oil to saut or stir-fry foods instead of solid fats such as butter, stick margarine, or lard.  Try baking, boiling, grilling, or broiling instead of frying.  Remove the fatty part of meats before cooking.  Steam vegetables in water or broth. Meal planning   At meals, imagine dividing your  plate into fourths: ? One-half of your plate is fruits and vegetables. ? One-fourth of your plate is whole grains. ? One-fourth of your plate is protein, especially lean meats, poultry, eggs, tofu, beans, or nuts.  Include [place]-fat dairy as part of your daily diet. Lifestyle  Choose healthy options in all settings, including home, work, school, restaurants, or stores.  Prepare your food safely: ? Wash your hands after handling raw meats. ? Keep food preparation surfaces clean by regularly washing with hot, soapy water. ? Keep raw meats separate from ready-to-eat foods, such as fruits and vegetables. ? Cook seafood, meat, poultry, and eggs to the recommended internal temperature. ? Store foods at safe temperatures. In general:  Keep cold foods at 40F (4.4C) or below.  Keep hot foods at 140F (60C) or above.  Keep your freezer at 0F (-17.8C) or below.  Foods are no longer safe to eat when they have been between the temperatures of 40-140F (4.4-60C) for more than 2 hours. What foods should I eat? Fruits Aim to eat 2 cup-equivalents of fresh, canned (in natural juice), or frozen fruits each day. Examples of 1 cup-equivalent of fruit include 1 small apple, 8 large strawberries, 1 cup canned fruit,  cup dried fruit, or 1 cup 100% juice. Vegetables Aim to eat 2-3 cup-equivalents of fresh and frozen vegetables each day, including different varieties and colors. Examples of 1 cup-equivalent of vegetables include 2 medium carrots, 2 cups raw, leafy greens, 1 cup chopped vegetable (raw or cooked), or 1 medium baked potato. Grains Aim to eat 6 ounce-equivalents of whole grains each day. Examples of 1 ounce-equivalent of grains include 1 slice of bread, 1 cup ready-to-eat cereal, 3 cups popcorn, or  cup cooked rice, pasta, or cereal. Meats and other proteins Aim to eat 5-6 ounce-equivalents of protein each day. Examples of 1 ounce-equivalent of protein include 1 egg, 1/2 cup nuts or  seeds, or 1 tablespoon (16 g) peanut butter. A cut of meat or fish that is the size of a deck of cards is about 3-4 ounce-equivalents.  Of the protein you eat each week, try to have at least 8 ounces come from seafood. This includes salmon, trout, herring, and anchovies. Dairy Aim to eat 3 cup-equivalents of fat-free or [place]-fat dairy each day. Examples of 1 cup-equivalent of dairy include 1 cup (240 mL) milk, 8 ounces (250 g) yogurt, 1 ounces (44 g) natural cheese, or 1 cup (240 mL) fortified soy milk. Fats and oils  Aim for about 5 teaspoons (21 g) per day. Choose monounsaturated fats, such as canola and olive oils, avocados, peanut butter, and most nuts, or polyunsaturated fats, such as sunflower, corn, and soybean oils, walnuts, pine nuts, sesame seeds, sunflower seeds, and flaxseed. Beverages  Aim for six 8-oz glasses of   day. Limit coffee to three to five 8-oz cups per day.  Limit caffeinated beverages that have added calories, such as soda and energy drinks.  Limit alcohol intake to no more than 1 drink a day for nonpregnant women and 2 drinks a day for men. One drink equals 12 oz of beer (355 mL), 5 oz of wine (148 mL), or 1 oz of hard liquor (44 mL). Seasoning and other foods  Avoid adding excess amounts of salt to your foods. Try flavoring foods with herbs and spices instead of salt.  Avoid adding sugar to foods.  Try using oil-based dressings, sauces, and spreads instead of solid fats. This information is based on general [place] nutrition guidelines. For more information, visit [employer]. Exact amounts may vary based on your nutrition needs. Summary  A healthy eating plan may help you to maintain a healthy weight, reduce the risk of chronic diseases, and stay active throughout your life.  Plan your meals. Make sure you eat the right portions of a variety of nutrient-rich foods.  Try baking, boiling, grilling, or broiling instead of frying.  Choose healthy options in all  settings, including home, work, school, restaurants, or stores. This information is not intended to replace advice given to you by your health care provider. Make sure you discuss any questions you have with your health care provider. Document Released: 01/23/2018 Document Revised: 01/23/2018 Document Reviewed: 01/23/2018 Elsevier Interactive Patient Education  2019 [employer].

## 2019-03-08 NOTE — Progress Notes (Addendum)
Presents today for [employer] Visit   Date of last exam: 11/24/2018  Interpreter used for this visit? No  Two authenticators used.  Telemed visit  Patient Care Team: Horald Pollen, MD as PCP - General (Internal Medicine)   Other items to address today:   Discussed eye/dental hygiene Discussed immunizations Appointment scheduled for 04-2019 for follow up.       Other Screening: Last screening for diabetes: Last lipid screening: 08/05/2017  ADVANCE DIRECTIVES: Discussed: yes On File: no Materials Provided: yes (mailed)  Immunization status:  Immunization History  Administered Date(s) Administered  . MMR 11/23/2009  . Pneumococcal Conjugate-13 09/09/2014  . Td 11/23/2002  . Tdap 07/31/2012     There are no preventive care reminders to display for this patient.   Functional Status Survey: Is the patient deaf or have difficulty hearing?: No Does the patient have difficulty seeing, even when wearing glasses/contacts?: No Does the patient have difficulty concentrating, remembering, or making decisions?: No Does the patient have difficulty walking or climbing stairs?: No Does the patient have difficulty dressing or bathing?: No Does the patient have difficulty doing errands alone such as visiting a doctor's office or shopping?: No   6CIT Screen 03/08/2019  What Year? 0 points  What month? 0 points  What time? 0 points  Count back from 20 0 points  Months in reverse 0 points  Repeat phrase 0 points  Total Score 0        Clinical Support from 03/08/2019 in [facility]  AUDIT-C Score  0       Home Environment:    Lives in 3 level home   No trouble climbing stairs No scattered rugs Yes grab bars    Patient Active Problem List   Diagnosis Date Noted  . Chronic pain syndrome 08/05/2017  . Vitamin D deficiency 08/05/2017  . Generalized anxiety disorder 09/09/2014  . Hypothyroid 11/24/2011  . Migraine  11/24/2011  . Environmental allergies 11/24/2011  . Fibromyalgia 11/24/2011  . Irritable bowel syndrome (IBS) 11/24/2011  . Osteoporosis 11/24/2011     Past Medical History:  Diagnosis Date  . Allergy   . Arthritis   . Asthma   . Fibromyalgia   . Fibromyalgia muscle pain   . Neuromuscular disorder ([place])   . Osteoporosis   . Thyroid disease      History reviewed. No pertinent surgical history.   Family History  Problem Relation Age of Onset  . Heart disease Mother   . Hypertension Mother   . Hyperlipidemia Mother   . Hearing loss Mother   . Kidney disease Mother   . Vision loss Mother   . Lung disease Mother   . Stroke Father   . Arthritis Sister   . Meniere's disease Brother   . Cancer Maternal Grandmother   . Stroke Maternal Grandmother   . Heart disease Maternal Grandfather   . Heart disease Maternal Uncle      Social History   Socioeconomic History  . Marital status: Unknown    Spouse name: Not on file  . Number of children: Not on file  . Years of education: Not on file  . Highest education level: Not on file  Occupational History  . Not on file  Social Needs  . Financial resource strain: Not on file  . Food insecurity:    Worry: Not on file    Inability: Not on file  . Transportation needs:    Medical: Not  on file    Non-medical: Not on file  Tobacco Use  . Smoking status: Never Smoker  . Smokeless tobacco: Never Used  Substance and Sexual Activity  . Alcohol use: Not on file  . Drug use: Not on file  . Sexual activity: Not on file  Lifestyle  . Physical activity:    Days per week: Not on file    Minutes per session: Not on file  . Stress: Not on file  Relationships  . Social connections:    Talks on phone: Not on file    Gets together: Not on file    Attends religious service: Not on file    Active member of club or organization: Not on file    Attends meetings of clubs or organizations: Not on file    Relationship status: Not on  file  . Intimate partner violence:    Fear of current or ex partner: Not on file    Emotionally abused: Not on file    Physically abused: Not on file    Forced sexual activity: Not on file  Other Topics Concern  . Not on file  Social History Narrative  . Not on file     Allergies  Allergen Reactions  . Almotriptan Malate     Chest pain  . Eggs Or Egg-Derived Products     Stomach pain  . Imitrex [Sumatriptan Base]   . Penicillins   . Relpax [Eletriptan Hydrobromide] Hypertension  . Sulfa Drugs Cross Reactors   . Vantin     dizziness  . Azithromycin Anxiety     Prior to Admission medications   Medication Sig Start Date End Date Taking? Authorizing Provider  aspirin 81 MG tablet Take 81 mg by mouth daily.   Yes [provider]  levothyroxine (SYNTHROID, LEVOTHROID) 50 MCG tablet Take 1 tablet (50 mcg total) by mouth daily. 03/15/18 03/08/19 Yes Sagardia, Ines Bloomer, MD  loratadine (CLARITIN) 10 MG tablet Take 10 mg by mouth daily.   Yes [provider]  traMADol (ULTRAM) 50 MG tablet Take 1 tablet (50 mg total) by mouth 2 (two) times daily. As needed. 11/24/18  Yes Sagardia, Ines Bloomer, MD  Biotin 1 MG CAPS Take by mouth.    [provider]  calcium-vitamin D (OSCAL WITH D) 500-200 MG-UNIT per tablet Take 1 tablet by mouth.    [provider]  co-enzyme Q-10 30 MG capsule Take 30 mg by mouth 3 (three) times daily.    [provider]  Vitamin D, Ergocalciferol, (DRISDOL) 50000 units CAPS capsule Take 1 capsule (50,000 Units total) by mouth every 7 (seven) days. Patient not taking: Reported on 11/24/2018 11/15/16   Scot Jun, FNP     Depression screen [facility] 2/9 11/24/2018 03/15/2018 08/05/2017 02/14/2017 11/13/2016  Decreased Interest 0 0 0 0 0  Down, Depressed, Hopeless 0 0 0 0 0  PHQ - 2 Score 0 0 0 0 0     Fall Risk  03/08/2019 11/24/2018 03/15/2018 08/05/2017 02/14/2017  Falls in the past year? 0 0 No No No  Number falls in  past yr: 0 - - - -  Injury with Fall? 0 - - - -  Follow up - Falls evaluation completed;Falls prevention discussed - - -      PHYSICAL EXAM: BP 122/74   Ht 5' 6"  (1.676 m)   Wt 194 lb (88 kg)   BMI 31.31 kg/m    Wt Readings from Last 3 Encounters:  03/08/19 194  lb (88 kg)  11/24/18 194 lb 3.2 oz (88.1 kg)  03/15/18 196 lb (88.9 kg)     No exam data present    Physical Exam   Education/Counseling provided regarding diet and exercise, prevention of chronic diseases, smoking/tobacco cessation, if applicable, and reviewed "Covered Medicare Preventive Services."   ASSESSMENT/PLAN: There are no diagnoses linked to this encounter.   I have reviewed and agree with the above AWV documentation. Agustina Caroli, MD

## 2019-04-17 ENCOUNTER — Ambulatory Visit: Payer: Medicare Other | Admitting: Emergency Medicine

## 2019-04-24 ENCOUNTER — Other Ambulatory Visit: Payer: Self-pay | Admitting: Emergency Medicine

## 2019-04-24 DIAGNOSIS — E039 Hypothyroidism, unspecified: Secondary | ICD-10-CM

## 2019-05-08 ENCOUNTER — Encounter: Payer: Self-pay | Admitting: Emergency Medicine

## 2019-05-08 ENCOUNTER — Ambulatory Visit (INDEPENDENT_AMBULATORY_CARE_PROVIDER_SITE_OTHER): Payer: Medicare Other | Admitting: Emergency Medicine

## 2019-05-08 ENCOUNTER — Other Ambulatory Visit: Payer: Self-pay

## 2019-05-08 VITALS — BP 138/77 | HR 80 | Temp 98.2°F | Resp 16 | Ht 66.0 in | Wt 195.0 lb

## 2019-05-08 DIAGNOSIS — E559 Vitamin D deficiency, unspecified: Secondary | ICD-10-CM

## 2019-05-08 DIAGNOSIS — E039 Hypothyroidism, unspecified: Secondary | ICD-10-CM | POA: Diagnosis not present

## 2019-05-08 DIAGNOSIS — R531 Weakness: Secondary | ICD-10-CM

## 2019-05-08 DIAGNOSIS — M797 Fibromyalgia: Secondary | ICD-10-CM

## 2019-05-08 DIAGNOSIS — G894 Chronic pain syndrome: Secondary | ICD-10-CM | POA: Diagnosis not present

## 2019-05-08 DIAGNOSIS — Z13220 Encounter for screening for lipoid disorders: Secondary | ICD-10-CM

## 2019-05-08 NOTE — Patient Instructions (Addendum)
   If you have lab work done today you will be contacted with your lab results within the next 2 weeks.  If you have not heard from us then please contact us. The fastest way to get your results is to register for My Chart.   IF you received an x-ray today, you will receive an invoice from [facility]. Please contact [facility] at [phone] with questions or concerns regarding your invoice.   IF you received labwork today, you will receive an invoice from [facility]. Please contact [facility] at [phone] with questions or concerns regarding your invoice.   Our billing staff will not be able to assist you with questions regarding bills from these companies.  You will be contacted with the lab results as soon as they are available. The fastest way to get your results is to activate your My Chart account. Instructions are located on the last page of this paperwork. If you have not heard from us regarding the results in 2 weeks, please contact this office.      Health Maintenance, Female Adopting a healthy lifestyle and getting preventive care are important in promoting health and wellness. Ask your health care provider about:  The right schedule for you to have regular tests and exams.  Things you can do on your own to prevent diseases and keep yourself healthy. What should I know about diet, weight, and exercise? Eat a healthy diet   Eat a diet that includes plenty of vegetables, fruits, [place]-fat dairy products, and lean protein.  Do not eat a lot of foods that are high in solid fats, added sugars, or sodium. Maintain a healthy weight Body mass index (BMI) is used to identify weight problems. It estimates body fat based on height and weight. Your health care provider can help determine your BMI and help you achieve or maintain a healthy weight. Get regular exercise Get regular exercise. This is one of the most important things you can do for your health. Most  adults should:  Exercise for at least 150 minutes each week. The exercise should increase your heart rate and make you sweat (moderate-intensity exercise).  Do strengthening exercises at least twice a week. This is in addition to the moderate-intensity exercise.  Spend less time sitting. Even light physical activity can be beneficial. Watch cholesterol and blood lipids Have your blood tested for lipids and cholesterol at 73 years of age, then have this test every 5 years. Have your cholesterol levels checked more often if:  Your lipid or cholesterol levels are high.  You are older than 73 years of age.  You are at high risk for heart disease. What should I know about cancer screening? Depending on your health history and family history, you may need to have cancer screening at various ages. This may include screening for:  Breast cancer.  Cervical cancer.  Colorectal cancer.  Skin cancer.  Lung cancer. What should I know about heart disease, diabetes, and high blood pressure? Blood pressure and heart disease  High blood pressure causes heart disease and increases the risk of stroke. This is more likely to develop in people who have high blood pressure readings, are of African descent, or are overweight.  Have your blood pressure checked: ? Every 3-5 years if you are 18-39 years of age. ? Every year if you are 40 years old or older. Diabetes Have regular diabetes screenings. This checks your fasting blood sugar level. Have the screening done:  Once every   three years after age 40 if you are at a normal weight and have a [place] risk for diabetes.  More often and at a younger age if you are overweight or have a high risk for diabetes. What should I know about preventing infection? Hepatitis B If you have a higher risk for hepatitis B, you should be screened for this virus. Talk with your health care provider to find out if you are at risk for hepatitis B infection. Hepatitis  C Testing is recommended for:  Everyone born from 1945 through 1965.  Anyone with known risk factors for hepatitis C. Sexually transmitted infections (STIs)  Get screened for STIs, including gonorrhea and chlamydia, if: ? You are sexually active and are younger than 73 years of age. ? You are older than 73 years of age and your health care provider tells you that you are at risk for this type of infection. ? Your sexual activity has changed since you were last screened, and you are at increased risk for chlamydia or gonorrhea. Ask your health care provider if you are at risk.  Ask your health care provider about whether you are at high risk for HIV. Your health care provider may recommend a prescription medicine to help prevent HIV infection. If you choose to take medicine to prevent HIV, you should first get tested for HIV. You should then be tested every 3 months for as long as you are taking the medicine. Pregnancy  If you are about to stop having your period (premenopausal) and you may become pregnant, seek counseling before you get pregnant.  Take 400 to 800 micrograms (mcg) of folic acid every day if you become pregnant.  Ask for birth control (contraception) if you want to prevent pregnancy. Osteoporosis and menopause Osteoporosis is a disease in which the bones lose minerals and strength with aging. This can result in bone fractures. If you are 65 years old or older, or if you are at risk for osteoporosis and fractures, ask your health care provider if you should:  Be screened for bone loss.  Take a calcium or vitamin D supplement to lower your risk of fractures.  Be given hormone replacement therapy (HRT) to treat symptoms of menopause. Follow these instructions at home: Lifestyle  Do not use any products that contain nicotine or tobacco, such as cigarettes, e-cigarettes, and chewing tobacco. If you need help quitting, ask your health care provider.  Do not use street  drugs.  Do not share needles.  Ask your health care provider for help if you need support or information about quitting drugs. Alcohol use  Do not drink alcohol if: ? Your health care provider tells you not to drink. ? You are pregnant, may be pregnant, or are planning to become pregnant.  If you drink alcohol: ? Limit how much you use to 0-1 drink a day. ? Limit intake if you are breastfeeding.  Be aware of how much alcohol is in your drink. In the [place] one drink equals one 12 oz bottle of beer (355 mL), one 5 oz glass of wine (148 mL), or one 1 oz glass of hard liquor (44 mL). General instructions  Schedule regular health, dental, and eye exams.  Stay current with your vaccines.  Tell your health care provider if: ? You often feel depressed. ? You have ever been abused or do not feel safe at home. Summary  Adopting a healthy lifestyle and getting preventive care are important in promoting health and wellness.    Follow your health care provider's instructions about healthy diet, exercising, and getting tested or screened for diseases.  Follow your health care provider's instructions on monitoring your cholesterol and blood pressure. This information is not intended to replace advice given to you by your health care provider. Make sure you discuss any questions you have with your health care provider. Document Released: 04/26/2011 Document Revised: 10/04/2018 Document Reviewed: 10/04/2018 Elsevier Patient Education  2020 Elsevier Inc.  

## 2019-05-08 NOTE — Progress Notes (Signed)
Jacqueline Wyatt 73 y.o.   Chief Complaint  Patient presents with  . Thyroid Problem    3 month check/ pt would like to get bloodwork    HISTORY OF PRESENT ILLNESS: This is a 73 y.o. female with history of fibromyalgia and chronic pain syndrome as well as chronic fatigue and tiredness but worse the past several weeks.  No changes in her lifestyle, diet, medications, or personal habits.  Also has a history of hypothyroidism on Synthroid 50 mcg daily.  Needs blood work.  Denies flulike symptoms although she has 2 "strange colds" last December and then again in February.  Had typical flulike symptoms that lasted about a week each.  Nothing like influenza she had in 2015.  Denies any new chronic symptoms.  Feels like it is all secondary to her chronic fibromyalgia.  HPI   Prior to Admission medications   Medication Sig Start Date End Date Taking? Authorizing Provider  levothyroxine (SYNTHROID) 50 MCG tablet Take 1 tablet (50 mcg total) by mouth daily before breakfast. KEEP UPCOMING APPOINTMENT 04/25/19  Yes Izyk Marty, Ines Bloomer, MD  loratadine (CLARITIN) 10 MG tablet Take 10 mg by mouth daily.   Yes [provider]  traMADol (ULTRAM) 50 MG tablet Take 1 tablet (50 mg total) by mouth 2 (two) times daily. As needed. 11/24/18  Yes SagardiaInes Bloomer, MD  aspirin 81 MG tablet Take 81 mg by mouth daily.    [provider]  Biotin 1 MG CAPS Take by mouth.    [provider]  calcium-vitamin D (OSCAL WITH D) 500-200 MG-UNIT per tablet Take 1 tablet by mouth.    [provider]  co-enzyme Q-10 30 MG capsule Take 30 mg by mouth 3 (three) times daily.    [provider]  Vitamin D, Ergocalciferol, (DRISDOL) 50000 units CAPS capsule Take 1 capsule (50,000 Units total) by mouth every 7 (seven) days. Patient not taking: Reported on 11/24/2018 11/15/16   Scot Jun, FNP    Allergies  Allergen Reactions  . Almotriptan Malate     Chest pain  . Eggs Or  Egg-Derived Products     Stomach pain  . Imitrex [Sumatriptan Base]   . Penicillins   . Relpax [Eletriptan Hydrobromide] Hypertension  . Sulfa Drugs Cross Reactors   . Vantin     dizziness  . Azithromycin Anxiety    Patient Active Problem List   Diagnosis Date Noted  . Chronic pain syndrome 08/05/2017  . Vitamin D deficiency 08/05/2017  . Generalized anxiety disorder 09/09/2014  . Hypothyroid 11/24/2011  . Migraine 11/24/2011  . Environmental allergies 11/24/2011  . Fibromyalgia 11/24/2011  . Irritable bowel syndrome (IBS) 11/24/2011  . Osteoporosis 11/24/2011    Past Medical History:  Diagnosis Date  . Allergy   . Arthritis   . Asthma   . Fibromyalgia   . Fibromyalgia muscle pain   . Neuromuscular disorder ([place])   . Osteoporosis   . Thyroid disease     History reviewed. No pertinent surgical history.  Social History   Socioeconomic History  . Marital status: Unknown    Spouse name: Not on file  . Number of children: Not on file  . Years of education: Not on file  . Highest education level: Not on file  Occupational History  . Not on file  Social Needs  . Financial resource strain: Not on file  . Food insecurity    Worry: Not on file    Inability: Not on file  .  Transportation needs    Medical: Not on file    Non-medical: Not on file  Tobacco Use  . Smoking status: Never Smoker  . Smokeless tobacco: Never Used  Substance and Sexual Activity  . Alcohol use: Not on file  . Drug use: Not on file  . Sexual activity: Not on file  Lifestyle  . Physical activity    Days per week: Not on file    Minutes per session: Not on file  . Stress: Not on file  Relationships  . Social Herbalist on phone: Not on file    Gets together: Not on file    Attends religious service: Not on file    Active member of club or organization: Not on file    Attends meetings of clubs or organizations: Not on file    Relationship status: Not on file  . Intimate  partner violence    Fear of current or ex partner: Not on file    Emotionally abused: Not on file    Physically abused: Not on file    Forced sexual activity: Not on file  Other Topics Concern  . Not on file  Social History Narrative  . Not on file    Family History  Problem Relation Age of Onset  . Heart disease Mother   . Hypertension Mother   . Hyperlipidemia Mother   . Hearing loss Mother   . Kidney disease Mother   . Vision loss Mother   . Lung disease Mother   . Stroke Father   . Arthritis Sister   . Meniere's disease Brother   . Cancer Maternal Grandmother   . Stroke Maternal Grandmother   . Heart disease Maternal Grandfather   . Heart disease Maternal Uncle      Review of Systems  Constitutional: Positive for malaise/fatigue. Negative for chills and fever.  HENT: Negative.  Negative for congestion, nosebleeds and sore throat.   Eyes: Negative.  Negative for blurred vision and double vision.  Respiratory: Negative.  Negative for cough and shortness of breath.   Cardiovascular: Negative.  Negative for chest pain, palpitations and leg swelling.  Gastrointestinal: Negative.  Negative for abdominal pain, diarrhea, nausea and vomiting.  Genitourinary: Negative.  Negative for dysuria and hematuria.  Musculoskeletal: Negative.  Negative for back pain, joint pain, myalgias and neck pain.  Skin: Negative.  Negative for rash.  Neurological: Negative for dizziness, sensory change, focal weakness, seizures, loss of consciousness and headaches.  Endo/Heme/Allergies: Negative.   All other systems reviewed and are negative.  Vitals:   05/08/19 1118  BP: 138/77  Pulse: 80  Resp: 16  Temp: 98.2 F (36.8 C)  SpO2: 97%     Physical Exam Vitals signs reviewed.  Constitutional:      Appearance: Normal appearance.  HENT:     Head: Normocephalic and atraumatic.  Eyes:     Extraocular Movements: Extraocular movements intact.     Conjunctiva/sclera: Conjunctivae normal.      Pupils: Pupils are equal, round, and reactive to light.  Neck:     Musculoskeletal: Normal range of motion and neck supple.  Cardiovascular:     Rate and Rhythm: Normal rate and regular rhythm.     Heart sounds: Normal heart sounds.  Pulmonary:     Effort: Pulmonary effort is normal.     Breath sounds: Normal breath sounds.  Abdominal:     Palpations: Abdomen is soft.     Tenderness: There is no abdominal tenderness.  Musculoskeletal: Normal range of motion.  Skin:    General: Skin is warm and dry.     Capillary Refill: Capillary refill takes less than 2 seconds.  Neurological:     General: No focal deficit present.     Mental Status: She is alert and oriented to person, place, and time.  Psychiatric:        Mood and Affect: Mood normal.        Behavior: Behavior normal.      ASSESSMENT & PLAN: Rokia was seen today for thyroid problem.  Diagnoses and all orders for this visit:  General weakness -     CMP14+EGFR -     CBC with Differential/Platelet  Fibromyalgia  Hypothyroidism, unspecified type -     TSH  Chronic pain syndrome  Vitamin D deficiency -     VITAMIN D 25 Hydroxy (Vit-D Deficiency, Fractures)  Screening for lipoid disorders -     Lipid panel    Patient Instructions       If you have lab work done today you will be contacted with your lab results within the next 2 weeks.  If you have not heard from [place] then please contact us. The fastest way to get your results is to register for My Chart.   IF you received an x-ray today, you will receive an invoice from [facility]. Please contact [facility] at [phone] with questions or concerns regarding your invoice.   IF you received labwork today, you will receive an invoice from [facility]. Please contact [facility] at [phone] with questions or concerns regarding your invoice.   Our billing staff will not be able to assist you with questions regarding bills from these  companies.  You will be contacted with the lab results as soon as they are available. The fastest way to get your results is to activate your My Chart account. Instructions are located on the last page of this paperwork. If you have not heard from [place] regarding the results in 2 weeks, please contact this office.      Health Maintenance, Female Adopting a healthy lifestyle and getting preventive care are important in promoting health and wellness. Ask your health care provider about:  The right schedule for you to have regular tests and exams.  Things you can do on your own to prevent diseases and keep yourself healthy. What should I know about diet, weight, and exercise? Eat a healthy diet   Eat a diet that includes plenty of vegetables, fruits, [place]-fat dairy products, and lean protein.  Do not eat a lot of foods that are high in solid fats, added sugars, or sodium. Maintain a healthy weight Body mass index (BMI) is used to identify weight problems. It estimates body fat based on height and weight. Your health care provider can help determine your BMI and help you achieve or maintain a healthy weight. Get regular exercise Get regular exercise. This is one of the most important things you can do for your health. Most adults should:  Exercise for at least 150 minutes each week. The exercise should increase your heart rate and make you sweat (moderate-intensity exercise).  Do strengthening exercises at least twice a week. This is in addition to the moderate-intensity exercise.  Spend less time sitting. Even light physical activity can be beneficial. Watch cholesterol and blood lipids Have your blood tested for lipids and cholesterol at 73 years of age, then have this test every 5 years. Have your cholesterol levels checked  more often if:  Your lipid or cholesterol levels are high.  You are older than 73 years of age.  You are at high risk for heart disease. What should I know about  cancer screening? Depending on your health history and family history, you may need to have cancer screening at various ages. This may include screening for:  Breast cancer.  Cervical cancer.  Colorectal cancer.  Skin cancer.  Lung cancer. What should I know about heart disease, diabetes, and high blood pressure? Blood pressure and heart disease  High blood pressure causes heart disease and increases the risk of stroke. This is more likely to develop in people who have high blood pressure readings, are of African descent, or are overweight.  Have your blood pressure checked: ? Every 3-5 years if you are 50-12 years of age. ? Every year if you are 23 years old or older. Diabetes Have regular diabetes screenings. This checks your fasting blood sugar level. Have the screening done:  Once every three years after age 70 if you are at a normal weight and have a [place] risk for diabetes.  More often and at a younger age if you are overweight or have a high risk for diabetes. What should I know about preventing infection? Hepatitis B If you have a higher risk for hepatitis B, you should be screened for this virus. Talk with your health care provider to find out if you are at risk for hepatitis B infection. Hepatitis C Testing is recommended for:  Everyone born from [birth year] through 1965.  Anyone with known risk factors for hepatitis C. Sexually transmitted infections (STIs)  Get screened for STIs, including gonorrhea and chlamydia, if: ? You are sexually active and are younger than 73 years of age. ? You are older than 73 years of age and your health care provider tells you that you are at risk for this type of infection. ? Your sexual activity has changed since you were last screened, and you are at increased risk for chlamydia or gonorrhea. Ask your health care provider if you are at risk.  Ask your health care provider about whether you are at high risk for HIV. Your health care  provider may recommend a prescription medicine to help prevent HIV infection. If you choose to take medicine to prevent HIV, you should first get tested for HIV. You should then be tested every 3 months for as long as you are taking the medicine. Pregnancy  If you are about to stop having your period (premenopausal) and you may become pregnant, seek counseling before you get pregnant.  Take 400 to 800 micrograms (mcg) of folic acid every day if you become pregnant.  Ask for birth control (contraception) if you want to prevent pregnancy. Osteoporosis and menopause Osteoporosis is a disease in which the bones lose minerals and strength with aging. This can result in bone fractures. If you are 2 years old or older, or if you are at risk for osteoporosis and fractures, ask your health care provider if you should:  Be screened for bone loss.  Take a calcium or vitamin D supplement to lower your risk of fractures.  Be given hormone replacement therapy (HRT) to treat symptoms of menopause. Follow these instructions at home: Lifestyle  Do not use any products that contain nicotine or tobacco, such as cigarettes, e-cigarettes, and chewing tobacco. If you need help quitting, ask your health care provider.  Do not use street drugs.  Do not share needles.  Ask your health care provider for help if you need support or information about quitting drugs. Alcohol use  Do not drink alcohol if: ? Your health care provider tells you not to drink. ? You are pregnant, may be pregnant, or are planning to become pregnant.  If you drink alcohol: ? Limit how much you use to 0-1 drink a day. ? Limit intake if you are breastfeeding.  Be aware of how much alcohol is in your drink. In the [place] one drink equals one 12 oz bottle of beer (355 mL), one 5 oz glass of wine (148 mL), or one 1 oz glass of hard liquor (44 mL). General instructions  Schedule regular health, dental, and eye exams.  Stay current  with your vaccines.  Tell your health care provider if: ? You often feel depressed. ? You have ever been abused or do not feel safe at home. Summary  Adopting a healthy lifestyle and getting preventive care are important in promoting health and wellness.  Follow your health care provider's instructions about healthy diet, exercising, and getting tested or screened for diseases.  Follow your health care provider's instructions on monitoring your cholesterol and blood pressure. This information is not intended to replace advice given to you by your health care provider. Make sure you discuss any questions you have with your health care provider. Document Released: 04/26/2011 Document Revised: 10/04/2018 Document Reviewed: 10/04/2018 Elsevier Patient Education  2020 Elsevier Inc.      Agustina Caroli, MD [facility]

## 2019-05-09 ENCOUNTER — Other Ambulatory Visit: Payer: Self-pay | Admitting: Emergency Medicine

## 2019-05-09 DIAGNOSIS — E559 Vitamin D deficiency, unspecified: Secondary | ICD-10-CM

## 2019-05-09 DIAGNOSIS — E785 Hyperlipidemia, unspecified: Secondary | ICD-10-CM

## 2019-05-09 LAB — TSH: TSH: 4.1 u[IU]/mL (ref 0.450–4.500)

## 2019-05-09 LAB — CBC WITH DIFFERENTIAL/PLATELET
Basophils Absolute: 0.1 10*3/uL (ref 0.0–0.2)
Basos: 1 %
EOS (ABSOLUTE): 0.2 10*3/uL (ref 0.0–0.4)
Eos: 3 %
Hematocrit: 42.4 % (ref 34.0–46.6)
Hemoglobin: 14.8 g/dL (ref 11.1–15.9)
Immature Grans (Abs): 0 10*3/uL (ref 0.0–0.1)
Immature Granulocytes: 0 %
Lymphocytes Absolute: 2 10*3/uL (ref 0.7–3.1)
Lymphs: 43 %
MCH: 33 pg (ref 26.6–33.0)
MCHC: 34.9 g/dL (ref 31.5–35.7)
MCV: 94 fL (ref 79–97)
Monocytes Absolute: 0.5 10*3/uL (ref 0.1–0.9)
Monocytes: 11 %
Neutrophils Absolute: 2 10*3/uL (ref 1.4–7.0)
Neutrophils: 42 %
Platelets: 240 10*3/uL (ref 150–450)
RBC: 4.49 x10E6/uL (ref 3.77–5.28)
RDW: 12.9 % (ref 11.7–15.4)
WBC: 4.7 10*3/uL (ref 3.4–10.8)

## 2019-05-09 LAB — CMP14+EGFR
ALT: 23 IU/L (ref 0–32)
AST: 28 IU/L (ref 0–40)
Albumin/Globulin Ratio: 1.4 (ref 1.2–2.2)
Albumin: 4.2 g/dL (ref 3.7–4.7)
Alkaline Phosphatase: 81 IU/L (ref 39–117)
BUN/Creatinine Ratio: 15 (ref 12–28)
BUN: 15 mg/dL (ref 8–27)
Bilirubin Total: 0.5 mg/dL (ref 0.0–1.2)
CO2: 23 mmol/L (ref 20–29)
Calcium: 10.5 mg/dL — ABNORMAL HIGH (ref 8.7–10.3)
Chloride: 102 mmol/L (ref 96–106)
Creatinine, Ser: 1.01 mg/dL — ABNORMAL HIGH (ref 0.57–1.00)
GFR calc Af Amer: 64 mL/min/{1.73_m2} (ref >59)
GFR calc non Af Amer: 55 mL/min/{1.73_m2} — ABNORMAL LOW (ref >59)
Globulin, Total: 3 g/dL (ref 1.5–4.5)
Glucose: 97 mg/dL (ref 65–99)
Potassium: 4.5 mmol/L (ref 3.5–5.2)
Sodium: 139 mmol/L (ref 134–144)
Total Protein: 7.2 g/dL (ref 6.0–8.5)

## 2019-05-09 LAB — LIPID PANEL
Chol/HDL Ratio: 3.5 ratio (ref 0.0–4.4)
Cholesterol, Total: 214 mg/dL — ABNORMAL HIGH (ref 100–199)
HDL: 62 mg/dL (ref >39)
LDL Calculated: 115 mg/dL — ABNORMAL HIGH (ref 0–99)
Triglycerides: 187 mg/dL — ABNORMAL HIGH (ref 0–149)
VLDL Cholesterol Cal: 37 mg/dL (ref 5–40)

## 2019-05-09 LAB — VITAMIN D 25 HYDROXY (VIT D DEFICIENCY, FRACTURES): Vit D, 25-Hydroxy: 20 ng/mL — ABNORMAL LOW (ref 30.0–100.0)

## 2019-05-09 MED ORDER — ROSUVASTATIN CALCIUM 10 MG PO TABS: 10.0000 mg | ORAL_TABLET | Freq: Every day | ORAL | 3 refills | Status: DC

## 2019-05-09 MED ORDER — VITAMIN D (ERGOCALCIFEROL) 1.25 MG (50000 UNIT) PO CAPS: 50000.0000 [IU] | ORAL_CAPSULE | ORAL | 12 refills | Status: DC

## 2019-05-15 ENCOUNTER — Telehealth: Payer: Self-pay | Admitting: Emergency Medicine

## 2019-05-15 NOTE — Telephone Encounter (Signed)
Pt dropped off DMV parking Placard paperwork to be filled out by provider Dr.Sagardia. Please mail to pt once completed . Thank you

## 2019-05-18 NOTE — Telephone Encounter (Signed)
Dr Mitchel Honour Patient would like a for her DMV form be mailed to her and a call letting her know its in the mail. Patient stated she dropped form off on 05/15/19

## 2019-05-19 NOTE — Telephone Encounter (Signed)
Not aware of this. Where is this form?

## 2019-05-23 ENCOUNTER — Telehealth: Payer: Self-pay | Admitting: *Deleted

## 2019-05-23 NOTE — Telephone Encounter (Signed)
Mailed completed handicap placard to patient, per request.

## 2019-05-25 ENCOUNTER — Other Ambulatory Visit: Payer: Self-pay | Admitting: Emergency Medicine

## 2019-05-25 DIAGNOSIS — M797 Fibromyalgia: Secondary | ICD-10-CM

## 2019-05-25 DIAGNOSIS — G894 Chronic pain syndrome: Secondary | ICD-10-CM

## 2019-05-25 NOTE — Telephone Encounter (Signed)
Requested medications are due for refill today?  Yes  Requested medications are on the active medication list?  Yes  Last refill 11/24/2018  Future visit scheduled?  Yes - 11/05/2019  Notes to clinic   Requested Prescriptions  Pending Prescriptions Disp Refills   traMADol (ULTRAM) 50 MG tablet [Pharmacy Med Name: TRAMADOL 50MG  TABLETS] 60 tablet     Sig: TAKE 1 TABLET(50 MG) BY MOUTH TWICE DAILY AS NEEDED     Not Delegated - Analgesics:  Opioid Agonists Failed - 05/25/2019  3:04 PM      Failed - This refill cannot be delegated      Failed - Urine Drug Screen completed in last 360 days.      Passed - Valid encounter within last 6 months    Recent Outpatient Visits          2 weeks ago General weakness   [facility] Care at [facility], Ines Bloomer, MD   2 months ago Medicare annual wellness visit, subsequent   Primary Care at [facility], Ines Bloomer, MD   6 months ago Fibromyalgia   Primary Care at [facility], Ines Bloomer, MD   1 year ago Fibromyalgia   Primary Care at [facility], Ines Bloomer, MD   1 year ago Fibromyalgia   Primary Care at [facility], Ines Bloomer, MD      Future Appointments            In 5 months [place], Ines Bloomer, MD Primary Care at [place], [facility]

## 2019-07-25 ENCOUNTER — Other Ambulatory Visit: Payer: Self-pay | Admitting: Emergency Medicine

## 2019-07-25 DIAGNOSIS — M797 Fibromyalgia: Secondary | ICD-10-CM

## 2019-07-25 DIAGNOSIS — G894 Chronic pain syndrome: Secondary | ICD-10-CM

## 2019-07-25 DIAGNOSIS — E039 Hypothyroidism, unspecified: Secondary | ICD-10-CM

## 2019-07-25 MED ORDER — LEVOTHYROXINE SODIUM 50 MCG PO TABS: 50.0000 ug | ORAL_TABLET | Freq: Every day | ORAL | 0 refills | Status: DC

## 2019-07-25 NOTE — Telephone Encounter (Signed)
Medication Refill - Medication: traMADol (ULTRAM) 50 MG tablet + levothyroxine (SYNTHROID) 50 MCG tablet   Has the patient contacted their pharmacy? Yes.   (Agent: If no, request that the patient contact the pharmacy for the refill.) (Agent: If yes, when and what did the pharmacy advise?)  Preferred Pharmacy (with phone number or street name):  [id number] [place], [place] [facility] [place]  [address] [place] [address]  Phone: [phone] Fax: [phone]     Agent: Please be advised that RX refills may take up to 3 business days. We ask that you follow-up with your pharmacy.

## 2019-07-25 NOTE — Telephone Encounter (Signed)
Requested medication (s) are due for refill today: yes  Requested medication (s) are on the active medication list: yes  Last refill: 11/24/2018  #60 3 refills  Future visit scheduled Yes  Notes to clinic:not delegated  Requested Prescriptions  Pending Prescriptions Disp Refills   traMADol (ULTRAM) 50 MG tablet 60 tablet 3    Sig: Take 1 tablet (50 mg total) by mouth 2 (two) times daily. As needed.     Not Delegated - Analgesics:  Opioid Agonists Failed - 07/25/2019  4:18 PM      Failed - This refill cannot be delegated      Failed - Urine Drug Screen completed in last 360 days.      Passed - Valid encounter within last 6 months    Recent Outpatient Visits          2 months ago General weakness   Primary Care at [facility], Ines Bloomer, MD   4 months ago Medicare annual wellness visit, subsequent   Primary Care at [facility], Ines Bloomer, MD   8 months ago Fibromyalgia   Primary Care at [facility], Ines Bloomer, MD   1 year ago Fibromyalgia   Primary Care at [place], Ines Bloomer, MD   1 year ago Fibromyalgia   Primary Care at [place], [place], [place]            In 3 months [place], Ines Bloomer, MD Primary Care at [place], [facility]           Signed Prescriptions Disp Refills   levothyroxine (SYNTHROID) 50 MCG tablet 90 tablet 0    Sig: Take 1 tablet (50 mcg total) by mouth daily before breakfast. [place]     Endocrinology:  Hypothyroid Agents Failed - 07/25/2019  4:18 PM      Failed - TSH needs to be rechecked within 3 months after an abnormal result. Refill until TSH is due.      Passed - TSH in normal range and within 360 days    TSH  Date Value Ref Range Status  05/08/2019 4.100 0.450 - 4.500 uIU/mL Final         Passed - Valid encounter within last 12 months    Recent Outpatient Visits          2 months ago General weakness   Primary Care at [facility], Ines Bloomer, MD   4 months  ago Medicare annual wellness visit, subsequent   Primary Care at [facility], Ines Bloomer, MD   8 months ago Fibromyalgia   Primary Care at [place], Ines Bloomer, MD   1 year ago Fibromyalgia   Primary Care at [facility], Ines Bloomer, MD   1 year ago Fibromyalgia   Primary Care at [facility], Ines Bloomer, MD      Future Appointments            In 3 months [place], Ines Bloomer, MD Primary Care at [place], [facility]

## 2019-07-26 MED ORDER — TRAMADOL HCL 50 MG PO TABS: 50.0000 mg | ORAL_TABLET | Freq: Two times a day (BID) | ORAL | 3 refills | Status: DC

## 2019-07-26 NOTE — Telephone Encounter (Signed)
Requested Prescriptions   Pending Prescriptions Disp Refills  . traMADol (ULTRAM) 50 MG tablet 60 tablet 3    Sig: Take 1 tablet (50 mg total) by mouth 2 (two) times daily. As needed.   Signed Prescriptions Disp Refills  . levothyroxine (SYNTHROID) 50 MCG tablet 90 tablet 0    Sig: Take 1 tablet (50 mcg total) by mouth daily before breakfast. KEEP UPCOMING APPOINTMENT    Authorizing Provider: Horald Pollen    Ordering User: Elliot Cousin    Last OV 05/08/2019  Last written 11/24/2018

## 2019-11-01 ENCOUNTER — Other Ambulatory Visit: Payer: Self-pay | Admitting: Emergency Medicine

## 2019-11-01 DIAGNOSIS — E039 Hypothyroidism, unspecified: Secondary | ICD-10-CM

## 2019-11-01 NOTE — Telephone Encounter (Signed)
Forwarding medication refill request to the clinical pool for review. 

## 2019-11-05 ENCOUNTER — Ambulatory Visit: Payer: Medicare Other | Admitting: Emergency Medicine

## 2019-11-07 ENCOUNTER — Encounter: Payer: Self-pay | Admitting: Emergency Medicine

## 2019-11-07 ENCOUNTER — Ambulatory Visit (INDEPENDENT_AMBULATORY_CARE_PROVIDER_SITE_OTHER): Payer: Medicare Other | Admitting: Emergency Medicine

## 2019-11-07 ENCOUNTER — Other Ambulatory Visit: Payer: Self-pay

## 2019-11-07 VITALS — BP 118/76 | HR 81 | Temp 98.6°F | Resp 16 | Ht 65.0 in | Wt 185.0 lb

## 2019-11-07 DIAGNOSIS — E785 Hyperlipidemia, unspecified: Secondary | ICD-10-CM

## 2019-11-07 DIAGNOSIS — Z1211 Encounter for screening for malignant neoplasm of colon: Secondary | ICD-10-CM

## 2019-11-07 DIAGNOSIS — N1831 Chronic kidney disease, stage 3a: Secondary | ICD-10-CM | POA: Insufficient documentation

## 2019-11-07 DIAGNOSIS — E7800 Pure hypercholesterolemia, unspecified: Secondary | ICD-10-CM | POA: Insufficient documentation

## 2019-11-07 DIAGNOSIS — G894 Chronic pain syndrome: Secondary | ICD-10-CM

## 2019-11-07 DIAGNOSIS — M797 Fibromyalgia: Secondary | ICD-10-CM

## 2019-11-07 DIAGNOSIS — E559 Vitamin D deficiency, unspecified: Secondary | ICD-10-CM

## 2019-11-07 DIAGNOSIS — E039 Hypothyroidism, unspecified: Secondary | ICD-10-CM

## 2019-11-07 NOTE — Progress Notes (Signed)
Wt Readings from Last 3 Encounters:  11/07/19 185 lb (83.9 kg)  05/08/19 195 lb (88.5 kg)  03/08/19 194 lb (88 kg)

## 2019-11-07 NOTE — Progress Notes (Signed)
Jacqueline Wyatt 74 y.o.   Chief Complaint  Patient presents with  . Fatigue    x 6 month   . Cough    per patient in the morning it is just there    HISTORY OF PRESENT ILLNESS: This is a 74 y.o. female with history of fibromyalgia and chronic pain syndrome here for follow-up.  Last visit with me in July 2020. Doing better.  Losing weight and eating better.  Exercising regularly. Has no complaints or medical concerns today. Did not start taking Crestor as recommended last time, fasting today, once lipid profile rechecked. History of [place] vitamin D on supplementation. Wt Readings from Last 3 Encounters:  11/07/19 185 lb (83.9 kg)  05/08/19 195 lb (88.5 kg)  03/08/19 194 lb (88 kg)    HPI   Prior to Admission medications   Medication Sig Start Date End Date Taking? Authorizing Provider  Biotin 1 MG CAPS Take by mouth.   Yes [provider]  co-enzyme Q-10 30 MG capsule Take 30 mg by mouth 3 (three) times daily.   Yes [provider]  levothyroxine (SYNTHROID) 50 MCG tablet TAKE 1 TABLET BY MOUTH EVERY DAY BEFORE BREAKFAST 11/02/19  Yes Jerman Tinnon, Ines Bloomer, MD  loratadine (CLARITIN) 10 MG tablet Take 10 mg by mouth daily.   Yes [provider]  traMADol (ULTRAM) 50 MG tablet Take 1 tablet (50 mg total) by mouth 2 (two) times daily. As needed. 07/26/19  Yes SagardiaInes Bloomer, MD  aspirin 81 MG tablet Take 81 mg by mouth daily.    [provider]  calcium-vitamin D (OSCAL WITH D) 500-200 MG-UNIT per tablet Take 1 tablet by mouth.    [provider]  rosuvastatin (CRESTOR) 10 MG tablet Take 1 tablet (10 mg total) by mouth daily. Patient not taking: Reported on 11/07/2019 05/09/19   Horald Pollen, MD  Vitamin D, Ergocalciferol, (DRISDOL) 1.25 MG (50000 UT) CAPS capsule Take 1 capsule (50,000 Units total) by mouth every 7 (seven) days. Patient not taking: Reported on 11/07/2019 05/09/19   Horald Pollen, MD    Allergies  Allergen  Reactions  . Almotriptan Malate     Chest pain  . Eggs Or Egg-Derived Products     Stomach pain  . Imitrex [Sumatriptan Base]   . Penicillins   . Relpax [Eletriptan Hydrobromide] Hypertension  . Sulfa Drugs Cross Reactors   . Vantin     dizziness  . Azithromycin Anxiety    Patient Active Problem List   Diagnosis Date Noted  . Chronic pain syndrome 08/05/2017  . Vitamin D deficiency 08/05/2017  . Generalized anxiety disorder 09/09/2014  . Hypothyroid 11/24/2011  . Migraine 11/24/2011  . Environmental allergies 11/24/2011  . Fibromyalgia 11/24/2011  . Irritable bowel syndrome (IBS) 11/24/2011  . Osteoporosis 11/24/2011    Past Medical History:  Diagnosis Date  . Allergy   . Arthritis   . Asthma   . Fibromyalgia   . Fibromyalgia muscle pain   . Neuromuscular disorder ([place])   . Osteoporosis   . Thyroid disease     History reviewed. No pertinent surgical history.  Social History   Socioeconomic History  . Marital status: Unknown    Spouse name: Not on file  . Number of children: Not on file  . Years of education: Not on file  . Highest education level: Not on file  Occupational History  . Not on file  Tobacco Use  . Smoking status: Never Smoker  . Smokeless  tobacco: Never Used  Substance and Sexual Activity  . Alcohol use: Not on file  . Drug use: Not on file  . Sexual activity: Not on file  Other Topics Concern  . Not on file  Social History Narrative  . Not on file   Social Determinants of Health   Financial Resource Strain:   . Difficulty of Paying Living Expenses: Not on file  Food Insecurity:   . Worried About Programme researcher, broadcasting/film/video in the Last Year: Not on file  . Ran Out of Food in the Last Year: Not on file  Transportation Needs:   . Lack of Transportation (Medical): Not on file  . Lack of Transportation (Non-Medical): Not on file  Physical Activity:   . Days of Exercise per Week: Not on file  . Minutes of Exercise per Session: Not on file    Stress:   . Feeling of Stress : Not on file  Social Connections:   . Frequency of Communication with Friends and Family: Not on file  . Frequency of Social Gatherings with Friends and Family: Not on file  . Attends Religious Services: Not on file  . Active Member of Clubs or Organizations: Not on file  . Attends Banker Meetings: Not on file  . Marital Status: Not on file  Intimate Partner Violence:   . Fear of Current or Ex-Partner: Not on file  . Emotionally Abused: Not on file  . Physically Abused: Not on file  . Sexually Abused: Not on file    Family History  Problem Relation Age of Onset  . Heart disease Mother   . Hypertension Mother   . Hyperlipidemia Mother   . Hearing loss Mother   . Kidney disease Mother   . Vision loss Mother   . Lung disease Mother   . Stroke Father   . Arthritis Sister   . Meniere's disease Brother   . Cancer Maternal Grandmother   . Stroke Maternal Grandmother   . Heart disease Maternal Grandfather   . Heart disease Maternal Uncle      Review of Systems  Constitutional: Negative.  Negative for chills and fever.  HENT: Negative.  Negative for congestion and sore throat.   Respiratory: Positive for cough (Chronic mostly in the morning). Negative for shortness of breath.   Cardiovascular: Negative.  Negative for chest pain and palpitations.  Gastrointestinal: Negative.  Negative for abdominal pain, blood in stool, diarrhea, melena, nausea and vomiting.  Genitourinary: Negative.  Negative for dysuria and hematuria.  Musculoskeletal: Negative.   Skin: Negative.  Negative for rash.  Neurological: Negative for dizziness and headaches.  All other systems reviewed and are negative.    Today's Vitals   11/07/19 1355  BP: 118/76  Pulse: 81  Resp: 16  Temp: 98.6 F (37 C)  TempSrc: Temporal  SpO2: 95%  Weight: 185 lb (83.9 kg)  Height: 5\' 5"  (1.651 m)   Body mass index is 30.79 kg/m.   Physical Exam Vitals reviewed.   Constitutional:      Appearance: Normal appearance.  HENT:     Head: Normocephalic.  Eyes:     Extraocular Movements: Extraocular movements intact.     Conjunctiva/sclera: Conjunctivae normal.     Pupils: Pupils are equal, round, and reactive to light.  Cardiovascular:     Rate and Rhythm: Normal rate and regular rhythm.     Pulses: Normal pulses.     Heart sounds: Normal heart sounds.  Pulmonary:  Effort: Pulmonary effort is normal.     Breath sounds: Normal breath sounds.  Abdominal:     Palpations: Abdomen is soft.     Tenderness: There is no abdominal tenderness.  Musculoskeletal:        General: Normal range of motion.     Cervical back: Normal range of motion and neck supple.  Skin:    General: Skin is warm and dry.     Capillary Refill: Capillary refill takes less than 2 seconds.  Neurological:     General: No focal deficit present.     Mental Status: She is alert and oriented to person, place, and time.  Psychiatric:        Mood and Affect: Mood normal.        Behavior: Behavior normal.    A total of 30 minutes was spent with the patient, greater than 50% of which was in counseling/coordination of care regarding chronic medical problems, review of most recent blood work, review of most recent office visits, diet and nutrition, review of medications and side effects, review of health maintenance, mammogram in particular, prognosis and need for follow-up.   ASSESSMENT & PLAN: Jacqueline Wyatt was seen today for fatigue and cough.  Diagnoses and all orders for this visit:  Dyslipidemia -     Lipid panel  Vitamin D deficiency -     Vitamin D 25 (osteoporosis screening)  Stage 3a chronic kidney disease -     Comprehensive metabolic panel  Fibromyalgia  Hypothyroidism, unspecified type  Chronic pain syndrome     Patient Instructions       If you have lab work done today you will be contacted with your lab results within the next 2 weeks.  If you have not  heard from us then please contact us. The fastest way to get your results is to register for My Chart.   IF you received an x-ray today, you will receive an invoice from [facility]. Please contact [facility] at [phone] with questions or concerns regarding your invoice.   IF you received labwork today, you will receive an invoice from [facility]. Please contact [facility] at [phone] with questions or concerns regarding your invoice.   Our billing staff will not be able to assist you with questions regarding bills from these companies.  You will be contacted with the lab results as soon as they are available. The fastest way to get your results is to activate your My Chart account. Instructions are located on the last page of this paperwork. If you have not heard from us regarding the results in 2 weeks, please contact this office.     Health Maintenance After Age 74 After age 74, you are at a higher risk for certain long-term diseases and infections as well as injuries from falls. Falls are a major cause of broken bones and head injuries in people who are older than age 74. Getting regular preventive care can help to keep you healthy and well. Preventive care includes getting regular testing and making lifestyle changes as recommended by your health care provider. Talk with your health care provider about:  Which screenings and tests you should have. A screening is a test that checks for a disease when you have no symptoms.  A diet and exercise plan that is right for you. What should I know about screenings and tests to prevent falls? Screening and testing are the best ways to find a health problem early. Early diagnosis and treatment give  you the best chance of managing medical conditions that are common after age 61. Certain conditions and lifestyle choices may make you more likely to have a fall. Your health care provider may recommend:  Regular vision checks. Poor  vision and conditions such as cataracts can make you more likely to have a fall. If you wear glasses, make sure to get your prescription updated if your vision changes.  Medicine review. Work with your health care provider to regularly review all of the medicines you are taking, including over-the-counter medicines. Ask your health care provider about any side effects that may make you more likely to have a fall. Tell your health care provider if any medicines that you take make you feel dizzy or sleepy.  Osteoporosis screening. Osteoporosis is a condition that causes the bones to get weaker. This can make the bones weak and cause them to break more easily.  Blood pressure screening. Blood pressure changes and medicines to control blood pressure can make you feel dizzy.  Strength and balance checks. Your health care provider may recommend certain tests to check your strength and balance while standing, walking, or changing positions.  Foot health exam. Foot pain and numbness, as well as not wearing proper footwear, can make you more likely to have a fall.  Depression screening. You may be more likely to have a fall if you have a fear of falling, feel emotionally [place], or feel unable to do activities that you used to do.  Alcohol use screening. Using too much alcohol can affect your balance and may make you more likely to have a fall. What actions can I take to lower my risk of falls? General instructions  Talk with your health care provider about your risks for falling. Tell your health care provider if: ? You fall. Be sure to tell your health care provider about all falls, even ones that seem minor. ? You feel dizzy, sleepy, or off-balance.  Take over-the-counter and prescription medicines only as told by your health care provider. These include any supplements.  Eat a healthy diet and maintain a healthy weight. A healthy diet includes [place]-fat dairy products, [place]-fat (lean) meats, and fiber  from whole grains, beans, and lots of fruits and vegetables. Home safety  Remove any tripping hazards, such as rugs, cords, and clutter.  Install safety equipment such as grab bars in bathrooms and safety rails on stairs.  Keep rooms and walkways well-lit. Activity   Follow a regular exercise program to stay fit. This will help you maintain your balance. Ask your health care provider what types of exercise are appropriate for you.  If you need a cane or walker, use it as recommended by your health care provider.  Wear supportive shoes that have nonskid soles. Lifestyle  Do not drink alcohol if your health care provider tells you not to drink.  If you drink alcohol, limit how much you have: ? 0-1 drink a day for women. ? 0-2 drinks a day for men.  Be aware of how much alcohol is in your drink. In the [place] one drink equals one typical bottle of beer (12 oz), one-half glass of wine (5 oz), or one shot of hard liquor (1 oz).  Do not use any products that contain nicotine or tobacco, such as cigarettes and e-cigarettes. If you need help quitting, ask your health care provider. Summary  Having a healthy lifestyle and getting preventive care can help to protect your health and wellness after age 8.  Screening and testing are the best way to find a health problem early and help you avoid having a fall. Early diagnosis and treatment give you the best chance for managing medical conditions that are more common for people who are older than age 72.  Falls are a major cause of broken bones and head injuries in people who are older than age 63. Take precautions to prevent a fall at home.  Work with your health care provider to learn what changes you can make to improve your health and wellness and to prevent falls. This information is not intended to replace advice given to you by your health care provider. Make sure you discuss any questions you have with your health care  provider. Document Revised: 02/01/2019 Document Reviewed: 08/24/2017 Elsevier Patient Education  2020 Elsevier Inc.     Edwina Barth, MD [facility]

## 2019-11-07 NOTE — Patient Instructions (Addendum)
   If you have lab work done today you will be contacted with your lab results within the next 2 weeks.  If you have not heard from us then please contact us. The fastest way to get your results is to register for My Chart.   IF you received an x-ray today, you will receive an invoice from [facility]. Please contact [facility] at [phone] with questions or concerns regarding your invoice.   IF you received labwork today, you will receive an invoice from [facility]. Please contact [facility] at [phone] with questions or concerns regarding your invoice.   Our billing staff will not be able to assist you with questions regarding bills from these companies.  You will be contacted with the lab results as soon as they are available. The fastest way to get your results is to activate your My Chart account. Instructions are located on the last page of this paperwork. If you have not heard from us regarding the results in 2 weeks, please contact this office.     Health Maintenance After Age 65 After age 74, you are at a higher risk for certain long-term diseases and infections as well as injuries from falls. Falls are a major cause of broken bones and head injuries in people who are older than age 74. Getting regular preventive care can help to keep you healthy and well. Preventive care includes getting regular testing and making lifestyle changes as recommended by your health care provider. Talk with your health care provider about:  Which screenings and tests you should have. A screening is a test that checks for a disease when you have no symptoms.  A diet and exercise plan that is right for you. What should I know about screenings and tests to prevent falls? Screening and testing are the best ways to find a health problem early. Early diagnosis and treatment give you the best chance of managing medical conditions that are common after age 74. Certain conditions and  lifestyle choices may make you more likely to have a fall. Your health care provider may recommend:  Regular vision checks. Poor vision and conditions such as cataracts can make you more likely to have a fall. If you wear glasses, make sure to get your prescription updated if your vision changes.  Medicine review. Work with your health care provider to regularly review all of the medicines you are taking, including over-the-counter medicines. Ask your health care provider about any side effects that may make you more likely to have a fall. Tell your health care provider if any medicines that you take make you feel dizzy or sleepy.  Osteoporosis screening. Osteoporosis is a condition that causes the bones to get weaker. This can make the bones weak and cause them to break more easily.  Blood pressure screening. Blood pressure changes and medicines to control blood pressure can make you feel dizzy.  Strength and balance checks. Your health care provider may recommend certain tests to check your strength and balance while standing, walking, or changing positions.  Foot health exam. Foot pain and numbness, as well as not wearing proper footwear, can make you more likely to have a fall.  Depression screening. You may be more likely to have a fall if you have a fear of falling, feel emotionally [place], or feel unable to do activities that you used to do.  Alcohol use screening. Using too much alcohol can affect your balance and may make you more likely to   have a fall. What actions can I take to lower my risk of falls? General instructions  Talk with your health care provider about your risks for falling. Tell your health care provider if: ? You fall. Be sure to tell your health care provider about all falls, even ones that seem minor. ? You feel dizzy, sleepy, or off-balance.  Take over-the-counter and prescription medicines only as told by your health care provider. These include any  supplements.  Eat a healthy diet and maintain a healthy weight. A healthy diet includes [place]-fat dairy products, [place]-fat (lean) meats, and fiber from whole grains, beans, and lots of fruits and vegetables. Home safety  Remove any tripping hazards, such as rugs, cords, and clutter.  Install safety equipment such as grab bars in bathrooms and safety rails on stairs.  Keep rooms and walkways well-lit. Activity   Follow a regular exercise program to stay fit. This will help you maintain your balance. Ask your health care provider what types of exercise are appropriate for you.  If you need a cane or walker, use it as recommended by your health care provider.  Wear supportive shoes that have nonskid soles. Lifestyle  Do not drink alcohol if your health care provider tells you not to drink.  If you drink alcohol, limit how much you have: ? 0-1 drink a day for women. ? 0-2 drinks a day for men.  Be aware of how much alcohol is in your drink. In the [place] one drink equals one typical bottle of beer (12 oz), one-half glass of wine (5 oz), or one shot of hard liquor (1 oz).  Do not use any products that contain nicotine or tobacco, such as cigarettes and e-cigarettes. If you need help quitting, ask your health care provider. Summary  Having a healthy lifestyle and getting preventive care can help to protect your health and wellness after age 74.  Screening and testing are the best way to find a health problem early and help you avoid having a fall. Early diagnosis and treatment give you the best chance for managing medical conditions that are more common for people who are older than age 74.  Falls are a major cause of broken bones and head injuries in people who are older than age 74. Take precautions to prevent a fall at home.  Work with your health care provider to learn what changes you can make to improve your health and wellness and to prevent falls. This information is not intended  to replace advice given to you by your health care provider. Make sure you discuss any questions you have with your health care provider. Document Revised: 02/01/2019 Document Reviewed: 08/24/2017 Elsevier Patient Education  2020 Elsevier Inc.  

## 2019-11-08 ENCOUNTER — Other Ambulatory Visit: Payer: Self-pay | Admitting: Emergency Medicine

## 2019-11-08 DIAGNOSIS — E785 Hyperlipidemia, unspecified: Secondary | ICD-10-CM

## 2019-11-08 DIAGNOSIS — E559 Vitamin D deficiency, unspecified: Secondary | ICD-10-CM

## 2019-11-08 LAB — LIPID PANEL
Chol/HDL Ratio: 3.3 ratio (ref 0.0–4.4)
Cholesterol, Total: 221 mg/dL — ABNORMAL HIGH (ref 100–199)
HDL: 68 mg/dL (ref >39)
LDL Chol Calc (NIH): 133 mg/dL — ABNORMAL HIGH (ref 0–99)
Triglycerides: 116 mg/dL (ref 0–149)
VLDL Cholesterol Cal: 20 mg/dL (ref 5–40)

## 2019-11-08 LAB — COMPREHENSIVE METABOLIC PANEL
ALT: 18 IU/L (ref 0–32)
AST: 18 IU/L (ref 0–40)
Albumin/Globulin Ratio: 1.7 (ref 1.2–2.2)
Albumin: 4.2 g/dL (ref 3.7–4.7)
Alkaline Phosphatase: 91 IU/L (ref 39–117)
BUN/Creatinine Ratio: 20 (ref 12–28)
BUN: 18 mg/dL (ref 8–27)
Bilirubin Total: 0.4 mg/dL (ref 0.0–1.2)
CO2: 21 mmol/L (ref 20–29)
Calcium: 9.4 mg/dL (ref 8.7–10.3)
Chloride: 106 mmol/L (ref 96–106)
Creatinine, Ser: 0.9 mg/dL (ref 0.57–1.00)
GFR calc Af Amer: 73 mL/min/{1.73_m2} (ref >59)
GFR calc non Af Amer: 64 mL/min/{1.73_m2} (ref >59)
Globulin, Total: 2.5 g/dL (ref 1.5–4.5)
Glucose: 106 mg/dL — ABNORMAL HIGH (ref 65–99)
Potassium: 4.6 mmol/L (ref 3.5–5.2)
Sodium: 140 mmol/L (ref 134–144)
Total Protein: 6.7 g/dL (ref 6.0–8.5)

## 2019-11-08 LAB — VITAMIN D 25 HYDROXY (VIT D DEFICIENCY, FRACTURES): Vit D, 25-Hydroxy: 23.3 ng/mL — ABNORMAL LOW (ref 30.0–100.0)

## 2019-11-08 MED ORDER — ROSUVASTATIN CALCIUM 10 MG PO TABS: 10.0000 mg | ORAL_TABLET | Freq: Every day | ORAL | 3 refills | Status: DC

## 2019-11-08 MED ORDER — VITAMIN D (ERGOCALCIFEROL) 1.25 MG (50000 UNIT) PO CAPS: 50000.0000 [IU] | ORAL_CAPSULE | ORAL | 12 refills | Status: DC

## 2020-01-03 ENCOUNTER — Telehealth: Payer: Self-pay | Admitting: Emergency Medicine

## 2020-01-03 NOTE — Telephone Encounter (Signed)
Pt is calling  to let Provider know that she needs bone scan and mammogram at the same time . Before she gets vaccine . Will not need a referral for mammogram but does  need referral sent to [facility] for bone density. Have [facility] on [address] call/ patient states that this has been discussed on a previous visit .

## 2020-01-21 LAB — HM MAMMOGRAPHY

## 2020-01-21 LAB — HM DEXA SCAN

## 2020-01-23 ENCOUNTER — Telehealth: Payer: Self-pay | Admitting: *Deleted

## 2020-01-23 NOTE — Telephone Encounter (Signed)
On 01/22/2020, faxed signed order for Bone Density, reason Osteopenia. Confirmation page 10:25 am.

## 2020-02-04 ENCOUNTER — Telehealth: Payer: Self-pay | Admitting: *Deleted

## 2020-02-04 ENCOUNTER — Telehealth: Payer: Self-pay | Admitting: Family Medicine

## 2020-02-04 NOTE — Telephone Encounter (Signed)
Faxed requisition for Cologuard kit to [facility]. Confirmation page 4:31 pm.

## 2020-02-04 NOTE — Telephone Encounter (Signed)
Please let the patient know that her bone density improved from osteoporosis to osteopenia.  This means the bone got more density and is less fragile.  Bone density sent for scan.

## 2020-02-04 NOTE — Telephone Encounter (Signed)
Spoke to patient with Bone Density results, per Dr Nolon Rod. Also, the patient wants a Cologuard kit ordered.

## 2020-02-04 NOTE — Telephone Encounter (Signed)
Spoke to patient with Bone Density results. Per patient she wants another Cologuard kit sent because the other one has expired. I spoke to [place] in lab and reorder the kit.

## 2020-02-04 NOTE — Addendum Note (Signed)
Addended by: Argentina Ponder on: 02/04/2020 04:29 PM   Modules accepted: Orders

## 2020-02-11 ENCOUNTER — Encounter: Payer: Self-pay | Admitting: *Deleted

## 2020-03-06 ENCOUNTER — Other Ambulatory Visit: Payer: Self-pay | Admitting: *Deleted

## 2020-03-06 DIAGNOSIS — E039 Hypothyroidism, unspecified: Secondary | ICD-10-CM

## 2020-03-06 MED ORDER — LEVOTHYROXINE SODIUM 50 MCG PO TABS: ORAL_TABLET | ORAL | 0 refills | Status: DC

## 2020-03-26 ENCOUNTER — Other Ambulatory Visit: Payer: Self-pay | Admitting: Emergency Medicine

## 2020-03-26 DIAGNOSIS — M797 Fibromyalgia: Secondary | ICD-10-CM

## 2020-03-26 DIAGNOSIS — G894 Chronic pain syndrome: Secondary | ICD-10-CM

## 2020-03-26 MED ORDER — TRAMADOL HCL 50 MG PO TABS: 50.0000 mg | ORAL_TABLET | Freq: Two times a day (BID) | ORAL | 3 refills | Status: DC

## 2020-03-26 NOTE — Telephone Encounter (Signed)
Patient is requesting a refill of the following medications: Requested Prescriptions   Pending Prescriptions Disp Refills  . traMADol (ULTRAM) 50 MG tablet 60 tablet 3    Sig: Take 1 tablet (50 mg total) by mouth 2 (two) times daily. As needed.    Date of patient request: 03/26/2020 Last office visit: 11/07/2019 Date of last refill: 10/1/202 Last refill amount: 60 x3 refills Follow up time period per chart: N/A

## 2020-03-26 NOTE — Telephone Encounter (Signed)
Pt needs Rx refill pt is out good until late Thursday wanting courtesy refill until July appt   What is the name of the medication? traMADol (ULTRAM) 50 MG tablet [[id number]]    Have you contacted your pharmacy to request a refill? *n   Which pharmacy would you like this sent to? [id number] [place], [place] - [phone] AT [facility] [address] [facility] [address]  [address] [place] [address]  Phone:  [phone] Fax:  [phone]  DEA #:  [id number]   Patient notified that their request is being sent to the clinical staff for review and that they should receive a call once it is complete. If they do not receive a call within 72 hours they can check with their pharmacy or our office.

## 2020-04-02 ENCOUNTER — Telehealth: Payer: Self-pay | Admitting: *Deleted

## 2020-04-02 NOTE — Telephone Encounter (Signed)
Schedule AWV.  

## 2020-04-10 ENCOUNTER — Telehealth: Payer: Self-pay | Admitting: *Deleted

## 2020-04-15 ENCOUNTER — Ambulatory Visit: Payer: Self-pay

## 2020-04-15 NOTE — Patient Instructions (Addendum)
Thank you for taking time to come for your Medicare Wellness Visit. I appreciate your ongoing commitment to your health goals. Please review the following plan we discussed and let me know if I can assist you in the future.  Jleigh Striplin LPN  Preventive Care 65 Years and Older, Female Preventive care refers to lifestyle choices and visits with your health care provider that can promote health and wellness. This includes:  A yearly physical exam. This is also called an annual well check.  Regular dental and eye exams.  Immunizations.  Screening for certain conditions.  Healthy lifestyle choices, such as diet and exercise. What can I expect for my preventive care visit? Physical exam Your health care provider will check:  Height and weight. These may be used to calculate body mass index (BMI), which is a measurement that tells if you are at a healthy weight.  Heart rate and blood pressure.  Your skin for abnormal spots. Counseling Your health care provider may ask you questions about:  Alcohol, tobacco, and drug use.  Emotional well-being.  Home and relationship well-being.  Sexual activity.  Eating habits.  History of falls.  Memory and ability to understand (cognition).  Work and work environment.  Pregnancy and menstrual history. What immunizations do I need?  Influenza (flu) vaccine  This is recommended every year. Tetanus, diphtheria, and pertussis (Tdap) vaccine  You may need a Td booster every 10 years. Varicella (chickenpox) vaccine  You may need this vaccine if you have not already been vaccinated. Zoster (shingles) vaccine  You may need this after age 60. Pneumococcal conjugate (PCV13) vaccine  One dose is recommended after age 65. Pneumococcal polysaccharide (PPSV23) vaccine  One dose is recommended after age 65. Measles, mumps, and rubella (MMR) vaccine  You may need at least one dose of MMR if you were born in 1957 or later. You may also  need a second dose. Meningococcal conjugate (MenACWY) vaccine  You may need this if you have certain conditions. Hepatitis A vaccine  You may need this if you have certain conditions or if you travel or work in places where you may be exposed to hepatitis A. Hepatitis B vaccine  You may need this if you have certain conditions or if you travel or work in places where you may be exposed to hepatitis B. Haemophilus influenzae type b (Hib) vaccine  You may need this if you have certain conditions. You may receive vaccines as individual doses or as more than one vaccine together in one shot (combination vaccines). Talk with your health care provider about the risks and benefits of combination vaccines. What tests do I need? Blood tests  Lipid and cholesterol levels. These may be checked every 5 years, or more frequently depending on your overall health.  Hepatitis C test.  Hepatitis B test. Screening  Lung cancer screening. You may have this screening every year starting at age 55 if you have a 30-pack-year history of smoking and currently smoke or have quit within the past 15 years.  Colorectal cancer screening. All adults should have this screening starting at age 50 and continuing until age 75. Your health care provider may recommend screening at age 45 if you are at increased risk. You will have tests every 1-10 years, depending on your results and the type of screening test.  Diabetes screening. This is done by checking your blood sugar (glucose) after you have not eaten for a while (fasting). You may have this done every 1-3   years.  Mammogram. This may be done every 1-2 years. Talk with your health care provider about how often you should have regular mammograms.  BRCA-related cancer screening. This may be done if you have a family history of breast, ovarian, tubal, or peritoneal cancers. Other tests  Sexually transmitted disease (STD) testing.  Bone density scan. This is done  to screen for osteoporosis. You may have this done starting at age 94. Follow these instructions at home: Eating and drinking  Eat a diet that includes fresh fruits and vegetables, whole grains, lean protein, and [place]-fat dairy products. Limit your intake of foods with high amounts of sugar, saturated fats, and salt.  Take vitamin and mineral supplements as recommended by your health care provider.  Do not drink alcohol if your health care provider tells you not to drink.  If you drink alcohol: ? Limit how much you have to 0-1 drink a day. ? Be aware of how much alcohol is in your drink. In the [place] one drink equals one 12 oz bottle of beer (355 mL), one 5 oz glass of wine (148 mL), or one 1 oz glass of hard liquor (44 mL). Lifestyle  Take daily care of your teeth and gums.  Stay active. Exercise for at least 30 minutes on 5 or more days each week.  Do not use any products that contain nicotine or tobacco, such as cigarettes, e-cigarettes, and chewing tobacco. If you need help quitting, ask your health care provider.  If you are sexually active, practice safe sex. Use a condom or other form of protection in order to prevent STIs (sexually transmitted infections).  Talk with your health care provider about taking a Nov[place]ki-dose aspirin or statin. What's next?  Go to your health care provider once a year for a well check visit.  Ask your health care provider how often you should have your eyes and teeth checked.  Stay up to date on all vaccines. This information is not intended to replace advice given to you by your health care provider. Make sure you discuss any questions you have with your health care provider. Document Revised: 10/05/2018 Document Reviewed: 10/05/2018 Elsevier Patient Education  2020 [employer].

## 2020-05-01 ENCOUNTER — Other Ambulatory Visit: Payer: Self-pay

## 2020-05-01 ENCOUNTER — Ambulatory Visit (INDEPENDENT_AMBULATORY_CARE_PROVIDER_SITE_OTHER): Payer: Medicare Other | Admitting: Emergency Medicine

## 2020-05-01 ENCOUNTER — Encounter: Payer: Self-pay | Admitting: Emergency Medicine

## 2020-05-01 VITALS — BP 119/81 | HR 85 | Temp 97.8°F | Resp 16 | Ht 65.5 in | Wt 189.0 lb

## 2020-05-01 DIAGNOSIS — N1831 Chronic kidney disease, stage 3a: Secondary | ICD-10-CM

## 2020-05-01 DIAGNOSIS — G894 Chronic pain syndrome: Secondary | ICD-10-CM

## 2020-05-01 DIAGNOSIS — E785 Hyperlipidemia, unspecified: Secondary | ICD-10-CM | POA: Diagnosis not present

## 2020-05-01 DIAGNOSIS — Z23 Encounter for immunization: Secondary | ICD-10-CM | POA: Diagnosis not present

## 2020-05-01 DIAGNOSIS — M797 Fibromyalgia: Secondary | ICD-10-CM | POA: Diagnosis not present

## 2020-05-01 DIAGNOSIS — E039 Hypothyroidism, unspecified: Secondary | ICD-10-CM

## 2020-05-01 DIAGNOSIS — E559 Vitamin D deficiency, unspecified: Secondary | ICD-10-CM

## 2020-05-01 NOTE — Patient Instructions (Addendum)
   If you have lab work done today you will be contacted with your lab results within the next 2 weeks.  If you have not heard from us then please contact us. The fastest way to get your results is to register for My Chart.   IF you received an x-ray today, you will receive an invoice from [facility]. Please contact [facility] at [phone] with questions or concerns regarding your invoice.   IF you received labwork today, you will receive an invoice from [facility]. Please contact [facility] at [phone] with questions or concerns regarding your invoice.   Our billing staff will not be able to assist you with questions regarding bills from these companies.  You will be contacted with the lab results as soon as they are available. The fastest way to get your results is to activate your My Chart account. Instructions are located on the last page of this paperwork. If you have not heard from us regarding the results in 2 weeks, please contact this office.     Health Maintenance After Age 74 After age 65, you are at a higher risk for certain long-term diseases and infections as well as injuries from falls. Falls are a major cause of broken bones and head injuries in people who are older than age 74. Getting regular preventive care can help to keep you healthy and well. Preventive care includes getting regular testing and making lifestyle changes as recommended by your health care provider. Talk with your health care provider about:  Which screenings and tests you should have. A screening is a test that checks for a disease when you have no symptoms.  A diet and exercise plan that is right for you. What should I know about screenings and tests to prevent falls? Screening and testing are the best ways to find a health problem early. Early diagnosis and treatment give you the best chance of managing medical conditions that are common after age 74. Certain conditions and  lifestyle choices may make you more likely to have a fall. Your health care provider may recommend:  Regular vision checks. Poor vision and conditions such as cataracts can make you more likely to have a fall. If you wear glasses, make sure to get your prescription updated if your vision changes.  Medicine review. Work with your health care provider to regularly review all of the medicines you are taking, including over-the-counter medicines. Ask your health care provider about any side effects that may make you more likely to have a fall. Tell your health care provider if any medicines that you take make you feel dizzy or sleepy.  Osteoporosis screening. Osteoporosis is a condition that causes the bones to get weaker. This can make the bones weak and cause them to break more easily.  Blood pressure screening. Blood pressure changes and medicines to control blood pressure can make you feel dizzy.  Strength and balance checks. Your health care provider may recommend certain tests to check your strength and balance while standing, walking, or changing positions.  Foot health exam. Foot pain and numbness, as well as not wearing proper footwear, can make you more likely to have a fall.  Depression screening. You may be more likely to have a fall if you have a fear of falling, feel emotionally [place], or feel unable to do activities that you used to do.  Alcohol use screening. Using too much alcohol can affect your balance and may make you more likely to   have a fall. What actions can I take to lower my risk of falls? General instructions  Talk with your health care provider about your risks for falling. Tell your health care provider if: ? You fall. Be sure to tell your health care provider about all falls, even ones that seem minor. ? You feel dizzy, sleepy, or off-balance.  Take over-the-counter and prescription medicines only as told by your health care provider. These include any  supplements.  Eat a healthy diet and maintain a healthy weight. A healthy diet includes [place]-fat dairy products, [place]-fat (lean) meats, and fiber from whole grains, beans, and lots of fruits and vegetables. Home safety  Remove any tripping hazards, such as rugs, cords, and clutter.  Install safety equipment such as grab bars in bathrooms and safety rails on stairs.  Keep rooms and walkways well-lit. Activity   Follow a regular exercise program to stay fit. This will help you maintain your balance. Ask your health care provider what types of exercise are appropriate for you.  If you need a cane or walker, use it as recommended by your health care provider.  Wear supportive shoes that have nonskid soles. Lifestyle  Do not drink alcohol if your health care provider tells you not to drink.  If you drink alcohol, limit how much you have: ? 0-1 drink a day for women. ? 0-2 drinks a day for men.  Be aware of how much alcohol is in your drink. In the [place] one drink equals one typical bottle of beer (12 oz), one-half glass of wine (5 oz), or one shot of hard liquor (1 oz).  Do not use any products that contain nicotine or tobacco, such as cigarettes and e-cigarettes. If you need help quitting, ask your health care provider. Summary  Having a healthy lifestyle and getting preventive care can help to protect your health and wellness after age 74.  Screening and testing are the best way to find a health problem early and help you avoid having a fall. Early diagnosis and treatment give you the best chance for managing medical conditions that are more common for people who are older than age 74.  Falls are a major cause of broken bones and head injuries in people who are older than age 74. Take precautions to prevent a fall at home.  Work with your health care provider to learn what changes you can make to improve your health and wellness and to prevent falls. This information is not intended  to replace advice given to you by your health care provider. Make sure you discuss any questions you have with your health care provider. Document Revised: 02/01/2019 Document Reviewed: 08/24/2017 Elsevier Patient Education  2020 [employer]  

## 2020-05-01 NOTE — Progress Notes (Signed)
Jacqueline Wyatt 74 y.o.   Chief Complaint  Patient presents with  . Hypothyroidism    follow up    HISTORY OF PRESENT ILLNESS: This is a 74 y.o. female with history of fibromyalgia, hypothyroidism, dyslipidemia here for follow-up. Takes tramadol sparingly and judiciously as needed for pain. Decided not to take rosuvastatin, not taking baby aspirin.  Takes Synthroid 50 mcg daily. Also taking vitamin D daily. Feels tired all the time. No other complaints or medical concerns. Fully vaccinated against Covid. Lab Results  Component Value Date   TSH 4.100 05/08/2019    HPI   Prior to Admission medications   Medication Sig Start Date End Date Taking? Authorizing Provider  co-enzyme Q-10 30 MG capsule Take 30 mg by mouth 3 (three) times daily.   Yes [provider]  levothyroxine (SYNTHROID) 50 MCG tablet TAKE 1 TABLET BY MOUTH EVERY DAY BEFORE BREAKFAST 03/06/20  Yes Rhonna Holster, Eilleen Kempf, MD  loratadine (CLARITIN) 10 MG tablet Take 10 mg by mouth daily.   Yes [provider]  traMADol (ULTRAM) 50 MG tablet Take 1 tablet (50 mg total) by mouth 2 (two) times daily. As needed. 03/26/20  Yes Haile Toppins, Eilleen Kempf, MD  Vitamin D, Ergocalciferol, (DRISDOL) 1.25 MG (50000 UNIT) CAPS capsule Take 1 capsule (50,000 Units total) by mouth every 7 (seven) days. 11/08/19  Yes SagardiaEilleen Kempf, MD  aspirin 81 MG tablet Take 81 mg by mouth daily. Patient not taking: Reported on 05/01/2020    [provider]  Biotin 1 MG CAPS Take by mouth. Patient not taking: Reported on 05/01/2020    [provider]  calcium-vitamin D (OSCAL WITH D) 500-200 MG-UNIT per tablet Take 1 tablet by mouth. Patient not taking: Reported on 05/01/2020    [provider]  rosuvastatin (CRESTOR) 10 MG tablet Take 1 tablet (10 mg total) by mouth daily. Patient not taking: Reported on 05/01/2020 11/08/19   Georgina Quint, MD    Allergies  Allergen Reactions  . Almotriptan Malate      Chest pain  . Eggs Or Egg-Derived Products     Stomach pain  . Imitrex [Sumatriptan Base]   . Penicillins   . Relpax [Eletriptan Hydrobromide] Hypertension  . Sulfa Drugs Cross Reactors   . Vantin     dizziness  . Azithromycin Anxiety    Patient Active Problem List   Diagnosis Date Noted  . Stage 3a chronic kidney disease 11/07/2019  . Dyslipidemia 11/07/2019  . Chronic pain syndrome 08/05/2017  . Vitamin D deficiency 08/05/2017  . Generalized anxiety disorder 09/09/2014  . Hypothyroidism 11/24/2011  . Migraine 11/24/2011  . Environmental allergies 11/24/2011  . Fibromyalgia 11/24/2011  . Irritable bowel syndrome (IBS) 11/24/2011  . Osteoporosis 11/24/2011    Past Medical History:  Diagnosis Date  . Allergy   . Arthritis   . Asthma   . Fibromyalgia   . Fibromyalgia muscle pain   . Neuromuscular disorder (HCC)   . Osteoporosis   . Thyroid disease     History reviewed. No pertinent surgical history.  Social History   Socioeconomic History  . Marital status: Unknown    Spouse name: Not on file  . Number of children: Not on file  . Years of education: Not on file  . Highest education level: Not on file  Occupational History  . Not on file  Tobacco Use  . Smoking status: Never Smoker  . Smokeless tobacco: Never Used  Substance and Sexual Activity  . Alcohol use:  Not on file  . Drug use: Not on file  . Sexual activity: Not on file  Other Topics Concern  . Not on file  Social History Narrative  . Not on file   Social Determinants of Health   Financial Resource Strain:   . Difficulty of Paying Living Expenses:   Food Insecurity:   . Worried About Programme researcher, broadcasting/film/video in the Last Year:   . Barista in the Last Year:   Transportation Needs:   . Freight forwarder (Medical):   Marland Kitchen Lack of Transportation (Non-Medical):   Physical Activity:   . Days of Exercise per Week:   . Minutes of Exercise per Session:   Stress:   . Feeling of Stress :     Social Connections:   . Frequency of Communication with Friends and Family:   . Frequency of Social Gatherings with Friends and Family:   . Attends Religious Services:   . Active Member of Clubs or Organizations:   . Attends Banker Meetings:   Marland Kitchen Marital Status:   Intimate Partner Violence:   . Fear of Current or Ex-Partner:   . Emotionally Abused:   Marland Kitchen Physically Abused:   . Sexually Abused:     Family History  Problem Relation Age of Onset  . Heart disease Mother   . Hypertension Mother   . Hyperlipidemia Mother   . Hearing loss Mother   . Kidney disease Mother   . Vision loss Mother   . Lung disease Mother   . Stroke Father   . Arthritis Sister   . Meniere's disease Brother   . Cancer Maternal Grandmother   . Stroke Maternal Grandmother   . Heart disease Maternal Grandfather   . Heart disease Maternal Uncle      Review of Systems  Constitutional: Positive for malaise/fatigue. Negative for chills and fever.  HENT: Negative.  Negative for congestion and sore throat.   Respiratory: Negative.  Negative for cough and shortness of breath.   Cardiovascular: Negative.  Negative for chest pain and palpitations.  Gastrointestinal: Negative.  Negative for abdominal pain, diarrhea, nausea and vomiting.  Genitourinary: Negative.  Negative for dysuria and hematuria.  Musculoskeletal: Negative.  Negative for back pain, myalgias and neck pain.  Skin: Negative.  Negative for rash.  Neurological: Positive for weakness. Negative for dizziness and headaches.  Endo/Heme/Allergies: Negative.   All other systems reviewed and are negative.  Today's Vitals   05/01/20 1328  BP: 119/81  Pulse: 85  Resp: 16  Temp: 97.8 F (36.6 C)  TempSrc: Temporal  SpO2: 96%  Weight: 189 lb (85.7 kg)  Height: 5' 5.5" (1.664 m)   Body mass index is 30.97 kg/m.   Physical Exam Vitals reviewed.  Constitutional:      Appearance: Normal appearance.  HENT:     Head: Normocephalic.      Mouth/Throat:     Mouth: Mucous membranes are moist.     Pharynx: Oropharynx is clear.  Eyes:     Extraocular Movements: Extraocular movements intact.     Conjunctiva/sclera: Conjunctivae normal.     Pupils: Pupils are equal, round, and reactive to light.  Cardiovascular:     Rate and Rhythm: Normal rate and regular rhythm.     Pulses: Normal pulses.     Heart sounds: Normal heart sounds.  Pulmonary:     Effort: Pulmonary effort is normal.     Breath sounds: Normal breath sounds.  Musculoskeletal:  General: Normal range of motion.     Cervical back: Normal range of motion and neck supple. No tenderness.     Right lower leg: No edema.     Left lower leg: No edema.  Skin:    General: Skin is warm and dry.     Capillary Refill: Capillary refill takes less than 2 seconds.  Neurological:     General: No focal deficit present.     Mental Status: She is alert and oriented to person, place, and time.  Psychiatric:        Mood and Affect: Mood normal.        Behavior: Behavior normal.    A total of 30 minutes was spent with the patient, greater than 50% of which was in counseling/coordination of care regarding multiple chronic medical problems, review of most recent office visit notes, review of most recent blood work results, review of all medications, diet and nutrition, health maintenance items, prognosis and need for follow-up.   ASSESSMENT & PLAN: Clinically stable.  No medical concerns identified at this visit.  Continue present medications.  No changes.  [name] was seen today for hypothyroidism.  Diagnoses and all orders for this visit:  Hypothyroidism, unspecified type -     TSH  Dyslipidemia -     Lipid panel  Fibromyalgia  Chronic pain syndrome  Stage 3a chronic kidney disease -     Comprehensive metabolic panel  Vitamin D deficiency -     VITAMIN D 25 Hydroxy (Vit-D Deficiency, Fractures)  Need for prophylactic vaccination against Streptococcus  pneumoniae (pneumococcus) -     Pneumococcal polysaccharide vaccine 23-valent greater than or equal to 2yo subcutaneous/IM    Patient Instructions       If you have lab work done today you will be contacted with your lab results within the next 2 weeks.  If you have not heard from us then please contact us. The fastest way to get your results is to register for My Chart.   IF you received an x-ray today, you will receive an invoice from [facility]. Please contact [facility] at [phone] with questions or concerns regarding your invoice.   IF you received labwork today, you will receive an invoice from [facility]. Please contact [facility] at [phone] with questions or concerns regarding your invoice.   Our billing staff will not be able to assist you with questions regarding bills from these companies.  You will be contacted with the lab results as soon as they are available. The fastest way to get your results is to activate your My Chart account. Instructions are located on the last page of this paperwork. If you have not heard from us regarding the results in 2 weeks, please contact this office.     Health Maintenance After Age 74 After age 74, you are at a higher risk for certain long-term diseases and infections as well as injuries from falls. Falls are a major cause of broken bones and head injuries in people who are older than age 74. Getting regular preventive care can help to keep you healthy and well. Preventive care includes getting regular testing and making lifestyle changes as recommended by your health care provider. Talk with your health care provider about:  Which screenings and tests you should have. A screening is a test that checks for a disease when you have no symptoms.  A diet and exercise plan that is right for you. What should I know about screenings and  tests to prevent falls? Screening and testing are the best ways to find a health  problem early. Early diagnosis and treatment give you the best chance of managing medical conditions that are common after age 68. Certain conditions and lifestyle choices may make you more likely to have a fall. Your health care provider may recommend:  Regular vision checks. Poor vision and conditions such as cataracts can make you more likely to have a fall. If you wear glasses, make sure to get your prescription updated if your vision changes.  Medicine review. Work with your health care provider to regularly review all of the medicines you are taking, including over-the-counter medicines. Ask your health care provider about any side effects that may make you more likely to have a fall. Tell your health care provider if any medicines that you take make you feel dizzy or sleepy.  Osteoporosis screening. Osteoporosis is a condition that causes the bones to get weaker. This can make the bones weak and cause them to break more easily.  Blood pressure screening. Blood pressure changes and medicines to control blood pressure can make you feel dizzy.  Strength and balance checks. Your health care provider may recommend certain tests to check your strength and balance while standing, walking, or changing positions.  Foot health exam. Foot pain and numbness, as well as not wearing proper footwear, can make you more likely to have a fall.  Depression screening. You may be more likely to have a fall if you have a fear of falling, feel emotionally [place], or feel unable to do activities that you used to do.  Alcohol use screening. Using too much alcohol can affect your balance and may make you more likely to have a fall. What actions can I take to lower my risk of falls? General instructions  Talk with your health care provider about your risks for falling. Tell your health care provider if: ? You fall. Be sure to tell your health care provider about all falls, even ones that seem minor. ? You feel dizzy,  sleepy, or off-balance.  Take over-the-counter and prescription medicines only as told by your health care provider. These include any supplements.  Eat a healthy diet and maintain a healthy weight. A healthy diet includes Vi[place]-fat dairy products, Strauss-fat (lean) meats, and fiber from whole grains, beans, and lots of fruits and vegetables. Home safety  Remove any tripping hazards, such as rugs, cords, and clutter.  Install safety equipment such as grab bars in bathrooms and safety rails on stairs.  Keep rooms and walkways well-lit. Activity   Follow a regular exercise program to stay fit. This will help you maintain your balance. Ask your health care provider what types of exercise are appropriate for you.  If you need a cane or walker, use it as recommended by your health care provider.  Wear supportive shoes that have nonskid soles. Lifestyle  Do not drink alcohol if your health care provider tells you not to drink.  If you drink alcohol, limit how much you have: ? 0-1 drink a day for women. ? 0-2 drinks a day for men.  Be aware of how much alcohol is in your drink. In the [place] one drink equals one typical bottle of beer (12 oz), one-half glass of wine (5 oz), or one shot of hard liquor (1 oz).  Do not use any products that contain nicotine or tobacco, such as cigarettes and e-cigarettes. If you need help quitting, ask your health care  provider. Summary  Having a healthy lifestyle and getting preventive care can help to protect your health and wellness after age 74.  Screening and testing are the best way to find a health problem early and help you avoid having a fall. Early diagnosis and treatment give you the best chance for managing medical conditions that are more common for people who are older than age 24.  Falls are a major cause of broken bones and head injuries in people who are older than age 74. Take precautions to prevent a fall at home.  Work with your health  care provider to learn what changes you can make to improve your health and wellness and to prevent falls. This information is not intended to replace advice given to you by your health care provider. Make sure you discuss any questions you have with your health care provider. Document Revised: 02/01/2019 Document Reviewed: 08/24/2017 Elsevier Patient Education  2020 Elsevier Inc.      Edwina Barth, MD [facility]

## 2020-05-02 LAB — CMP14+EGFR
ALT: 16 IU/L (ref 0–32)
AST: 22 IU/L (ref 0–40)
Albumin/Globulin Ratio: 1.5 (ref 1.2–2.2)
Albumin: 4.3 g/dL (ref 3.7–4.7)
Alkaline Phosphatase: 82 IU/L (ref 48–121)
BUN/Creatinine Ratio: 14 (ref 12–28)
BUN: 16 mg/dL (ref 8–27)
Bilirubin Total: 0.4 mg/dL (ref 0.0–1.2)
CO2: 23 mmol/L (ref 20–29)
Calcium: 9.3 mg/dL (ref 8.7–10.3)
Chloride: 103 mmol/L (ref 96–106)
Creatinine, Ser: 1.13 mg/dL — ABNORMAL HIGH (ref 0.57–1.00)
GFR calc Af Amer: 55 mL/min/{1.73_m2} — ABNORMAL LOW (ref >59)
GFR calc non Af Amer: 48 mL/min/{1.73_m2} — ABNORMAL LOW (ref >59)
Globulin, Total: 2.8 g/dL (ref 1.5–4.5)
Glucose: 109 mg/dL — ABNORMAL HIGH (ref 65–99)
Potassium: 4.3 mmol/L (ref 3.5–5.2)
Sodium: 138 mmol/L (ref 134–144)
Total Protein: 7.1 g/dL (ref 6.0–8.5)

## 2020-05-02 LAB — LIPID PANEL
Chol/HDL Ratio: 3.6 ratio (ref 0.0–4.4)
Cholesterol, Total: 228 mg/dL — ABNORMAL HIGH (ref 100–199)
HDL: 63 mg/dL (ref >39)
LDL Chol Calc (NIH): 138 mg/dL — ABNORMAL HIGH (ref 0–99)
Triglycerides: 153 mg/dL — ABNORMAL HIGH (ref 0–149)
VLDL Cholesterol Cal: 27 mg/dL (ref 5–40)

## 2020-05-02 LAB — TSH: TSH: 3.17 u[IU]/mL (ref 0.450–4.500)

## 2020-05-02 LAB — VITAMIN D 25 HYDROXY (VIT D DEFICIENCY, FRACTURES): Vit D, 25-Hydroxy: 30.2 ng/mL (ref 30.0–100.0)

## 2020-05-03 ENCOUNTER — Other Ambulatory Visit: Payer: Self-pay | Admitting: Emergency Medicine

## 2020-05-03 DIAGNOSIS — R944 Abnormal results of kidney function studies: Secondary | ICD-10-CM

## 2020-05-21 NOTE — Telephone Encounter (Signed)
Duplicate note

## 2020-06-18 ENCOUNTER — Other Ambulatory Visit: Payer: Self-pay | Admitting: Emergency Medicine

## 2020-06-18 DIAGNOSIS — E039 Hypothyroidism, unspecified: Secondary | ICD-10-CM

## 2020-06-18 NOTE — Telephone Encounter (Signed)
Requested Prescriptions  Pending Prescriptions Disp Refills  . levothyroxine (SYNTHROID) 50 MCG tablet [Pharmacy Med Name: LEVOTHYROXINE 0.05MG  ( ) TAB] 90 tablet 0    Sig: TAKE 1 TABLET BY MOUTH EVERY DAY BEFORE BREAKFAST     Endocrinology:  Hypothyroid Agents Failed - 06/18/2020  7:10 PM      Failed - TSH needs to be rechecked within 3 months after an abnormal result. Refill until TSH is due.      Passed - TSH in normal range and within 360 days    TSH  Date Value Ref Range Status  05/01/2020 3.170 0.450 - 4.500 uIU/mL Final         Passed - Valid encounter within last 12 months    Recent Outpatient Visits          1 month ago Hypothyroidism, unspecified type   Primary Care at [facility], Eilleen Kempf, MD   7 months ago Dyslipidemia   Primary Care at [facility], Eilleen Kempf, MD   1 year ago General weakness   Primary Care at [place], Eilleen Kempf, MD   1 year ago Medicare annual wellness visit, subsequent   Primary Care at [facility], Eilleen Kempf, MD   1 year ago Fibromyalgia   Primary Care at [facility], [place], [place]

## 2020-07-03 ENCOUNTER — Ambulatory Visit (INDEPENDENT_AMBULATORY_CARE_PROVIDER_SITE_OTHER): Payer: Medicare Other | Admitting: Emergency Medicine

## 2020-07-03 VITALS — BP 118/76 | Ht 65.5 in | Wt 189.0 lb

## 2020-07-03 DIAGNOSIS — Z0000 Encounter for general adult medical examination without abnormal findings: Secondary | ICD-10-CM | POA: Diagnosis not present

## 2020-07-03 NOTE — Progress Notes (Addendum)
Presents today for [employer] Visit   Date of last exam:[id number]  Interpreter used for this visit? No  I connected with  Jacqueline Wyatt on 07/03/20 by a telephone  application and verified that I am speaking with the correct person using two identifiers.   I discussed the limitations of evaluation and management by telemedicine. The patient expressed understanding and agreed to proceed.  Patient location: home  Provider location: in office  I provided 20 minutes of non face - to - face time during this encounter.   Patient Care Team: Horald Pollen, MD as PCP - General (Internal Medicine)   Other items to address today:   Discussed Eye Dental Discussed immunizations    Other Screening: Last screening for diabetes: 05-11-2020 Last lipid screening: 05/11/2020  ADVANCE DIRECTIVES: Discussed:yes On File: no Materials Provided: no (patient has the paperwork)  Immunization status:  Immunization History  Administered Date(s) Administered   MMR 11/23/2009   PFIZER SARS-COV-2 Vaccination 03/02/2020, 04/02/2020   Pneumococcal Conjugate-13 09/09/2014   Pneumococcal Polysaccharide-23 05/01/2020   Td 11/23/2002   Tdap 07/31/2012     There are no preventive care reminders to display for this patient.   Functional Status Survey: Is the patient deaf or have difficulty hearing?: No Does the patient have difficulty seeing, even when wearing glasses/contacts?: No Does the patient have difficulty concentrating, remembering, or making decisions?: No Does the patient have difficulty walking or climbing stairs?: No (does get tired) Does the patient have difficulty dressing or bathing?: No Does the patient have difficulty doing errands alone such as visiting a doctor's office or shopping?: No   6CIT Screen 07/03/2020 03/08/2019  What Year? 0 points 0 points  What month? 0 points 0 points  What time? 0 points 0 points  Count back from 20 0 points  0 points  Months in reverse 0 points 0 points  Repeat phrase - 0 points  Total Score - 0        Clinical Support from 07/03/2020 in [facility] at [facility]  AUDIT-C Score 0       Home Environment:   Lives in 3 level home  No trouble climbing stairs/gets very tired No scattered rugs Yes grab bars  Adequate lighting/ no clutter   Patient Active Problem List   Diagnosis Date Noted   Stage 3a chronic kidney disease 11/07/2019   Dyslipidemia 11/07/2019   Chronic pain syndrome 08/05/2017   Vitamin D deficiency 08/05/2017   Generalized anxiety disorder 09/09/2014   Hypothyroidism 11/24/2011   Migraine 11/24/2011   Environmental allergies 11/24/2011   Fibromyalgia 11/24/2011   Irritable bowel syndrome (IBS) 11/24/2011   Osteoporosis 11/24/2011     Past Medical History:  Diagnosis Date   Allergy    Arthritis    Asthma    Fibromyalgia    Fibromyalgia muscle pain    Neuromuscular disorder ([place])    Osteoporosis    Thyroid disease      History reviewed. No pertinent surgical history.   Family History  Problem Relation Age of Onset   Heart disease Mother    Hypertension Mother    Hyperlipidemia Mother    Hearing loss Mother    Kidney disease Mother    Vision loss Mother    Lung disease Mother    Stroke Father    Arthritis Sister    Meniere's disease Brother    Cancer Maternal Grandmother    Stroke Maternal Grandmother    Heart disease  Maternal Grandfather    Heart disease Maternal Uncle      Social History   Socioeconomic History   Marital status: Unknown    Spouse name: Not on file   Number of children: Not on file   Years of education: Not on file   Highest education level: Not on file  Occupational History   Not on file  Tobacco Use   Smoking status: Never Smoker   Smokeless tobacco: Never Used  Substance and Sexual Activity   Alcohol use: Not on file   Drug use: Not on file   Sexual activity: Not on  file  Other Topics Concern   Not on file  Social History Narrative   Not on file   Social Determinants of Health   Financial Resource Strain:    Difficulty of Paying Living Expenses: Not on file  Food Insecurity:    Worried About [place] in the Last Year: Not on file   Ran Out of Food in the Last Year: Not on file  Transportation Needs:    Lack of Transportation (Medical): Not on file   Lack of Transportation (Non-Medical): Not on file  Physical Activity:    Days of Exercise per Week: Not on file   Minutes of Exercise per Session: Not on file  Stress:    Feeling of Stress : Not on file  Social Connections:    Frequency of Communication with Friends and Family: Not on file   Frequency of Social Gatherings with Friends and Family: Not on file   Attends Religious Services: Not on file   Active Member of Clubs or Organizations: Not on file   Attends Archivist Meetings: Not on file   Marital Status: Not on file  Intimate Partner Violence:    Fear of Current or Ex-Partner: Not on file   Emotionally Abused: Not on file   Physically Abused: Not on file   Sexually Abused: Not on file     Allergies  Allergen Reactions   Almotriptan Malate     Chest pain   Eggs Or Egg-Derived Products     Stomach pain   Imitrex [Sumatriptan Base]    Penicillins    Relpax [Eletriptan Hydrobromide] Hypertension   Sulfa Drugs Cross Reactors    Vantin     dizziness   Azithromycin Anxiety     Prior to Admission medications   Medication Sig Start Date End Date Taking? Authorizing Provider  calcium-vitamin D (OSCAL WITH D) 500-200 MG-UNIT per tablet Take 1 tablet by mouth.    Yes [provider]  co-enzyme Q-10 30 MG capsule Take 30 mg by mouth 3 (three) times daily.   Yes [provider]  levothyroxine (SYNTHROID) 50 MCG tablet TAKE 1 TABLET BY MOUTH EVERY DAY BEFORE BREAKFAST 06/18/20  Yes Sagardia, Ines Bloomer, MD    loratadine (CLARITIN) 10 MG tablet Take 10 mg by mouth daily.   Yes [provider]  rosuvastatin (CRESTOR) 10 MG tablet Take 1 tablet (10 mg total) by mouth daily. 11/08/19  Yes Sagardia, Ines Bloomer, MD  Vitamin D, Ergocalciferol, (DRISDOL) 1.25 MG (50000 UNIT) CAPS capsule Take 1 capsule (50,000 Units total) by mouth every 7 (seven) days. 11/08/19  Yes SagardiaInes Bloomer, MD  aspirin 81 MG tablet Take 81 mg by mouth daily.  Patient not taking: Reported on 07/03/2020    [provider]  Biotin 1 MG CAPS Take by mouth.  Patient not taking: Reported on 07/03/2020  [provider]  traMADol (ULTRAM) 50 MG tablet Take 1 tablet (50 mg total) by mouth 2 (two) times daily. As needed. Patient not taking: Reported on 07/03/2020 03/26/20   Horald Pollen, MD     Depression screen [facility] 2/9 07/03/2020 05/01/2020 11/07/2019 05/08/2019 11/24/2018  Decreased Interest 0 0 0 0 0  Down, Depressed, Hopeless 0 0 0 0 0  PHQ - 2 Score 0 0 0 0 0     Fall Risk  07/03/2020 05/01/2020 11/07/2019 05/08/2019 03/08/2019  Falls in the past year? 0 0 0 0 0  Number falls in past yr: 1 - - 0 0  Injury with Fall? 0 - - 0 0  Follow up Falls evaluation completed;Education provided Falls evaluation completed Falls evaluation completed - -      PHYSICAL EXAM: BP 118/76 Comment: taken from a previous visit/ not in clinic   Ht 5' 5.5" (1.664 m)    Wt 189 lb (85.7 kg)    BMI 30.97 kg/m    Wt Readings from Last 3 Encounters:  07/03/20 189 lb (85.7 kg)  05/01/20 189 lb (85.7 kg)  04/15/20 185 lb (83.9 kg)    Medicare annual wellness visit, subsequent   I have reviewed and agree with the above AWV documentation. Agustina Caroli, MD Patient: Home  Provider: Office       Education/Counseling provided regarding diet and exercise, prevention of chronic diseases, smoking/tobacco cessation, if applicable, and reviewed "Covered Medicare Preventive Services."

## 2020-07-03 NOTE — Patient Instructions (Addendum)
Thank you for taking time to come for your Medicare Wellness Visit. I appreciate your ongoing commitment to your health goals. Please review the following plan we discussed and let me know if I can assist you in the future.  Shonna Deiter LPN  Preventive Care 74 Years and Older, Female Preventive care refers to lifestyle choices and visits with your health care provider that can promote health and wellness. This includes:  A yearly physical exam. This is also called an annual well check.  Regular dental and eye exams.  Immunizations.  Screening for certain conditions.  Healthy lifestyle choices, such as diet and exercise. What can I expect for my preventive care visit? Physical exam Your health care provider will check:  Height and weight. These may be used to calculate body mass index (BMI), which is a measurement that tells if you are at a healthy weight.  Heart rate and blood pressure.  Your skin for abnormal spots. Counseling Your health care provider may ask you questions about:  Alcohol, tobacco, and drug use.  Emotional well-being.  Home and relationship well-being.  Sexual activity.  Eating habits.  History of falls.  Memory and ability to understand (cognition).  Work and work environment.  Pregnancy and menstrual history. What immunizations do I need?  Influenza (flu) vaccine  This is recommended every year. Tetanus, diphtheria, and pertussis (Tdap) vaccine  You may need a Td booster every 10 years. Varicella (chickenpox) vaccine  You may need this vaccine if you have not already been vaccinated. Zoster (shingles) vaccine  You may need this after age 74. Pneumococcal conjugate (PCV13) vaccine  One dose is recommended after age 74. Pneumococcal polysaccharide (PPSV23) vaccine  One dose is recommended after age 74. Measles, mumps, and rubella (MMR) vaccine  You may need at least one dose of MMR if you were born in 1957 or later. You may also  need a second dose. Meningococcal conjugate (MenACWY) vaccine  You may need this if you have certain conditions. Hepatitis A vaccine  You may need this if you have certain conditions or if you travel or work in places where you may be exposed to hepatitis A. Hepatitis B vaccine  You may need this if you have certain conditions or if you travel or work in places where you may be exposed to hepatitis B. Haemophilus influenzae type b (Hib) vaccine  You may need this if you have certain conditions. You may receive vaccines as individual doses or as more than one vaccine together in one shot (combination vaccines). Talk with your health care provider about the risks and benefits of combination vaccines. What tests do I need? Blood tests  Lipid and cholesterol levels. These may be checked every 5 years, or more frequently depending on your overall health.  Hepatitis C test.  Hepatitis B test. Screening  Lung cancer screening. You may have this screening every year starting at age 55 if you have a 30-pack-year history of smoking and currently smoke or have quit within the past 15 years.  Colorectal cancer screening. All adults should have this screening starting at age 50 and continuing until age 75. Your health care provider may recommend screening at age 45 if you are at increased risk. You will have tests every 1-10 years, depending on your results and the type of screening test.  Diabetes screening. This is done by checking your blood sugar (glucose) after you have not eaten for a while (fasting). You may have this done every 1-3   years.  Mammogram. This may be done every 1-2 years. Talk with your health care provider about how often you should have regular mammograms.  BRCA-related cancer screening. This may be done if you have a family history of breast, ovarian, tubal, or peritoneal cancers. Other tests  Sexually transmitted disease (STD) testing.  Bone density scan. This is done  to screen for osteoporosis. You may have this done starting at age 74. Follow these instructions at home: Eating and drinking  Eat a diet that includes fresh fruits and vegetables, whole grains, lean protein, and Flath-fat dairy products. Limit your intake of foods with high amounts of sugar, saturated fats, and salt.  Take vitamin and mineral supplements as recommended by your health care provider.  Do not drink alcohol if your health care provider tells you not to drink.  If you drink alcohol: ? Limit how much you have to 0-1 drink a day. ? Be aware of how much alcohol is in your drink. In the [place] one drink equals one 12 oz bottle of beer (355 mL), one 5 oz glass of wine (148 mL), or one 1 oz glass of hard liquor (44 mL). Lifestyle  Take daily care of your teeth and gums.  Stay active. Exercise for at least 30 minutes on 5 or more days each week.  Do not use any products that contain nicotine or tobacco, such as cigarettes, e-cigarettes, and chewing tobacco. If you need help quitting, ask your health care provider.  If you are sexually active, practice safe sex. Use a condom or other form of protection in order to prevent STIs (sexually transmitted infections).  Talk with your health care provider about taking a Knock-dose aspirin or statin. What's next?  Go to your health care provider once a year for a well check visit.  Ask your health care provider how often you should have your eyes and teeth checked.  Stay up to date on all vaccines. This information is not intended to replace advice given to you by your health care provider. Make sure you discuss any questions you have with your health care provider. Document Revised: 10/05/2018 Document Reviewed: 10/05/2018 Elsevier Patient Education  2020 [employer].

## 2020-11-17 ENCOUNTER — Other Ambulatory Visit: Payer: Self-pay

## 2020-11-17 ENCOUNTER — Ambulatory Visit (INDEPENDENT_AMBULATORY_CARE_PROVIDER_SITE_OTHER): Payer: Medicare Other | Admitting: Family Medicine

## 2020-11-17 ENCOUNTER — Encounter: Payer: Self-pay | Admitting: Family Medicine

## 2020-11-17 VITALS — BP 134/92 | HR 85 | Temp 97.6°F | Ht 65.5 in | Wt 195.0 lb

## 2020-11-17 DIAGNOSIS — M797 Fibromyalgia: Secondary | ICD-10-CM

## 2020-11-17 DIAGNOSIS — N1831 Chronic kidney disease, stage 3a: Secondary | ICD-10-CM | POA: Diagnosis not present

## 2020-11-17 DIAGNOSIS — E559 Vitamin D deficiency, unspecified: Secondary | ICD-10-CM | POA: Diagnosis not present

## 2020-11-17 DIAGNOSIS — E785 Hyperlipidemia, unspecified: Secondary | ICD-10-CM

## 2020-11-17 DIAGNOSIS — G894 Chronic pain syndrome: Secondary | ICD-10-CM

## 2020-11-17 DIAGNOSIS — E039 Hypothyroidism, unspecified: Secondary | ICD-10-CM | POA: Diagnosis not present

## 2020-11-17 DIAGNOSIS — R531 Weakness: Secondary | ICD-10-CM

## 2020-11-17 MED ORDER — TRAMADOL HCL 50 MG PO TABS: 50.0000 mg | ORAL_TABLET | Freq: Two times a day (BID) | ORAL | 3 refills | Status: DC

## 2020-11-17 MED ORDER — VITAMIN D (ERGOCALCIFEROL) 1.25 MG (50000 UNIT) PO CAPS: 50000.0000 [IU] | ORAL_CAPSULE | ORAL | 12 refills | Status: DC

## 2020-11-17 MED ORDER — ROSUVASTATIN CALCIUM 10 MG PO TABS: 10.0000 mg | ORAL_TABLET | Freq: Every day | ORAL | 3 refills | Status: DC

## 2020-11-17 NOTE — Patient Instructions (Addendum)
  Health Maintenance After Age 75 After age 75, you are at a higher risk for certain long-term diseases and infections as well as injuries from falls. Falls are a major cause of broken bones and head injuries in people who are older than age 75. Getting regular preventive care can help to keep you healthy and well. Preventive care includes getting regular testing and making lifestyle changes as recommended by your health care provider. Talk with your health care provider about:  Which screenings and tests you should have. A screening is a test that checks for a disease when you have no symptoms.  A diet and exercise plan that is right for you. What should I know about screenings and tests to prevent falls? Screening and testing are the best ways to find a health problem early. Early diagnosis and treatment give you the best chance of managing medical conditions that are common after age 75. Certain conditions and lifestyle choices may make you more likely to have a fall. Your health care provider may recommend:  Regular vision checks. Poor vision and conditions such as cataracts can make you more likely to have a fall. If you wear glasses, make sure to get your prescription updated if your vision changes.  Medicine review. Work with your health care provider to regularly review all of the medicines you are taking, including over-the-counter medicines. Ask your health care provider about any side effects that may make you more likely to have a fall. Tell your health care provider if any medicines that you take make you feel dizzy or sleepy.  Osteoporosis screening. Osteoporosis is a condition that causes the bones to get weaker. This can make the bones weak and cause them to break more easily.  Blood pressure screening. Blood pressure changes and medicines to control blood pressure can make you feel dizzy.  Strength and balance checks. Your health care provider may recommend certain tests to  check your strength and balance while standing, walking, or changing positions.  Foot health exam. Foot pain and numbness, as well as not wearing proper footwear, can make you more likely to have a fall.  Depression screening. You may be more likely to have a fall if you have a fear of falling, feel emotionally [place], or feel unable to do activities that you used to do.  Alcohol use screening. Using too much alcohol can affect your balance and may make you more likely to have a fall. What actions can I take to lower my risk of falls? General instructions  Talk with your health care provider about your risks for falling. Tell your health care provider if: ? You fall. Be sure to tell your health care provider about all falls, even ones that seem minor. ? You feel dizzy, sleepy, or off-balance.  Take over-the-counter and prescription medicines only as told by your health care provider. These include any supplements.  Eat a healthy diet and maintain a healthy weight. A healthy diet includes Hata-fat dairy products, Gendron-fat (lean) meats, and fiber from whole grains, beans, and lots of fruits and vegetables. Home safety  Remove any tripping hazards, such as rugs, cords, and clutter.  Install safety equipment such as grab bars in bathrooms and safety rails on stairs.  Keep rooms and walkways well-lit. Activity  Follow a regular exercise program to stay fit. This will help you maintain your balance. Ask your health care provider what types of exercise are appropriate for you.  If you need a cane   or walker, use it as recommended by your health care provider.  Wear supportive shoes that have nonskid soles.   Lifestyle  Do not drink alcohol if your health care provider tells you not to drink.  If you drink alcohol, limit how much you have: ? 0-1 drink a day for women. ? 0-2 drinks a day for men.  Be aware of how much alcohol is in your drink. In the [place] one drink equals one typical bottle  of beer (12 oz), one-half glass of wine (5 oz), or one shot of hard liquor (1 oz).  Do not use any products that contain nicotine or tobacco, such as cigarettes and e-cigarettes. If you need help quitting, ask your health care provider. Summary  Having a healthy lifestyle and getting preventive care can help to protect your health and wellness after age 75.  Screening and testing are the best way to find a health problem early and help you avoid having a fall. Early diagnosis and treatment give you the best chance for managing medical conditions that are more common for people who are older than age 75.  Falls are a major cause of broken bones and head injuries in people who are older than age 75. Take precautions to prevent a fall at home.  Work with your health care provider to learn what changes you can make to improve your health and wellness and to prevent falls. This information is not intended to replace advice given to you by your health care provider. Make sure you discuss any questions you have with your health care provider. Document Revised: 02/01/2019 Document Reviewed: 08/24/2017 Elsevier Patient Education  2021 Elsevier Inc.   If you have lab work done today you will be contacted with your lab results within the next 2 weeks.  If you have not heard from us then please contact us. The fastest way to get your results is to register for My Chart.   IF you received an x-ray today, you will receive an invoice from [facility]. Please contact [facility] at [phone] with questions or concerns regarding your invoice.   IF you received labwork today, you will receive an invoice from [facility]. Please contact [facility] at [phone] with questions or concerns regarding your invoice.   Our billing staff will not be able to assist you with questions regarding bills from these companies.  You will be contacted with the lab results as soon as they are available.  The fastest way to get your results is to activate your My Chart account. Instructions are located on the last page of this paperwork. If you have not heard from us regarding the results in 2 weeks, please contact this office.      

## 2020-11-17 NOTE — Progress Notes (Signed)
1/24/20221:59 PM  Jacqueline Wyatt [birth year], 75 y.o., female [id number]  Chief Complaint  Patient presents with  . Medication Refill    Tramadol , vitamin d     HPI:   Patient is a 75 y.o. female with past medical history significant for Migraine, CKD IIIa, back pain Hypothyroid, vitamin D deficiency who presents today for medication refills.  Increased issues with fatigue over the past 2 years Would like her B12 checked  Has been on tramadol for 10 years Takes 1/2 tab twice a day Occassionally needs an extra dose   HTN Has a BP cuff at home  BP Readings from Last 3 Encounters:  11/17/20 (!) 154/81  07/03/20 118/76  05/01/20 119/81    Never started taking the Crestor  Lab Results  Component Value Date   CHOL 228 (H) 05/01/2020   HDL 63 05/01/2020   LDLCALC 138 (H) 05/01/2020   TRIG 153 (H) 05/01/2020   CHOLHDL 3.6 05/01/2020   The 10-year ASCVD risk score Mikey Bussing DC Jr., et al., 2013) is: 20.4%   Values used to calculate the score:     Age: 75 years     Sex: Female     Is Non-Hispanic African American: No     Diabetic: No     Tobacco smoker: No     Systolic Blood Pressure: 737 mmHg     Is BP treated: No     HDL Cholesterol: 63 mg/dL     Total Cholesterol: 228 mg/dL   Depression screen [facility] 2/9 11/17/2020 07/03/2020 05/01/2020  Decreased Interest 0 0 0  Down, Depressed, Hopeless 0 0 0  PHQ - 2 Score 0 0 0    Fall Risk  11/17/2020 07/03/2020 05/01/2020 11/07/2019 05/08/2019  Falls in the past year? 0 0 0 0 0  Number falls in past yr: 0 1 - - 0  Injury with Fall? 0 0 - - 0  Follow up Falls evaluation completed Falls evaluation completed;Education provided Falls evaluation completed Falls evaluation completed -     Allergies  Allergen Reactions  . Almotriptan Malate     Chest pain  . Eggs Or Egg-Derived Products     Stomach pain  . Imitrex [Sumatriptan Base]   . Penicillins   . Relpax [Eletriptan Hydrobromide] Hypertension  . Sulfa Drugs Cross Reactors   .  Vantin     dizziness  . Azithromycin Anxiety    Prior to Admission medications   Medication Sig Start Date End Date Taking? Authorizing Provider  aspirin 81 MG tablet Take 81 mg by mouth daily.  Patient not taking: Reported on 07/03/2020    [provider]  Biotin 1 MG CAPS Take by mouth.  Patient not taking: Reported on 07/03/2020    [provider]  calcium-vitamin D (OSCAL WITH D) 500-200 MG-UNIT per tablet Take 1 tablet by mouth.     [provider]  co-enzyme Q-10 30 MG capsule Take 30 mg by mouth 3 (three) times daily.    [provider]  levothyroxine (SYNTHROID) 50 MCG tablet TAKE 1 TABLET BY MOUTH EVERY DAY BEFORE BREAKFAST 06/18/20   Horald Pollen, MD  loratadine (CLARITIN) 10 MG tablet Take 10 mg by mouth daily.    [provider]  rosuvastatin (CRESTOR) 10 MG tablet Take 1 tablet (10 mg total) by mouth daily. 11/08/19   Horald Pollen, MD  traMADol (ULTRAM) 50 MG tablet Take 1 tablet (50 mg total) by mouth 2 (two) times daily. As needed.  Patient not taking: Reported on 07/03/2020 03/26/20   Horald Pollen, MD  Vitamin D, Ergocalciferol, (DRISDOL) 1.25 MG (50000 UNIT) CAPS capsule Take 1 capsule (50,000 Units total) by mouth every 7 (seven) days. 11/08/19   Horald Pollen, MD    Past Medical History:  Diagnosis Date  . Allergy   . Arthritis   . Asthma   . Fibromyalgia   . Fibromyalgia muscle pain   . Neuromuscular disorder ([place])   . Osteoporosis   . Thyroid disease     History reviewed. No pertinent surgical history.  Social History   Tobacco Use  . Smoking status: Never Smoker  . Smokeless tobacco: Never Used  Substance Use Topics  . Alcohol use: Not on file    Family History  Problem Relation Age of Onset  . Heart disease Mother   . Hypertension Mother   . Hyperlipidemia Mother   . Hearing loss Mother   . Kidney disease Mother   . Vision loss Mother   . Lung disease Mother   . Stroke  Father   . Arthritis Sister   . Meniere's disease Brother   . Cancer Maternal Grandmother   . Stroke Maternal Grandmother   . Heart disease Maternal Grandfather   . Heart disease Maternal Uncle     Review of Systems  Constitutional: Negative for chills, fever and malaise/fatigue.  Eyes: Negative for blurred vision and double vision.  Respiratory: Negative for cough, shortness of breath and wheezing.   Cardiovascular: Negative for chest pain, palpitations and leg swelling.  Gastrointestinal: Negative for abdominal pain, blood in stool, constipation, diarrhea, heartburn, nausea and vomiting.  Genitourinary: Negative for dysuria, frequency and hematuria.  Musculoskeletal: Positive for myalgias. Negative for back pain and joint pain.  Skin: Negative for rash.  Neurological: Negative for dizziness, weakness and headaches.     OBJECTIVE:  Today's Vitals   11/17/20 1335  BP: (!) 154/81  Pulse: 85  Temp: 97.6 F (36.4 C)  SpO2: 97%  Weight: 195 lb (88.5 kg)  Height: 5' 5.5" (1.664 m)   Body mass index is 31.96 kg/m.   Physical Exam Constitutional:      General: She is not in acute distress.    Appearance: Normal appearance. She is not ill-appearing.  HENT:     Head: Normocephalic.  Cardiovascular:     Rate and Rhythm: Normal rate and regular rhythm.     Pulses: Normal pulses.     Heart sounds: Normal heart sounds. No murmur heard. No friction rub. No gallop.   Pulmonary:     Effort: Pulmonary effort is normal. No respiratory distress.     Breath sounds: Normal breath sounds. No stridor. No wheezing, rhonchi or rales.  Abdominal:     General: Bowel sounds are normal.     Palpations: Abdomen is soft.     Tenderness: There is no abdominal tenderness.  Musculoskeletal:     Thoracic back: Normal.     Lumbar back: Normal.     Right lower leg: No edema.     Left lower leg: No edema.  Skin:    General: Skin is warm and dry.  Neurological:     Mental Status: She is  alert and oriented to person, place, and time.  Psychiatric:        Mood and Affect: Mood normal.        Behavior: Behavior normal.     No results found for this or any previous visit (from the past 24  hour(s)).  No results found.   ASSESSMENT and PLAN  Problem List Items Addressed This Visit      Endocrine   Hypothyroidism   Relevant Orders   TSH     Genitourinary   Stage 3a chronic kidney disease (HCC)   Relevant Orders   CMP14+EGFR     Other   Fibromyalgia (Chronic)   Relevant Medications   traMADol (ULTRAM) 50 MG tablet   Chronic pain syndrome   Relevant Medications   traMADol (ULTRAM) 50 MG tablet   Vitamin D deficiency   Relevant Medications   Vitamin D, Ergocalciferol, (DRISDOL) 1.25 MG (50000 UNIT) CAPS capsule   Other Relevant Orders   Vitamin D, 25-hydroxy   Dyslipidemia - Primary   Relevant Medications   rosuvastatin (CRESTOR) 10 MG tablet   Other Relevant Orders   Lipid Panel    Other Visit Diagnoses    General weakness       Relevant Orders   Vitamin B12       Plan . Encouraged to check BP at home . Encouraged to restart crestor . Will follow up with labs   Return in about 6 months (around 05/17/2021).    Huston Foley Yannis Gumbs, FNP-BC [facility] [place], [place] [address] Ph.  [phone] Fax [phone]

## 2020-11-18 ENCOUNTER — Other Ambulatory Visit: Payer: Self-pay | Admitting: Family Medicine

## 2020-11-18 DIAGNOSIS — E785 Hyperlipidemia, unspecified: Secondary | ICD-10-CM

## 2020-11-18 LAB — CMP14+EGFR
ALT: 15 IU/L (ref 0–32)
AST: 21 IU/L (ref 0–40)
Albumin/Globulin Ratio: 1.2 (ref 1.2–2.2)
Albumin: 4.1 g/dL (ref 3.7–4.7)
Alkaline Phosphatase: 82 IU/L (ref 44–121)
BUN/Creatinine Ratio: 13 (ref 12–28)
BUN: 14 mg/dL (ref 8–27)
Bilirubin Total: 0.4 mg/dL (ref 0.0–1.2)
CO2: 20 mmol/L (ref 20–29)
Calcium: 9.3 mg/dL (ref 8.7–10.3)
Chloride: 102 mmol/L (ref 96–106)
Creatinine, Ser: 1.07 mg/dL — ABNORMAL HIGH (ref 0.57–1.00)
GFR calc Af Amer: 59 mL/min/{1.73_m2} — ABNORMAL LOW (ref >59)
GFR calc non Af Amer: 51 mL/min/{1.73_m2} — ABNORMAL LOW (ref >59)
Globulin, Total: 3.3 g/dL (ref 1.5–4.5)
Glucose: 111 mg/dL — ABNORMAL HIGH (ref 65–99)
Potassium: 4.7 mmol/L (ref 3.5–5.2)
Sodium: 139 mmol/L (ref 134–144)
Total Protein: 7.4 g/dL (ref 6.0–8.5)

## 2020-11-18 LAB — LIPID PANEL
Chol/HDL Ratio: 3.1 ratio (ref 0.0–4.4)
Cholesterol, Total: 227 mg/dL — ABNORMAL HIGH (ref 100–199)
HDL: 73 mg/dL (ref >39)
LDL Chol Calc (NIH): 131 mg/dL — ABNORMAL HIGH (ref 0–99)
Triglycerides: 130 mg/dL (ref 0–149)
VLDL Cholesterol Cal: 23 mg/dL (ref 5–40)

## 2020-11-18 LAB — TSH: TSH: 3.63 u[IU]/mL (ref 0.450–4.500)

## 2020-11-18 LAB — VITAMIN D 25 HYDROXY (VIT D DEFICIENCY, FRACTURES): Vit D, 25-Hydroxy: 31.7 ng/mL (ref 30.0–100.0)

## 2020-11-18 LAB — VITAMIN B12: Vitamin B-12: 783 pg/mL (ref 232–1245)

## 2020-11-18 MED ORDER — ROSUVASTATIN CALCIUM 20 MG PO TABS: 20.0000 mg | ORAL_TABLET | Freq: Every day | ORAL | 3 refills | Status: DC

## 2020-11-18 NOTE — Progress Notes (Signed)
Her kidneys look improved from her last visit and her B12 and vitamin D are normal. Her cholesterol remains elevated. I would recommend increasing her crestor to 20mg  daily.

## 2020-11-25 ENCOUNTER — Other Ambulatory Visit (HOSPITAL_BASED_OUTPATIENT_CLINIC_OR_DEPARTMENT_OTHER): Payer: Self-pay | Admitting: Internal Medicine

## 2020-11-25 ENCOUNTER — Ambulatory Visit: Payer: Self-pay | Attending: Internal Medicine

## 2020-11-25 DIAGNOSIS — Z23 Encounter for immunization: Secondary | ICD-10-CM

## 2020-11-25 MED FILL — PFIZER-BIONTECH COVID-19 VA: 30 | 1 days supply | Qty: 0 | Fill #0

## 2020-11-25 NOTE — Progress Notes (Addendum)
   Covid-19 Vaccination Clinic  Name:  Macel Yearsley    MRN: [id number] DOB: 02/20/1946  11/25/2020  Ms. Weinreb was observed post Covid-19 immunization for 15 minutes without incident. She was provided with Vaccine Information Sheet and instruction to access the V-Safe system. Vaccinated by [employer].  Ms. Tilly was instructed to call 911 with any severe reactions post vaccine: Marland Kitchen Difficulty breathing  . Swelling of face and throat  . A fast heartbeat  . A bad rash all over body  . Dizziness and weakness   Immunizations Administered    Name Date Dose VIS Date Route   PFIZER Comrnaty(Gray TOP) Covid-19 Vaccine 11/25/2020  1:59 PM 0.3 mL 10/02/2020 Intramuscular   Manufacturer: [employer], [employer]   Lot: [id number]   NDC: [phone]

## 2021-02-05 NOTE — Telephone Encounter (Signed)
Opened in error

## 2021-03-23 ENCOUNTER — Emergency Department (HOSPITAL_COMMUNITY): Payer: Medicare Other

## 2021-03-23 ENCOUNTER — Inpatient Hospital Stay (HOSPITAL_COMMUNITY)
Admission: EM | Admit: 2021-03-23 | Discharge: 2021-03-25 | DRG: 287 | Disposition: A | Discharge status: Home / Self Care | Payer: Medicare Other | Attending: Cardiology | Admitting: Cardiology

## 2021-03-23 ENCOUNTER — Other Ambulatory Visit: Payer: Self-pay

## 2021-03-23 DIAGNOSIS — R7989 Other specified abnormal findings of blood chemistry: Secondary | ICD-10-CM | POA: Diagnosis present

## 2021-03-23 DIAGNOSIS — R778 Other specified abnormalities of plasma proteins: Secondary | ICD-10-CM | POA: Diagnosis present

## 2021-03-23 DIAGNOSIS — Z20822 Contact with and (suspected) exposure to covid-19: Secondary | ICD-10-CM | POA: Diagnosis present

## 2021-03-23 DIAGNOSIS — I214 Non-ST elevation (NSTEMI) myocardial infarction: Secondary | ICD-10-CM

## 2021-03-23 DIAGNOSIS — E7800 Pure hypercholesterolemia, unspecified: Secondary | ICD-10-CM | POA: Diagnosis present

## 2021-03-23 DIAGNOSIS — I514 Myocarditis, unspecified: Secondary | ICD-10-CM | POA: Diagnosis not present

## 2021-03-23 DIAGNOSIS — E785 Hyperlipidemia, unspecified: Secondary | ICD-10-CM | POA: Diagnosis present

## 2021-03-23 DIAGNOSIS — Z79890 Hormone replacement therapy (postmenopausal): Secondary | ICD-10-CM

## 2021-03-23 DIAGNOSIS — Z79899 Other long term (current) drug therapy: Secondary | ICD-10-CM

## 2021-03-23 DIAGNOSIS — E039 Hypothyroidism, unspecified: Secondary | ICD-10-CM | POA: Diagnosis present

## 2021-03-23 DIAGNOSIS — M797 Fibromyalgia: Secondary | ICD-10-CM | POA: Diagnosis present

## 2021-03-23 DIAGNOSIS — R079 Chest pain, unspecified: Secondary | ICD-10-CM

## 2021-03-23 DIAGNOSIS — Z7982 Long term (current) use of aspirin: Secondary | ICD-10-CM

## 2021-03-23 LAB — BASIC METABOLIC PANEL
Anion gap: 8 (ref 5–15)
BUN: 16 mg/dL (ref 8–23)
CO2: 27 mmol/L (ref 22–32)
Calcium: 9.1 mg/dL (ref 8.9–10.3)
Chloride: 105 mmol/L (ref 98–111)
Creatinine, Ser: 1.07 mg/dL — ABNORMAL HIGH (ref 0.44–1.00)
GFR, Estimated: 55 mL/min — ABNORMAL LOW (ref >60)
Glucose, Bld: 142 mg/dL — ABNORMAL HIGH (ref 70–99)
Potassium: 4.2 mmol/L (ref 3.5–5.1)
Sodium: 140 mmol/L (ref 135–145)

## 2021-03-23 LAB — CBC WITH DIFFERENTIAL/PLATELET
Abs Immature Granulocytes: 0.01 10*3/uL (ref 0.00–0.07)
Basophils Absolute: 0.1 10*3/uL (ref 0.0–0.1)
Basophils Relative: 1 %
Eosinophils Absolute: 0.2 10*3/uL (ref 0.0–0.5)
Eosinophils Relative: 4 %
HCT: 42.5 % (ref 36.0–46.0)
Hemoglobin: 13.9 g/dL (ref 12.0–15.0)
Immature Granulocytes: 0 %
Lymphocytes Relative: 50 %
Lymphs Abs: 2.7 10*3/uL (ref 0.7–4.0)
MCH: 31.7 pg (ref 26.0–34.0)
MCHC: 32.7 g/dL (ref 30.0–36.0)
MCV: 97 fL (ref 80.0–100.0)
Monocytes Absolute: 0.6 10*3/uL (ref 0.1–1.0)
Monocytes Relative: 11 %
Neutro Abs: 1.9 10*3/uL (ref 1.7–7.7)
Neutrophils Relative %: 34 %
Platelets: 240 10*3/uL (ref 150–400)
RBC: 4.38 MIL/uL (ref 3.87–5.11)
RDW: 13.1 % (ref 11.5–15.5)
WBC: 5.5 10*3/uL (ref 4.0–10.5)
nRBC: 0 % (ref 0.0–0.2)

## 2021-03-23 LAB — TROPONIN I (HIGH SENSITIVITY): Troponin I (High Sensitivity): 18 ng/L — ABNORMAL HIGH (ref <18)

## 2021-03-23 MED ORDER — NITROGLYCERIN 0.4 MG SL SUBL: 0.4000 mg | SUBLINGUAL_TABLET | SUBLINGUAL | Status: DC | PRN

## 2021-03-23 MED ORDER — ASPIRIN 81 MG PO CHEW
324.0000 mg | CHEWABLE_TABLET | Freq: Once | ORAL | Status: AC
  Administered 2021-03-24: 324 mg via ORAL
  Filled 2021-03-23: qty 4

## 2021-03-23 MED ORDER — MORPHINE SULFATE (PF) 4 MG/ML IV SOLN
4.0000 mg | Freq: Once | INTRAVENOUS | Status: AC
  Administered 2021-03-23: 4 mg via INTRAVENOUS
  Filled 2021-03-23: qty 1

## 2021-03-23 NOTE — ED Provider Notes (Signed)
Emergency Medicine Provider Triage Evaluation Note  Jacqueline Wyatt , a 75 y.o. female  was evaluated in triage.  Pt complains of chest pain that started earlier today while walking in her house. She denies associated sob or nausea but does report some diaphoresis.  Review of Systems  Positive: Chest pain Negative: Sob, diaphoresis  Physical Exam  Ht 5\' 5"  (1.651 m)   Wt 86.2 kg   BMI 31.62 kg/m  Gen:   Awake, no distress   Resp:  Normal effort  MSK:   Moves extremities without difficulty  Other:  Heart with rrr  Medical Decision Making  Medically screening exam initiated at 8:38 PM.  Appropriate orders placed.  Tarhonda Kaus was informed that the remainder of the evaluation will be completed by another provider, this initial triage assessment does not replace that evaluation, and the importance of remaining in the ED until their evaluation is complete.     Bosie Clos 03/23/21 2038    [place], 2039, MD 03/23/21 2255

## 2021-03-23 NOTE — ED Provider Notes (Signed)
[facility] EMERGENCY DEPARTMENT Provider Note   CSN: [id number] Arrival date & time: 03/23/21  2024     History Chief Complaint  Patient presents with  . Chest Pain    Jacqueline Wyatt is a 75 y.o. female.  HPI  HPI: A 75 year old patient with a history of hypercholesterolemia presents for evaluation of chest pain. Initial onset of pain was approximately 1-3 hours ago. The patient's chest pain is sharp and is not worse with exertion. The patient reports some diaphoresis. The patient's chest pain is middle- or left-sided, is not well-localized, is not described as heaviness/pressure/tightness and does radiate to the arms/jaw/neck. The patient does not complain of nausea. The patient has no history of stroke, has no history of peripheral artery disease, has not smoked in the past 90 days, denies any history of treated diabetes, has no relevant family history of coronary artery disease (first degree relative at less than age 75), is not hypertensive and does not have an elevated BMI (>=30).   Past Medical History:  Diagnosis Date  . Allergy   . Arthritis   . Asthma   . Fibromyalgia   . Fibromyalgia muscle pain   . Neuromuscular disorder (HCC)   . Osteoporosis   . Thyroid disease     Patient Active Problem List   Diagnosis Date Noted  . NSTEMI (non-ST elevated myocardial infarction) (HCC) 03/24/2021  . Elevated troponin   . Stage 3a chronic kidney disease (HCC) 11/07/2019  . Dyslipidemia 11/07/2019  . Chronic pain syndrome 08/05/2017  . Vitamin D deficiency 08/05/2017  . Generalized anxiety disorder 09/09/2014  . Hypothyroidism 11/24/2011  . Migraine 11/24/2011  . Environmental allergies 11/24/2011  . Fibromyalgia 11/24/2011  . Irritable bowel syndrome (IBS) 11/24/2011  . Osteoporosis 11/24/2011    Past Surgical History:  Procedure Laterality Date  . LEFT HEART CATH AND CORONARY ANGIOGRAPHY N/A 03/24/2021   Procedure: LEFT HEART CATH AND CORONARY  ANGIOGRAPHY;  Surgeon: Corky Crafts, MD;  Location: [facility] INVASIVE CV LAB;  Service: Cardiovascular;  Laterality: N/A;     OB History   No obstetric history on file.     Family History  Problem Relation Age of Onset  . Heart disease Mother   . Hypertension Mother   . Hyperlipidemia Mother   . Hearing loss Mother   . Kidney disease Mother   . Vision loss Mother   . Lung disease Mother   . Stroke Father   . Arthritis Sister   . Meniere's disease Brother   . Cancer Maternal Grandmother   . Stroke Maternal Grandmother   . Heart disease Maternal Grandfather   . Heart disease Maternal Uncle     Social History   Tobacco Use  . Smoking status: Never Smoker  . Smokeless tobacco: Never Used    Home Medications Prior to Admission medications   Medication Sig Start Date End Date Taking? Authorizing Provider  aspirin 325 MG EC tablet Take 325 mg by mouth daily as needed for pain.   Yes [provider]  B Complex Vitamins (VITAMIN B COMPLEX PO) Take 1 tablet by mouth daily.   Yes [provider]  Coenzyme Q10 50 MG CHEW Chew 100 mg by mouth daily.   Yes [provider]  levothyroxine (SYNTHROID) 50 MCG tablet TAKE 1 TABLET BY MOUTH EVERY DAY BEFORE BREAKFAST Patient taking differently: Take 50 mcg by mouth daily before breakfast. 06/18/20  Yes Sagardia, Eilleen Kempf, MD  loratadine (CLARITIN) 10 MG tablet Take 10  mg by mouth daily.   Yes [provider]  traMADol (ULTRAM) 50 MG tablet Take 1 tablet (50 mg total) by mouth 2 (two) times daily. As needed. Patient taking differently: Take 25 mg by mouth 2 (two) times daily. 11/17/20  Yes Just, Azalee Course, FNP  Vitamin D, Ergocalciferol, (DRISDOL) 1.25 MG (50000 UNIT) CAPS capsule Take 1 capsule (50,000 Units total) by mouth every 7 (seven) days. 11/17/20  Yes Just, Azalee Course, FNP  COVID-19 mRNA vaccine, Pfizer, 30 MCG/0.3ML injection INJECT AS DIRECTED 11/25/20 11/25/21  Judyann Munson, MD  rosuvastatin  (CRESTOR) 20 MG tablet Take 1 tablet (20 mg total) by mouth daily. Patient not taking: Reported on 03/23/2021 11/18/20   Just, Azalee Course, FNP    Allergies    Almotriptan malate, Banana, Eggs or egg-derived products, Imitrex [sumatriptan base], Penicillins, Relpax [eletriptan hydrobromide], Sulfa drugs cross reactors, Vantin, Watermelon flavor, and Azithromycin  Review of Systems   Review of Systems  All other systems reviewed and are negative.   Physical Exam Updated Vital Signs BP 112/75   Pulse 95   Temp 98.2 F (36.8 C) (Oral)   Resp 14   Ht 1.651 m (5\' 5" )   Wt 86.2 kg   SpO2 99%   BMI 31.62 kg/m   Physical Exam Vitals and nursing note reviewed.  Constitutional:      General: She is not in acute distress.    Appearance: She is well-developed.  HENT:     Head: Normocephalic and atraumatic.     Right Ear: External ear normal.     Left Ear: External ear normal.  Eyes:     General: No scleral icterus.       Right eye: No discharge.        Left eye: No discharge.     Conjunctiva/sclera: Conjunctivae normal.  Neck:     Trachea: No tracheal deviation.  Cardiovascular:     Rate and Rhythm: Normal rate and regular rhythm.  Pulmonary:     Effort: Pulmonary effort is normal. No respiratory distress.     Breath sounds: Normal breath sounds. No stridor. No wheezing or rales.  Chest:     Chest wall: Tenderness present. No deformity.  Abdominal:     General: Bowel sounds are normal. There is no distension.     Palpations: Abdomen is soft.     Tenderness: There is no abdominal tenderness. There is no guarding or rebound.  Musculoskeletal:        General: No tenderness.     Cervical back: Neck supple.  Skin:    General: Skin is warm and dry.     Findings: No rash.  Neurological:     Mental Status: She is alert.     Cranial Nerves: No cranial nerve deficit (no facial droop, extraocular movements intact, no slurred speech).     Sensory: No sensory deficit.     Motor: No  abnormal muscle tone or seizure activity.     Coordination: Coordination normal.     ED Results / Procedures / Treatments   Labs (all labs ordered are listed, but only abnormal results are displayed) Labs Reviewed  BASIC METABOLIC PANEL - Abnormal; Notable for the following components:      Result Value   Glucose, Bld 142 (*)    Creatinine, Ser 1.07 (*)    GFR, Estimated 55 (*)    All other components within normal limits  BASIC METABOLIC PANEL - Abnormal; Notable for the following components:  Potassium 3.4 (*)    Chloride 113 (*)    CO2 19 (*)    Calcium 7.5 (*)    All other components within normal limits  MAGNESIUM - Abnormal; Notable for the following components:   Magnesium 1.6 (*)    All other components within normal limits  TROPONIN I (HIGH SENSITIVITY) - Abnormal; Notable for the following components:   Troponin I (High Sensitivity) 18 (*)    All other components within normal limits  TROPONIN I (HIGH SENSITIVITY) - Abnormal; Notable for the following components:   Troponin I (High Sensitivity) 177 (*)    All other components within normal limits  TROPONIN I (HIGH SENSITIVITY) - Abnormal; Notable for the following components:   Troponin I (High Sensitivity) 1,465 (*)    All other components within normal limits  TROPONIN I (HIGH SENSITIVITY) - Abnormal; Notable for the following components:   Troponin I (High Sensitivity) 2,048 (*)    All other components within normal limits  SARS CORONAVIRUS 2 BY RT PCR (HOSPITAL ORDER, PERFORMED IN [facility])  SARS CORONAVIRUS 2 (TAT 6-24 HRS)  CBC WITH DIFFERENTIAL/PLATELET  HEPARIN LEVEL (UNFRACTIONATED)  LIPID PANEL  HEMOGLOBIN A1C    EKG EKG Interpretation  Date/Time:  Monday Mar 23 2021 20:44:39 EDT Ventricular Rate:  75 PR Interval:  132 QRS Duration: 76 QT Interval:  364 QTC Calculation: 406 R Axis:   3 Text Interpretation: Normal sinus rhythm Inferior infarct , age undetermined Possible  Anterolateral infarct , age undetermined Abnormal ECG No previous tracing Confirmed by Linwood Dibbles [phone]) on 03/23/2021 9:18:02 PM   Radiology DG Chest 2 View  Result Date: 03/23/2021 CLINICAL DATA:  75 year old female with chest pain. EXAM: CHEST - 2 VIEW COMPARISON:  None. FINDINGS: The heart size and mediastinal contours are within normal limits. Both lungs are clear. The visualized skeletal structures are unremarkable. IMPRESSION: No active cardiopulmonary disease. Electronically Signed   By: Elgie Collard M.D.   On: 03/23/2021 21:44   CARDIAC CATHETERIZATION  Result Date: 03/24/2021  The left ventricular systolic function is normal.  LV end diastolic pressure is normal.  The left ventricular ejection fraction is 55-65% by visual estimate.  There is no aortic valve stenosis.  No angiographically apparent CAD.  Consider myocarditis as etiology of elevated troponin.  Consider cardiac MRI.    Procedures Procedures   Medications Ordered in ED Medications  nitroGLYCERIN (NITROSTAT) SL tablet 0.4 mg (has no administration in time range)  aspirin EC tablet 81 mg (has no administration in time range)  levothyroxine (SYNTHROID) tablet 50 mcg (50 mcg Oral Given 03/24/21 0604)  rosuvastatin (CRESTOR) tablet 20 mg (20 mg Oral Given 03/24/21 0918)  metoprolol tartrate (LOPRESSOR) tablet 12.5 mg (12.5 mg Oral Given 03/24/21 0914)  traMADol (ULTRAM) tablet 50 mg (50 mg Oral Given 03/24/21 1455)  loratadine (CLARITIN) tablet 10 mg (10 mg Oral Given 03/24/21 0917)  nitroGLYCERIN 50 mg in dextrose 5 % 250 mL (0.2 mg/mL) infusion (0 mcg/min Intravenous Stopped 03/24/21 1107)  sodium chloride flush (NS) 0.9 % injection 3 mL (3 mLs Intravenous Given 03/24/21 0926)  sodium chloride flush (NS) 0.9 % injection 3 mL (has no administration in time range)  0.9 %  sodium chloride infusion (has no administration in time range)  0.9% sodium chloride infusion (has no administration in time range)    Followed by   0.9% sodium chloride infusion (has no administration in time range)  labetalol (NORMODYNE) injection 10 mg (has no administration in time  range)  hydrALAZINE (APRESOLINE) injection 10 mg (has no administration in time range)  acetaminophen (TYLENOL) tablet 650 mg (has no administration in time range)  ondansetron (ZOFRAN) injection 4 mg (has no administration in time range)  0.9 %  sodium chloride infusion (has no administration in time range)  sodium chloride flush (NS) 0.9 % injection 3 mL (has no administration in time range)  sodium chloride flush (NS) 0.9 % injection 3 mL (has no administration in time range)  0.9 %  sodium chloride infusion (has no administration in time range)  aspirin chewable tablet 324 mg (324 mg Oral Given 03/24/21 0047)  morphine 4 MG/ML injection 4 mg (4 mg Intravenous Given 03/23/21 2210)  heparin bolus via infusion 4,000 Units (4,000 Units Intravenous Bolus from Bag 03/24/21 0109)  nitroGLYCERIN (NITROGLYN) 2 % ointment 0.5 inch (0.5 inches Topical Given 03/24/21 0054)  sodium chloride 0.9 % bolus 250 mL (250 mLs Intravenous New Bag/Given 03/24/21 0741)  potassium chloride SA (KLOR-CON) CR tablet 20 mEq (20 mEq Oral Given 03/24/21 0912)  magnesium sulfate IVPB 2 g 50 mL (2 g Intravenous New Bag/Given 03/24/21 [id number])    ED Course  I have reviewed the triage vital signs and the nursing notes.  Pertinent labs & imaging results that were available during my care of the patient were reviewed by me and considered in my medical decision making (see chart for details).  Clinical Course as of 03/24/21 1511  Mon Mar 23, 2021  2252 Patient's initial troponin slightly increased at 18. [JK]    Clinical Course User Index [JK] Linwood DibblesKnapp, Jaylen Knope, MD   MDM Rules/Calculators/A&P HEAR Score: 5                        Pt presented with chest pain.  Moderate risk heart score.  Initial troponin elevated.  Presentation concerning for ACS.  Pain resolved after initial ED treamtent.  ASA,  NTG, morphine.  Anticipate admission.  Will wait on delta troponin to determine cardiology vs medicine admission.  Care turned over to Dr Eudelia Bunchardama Final Clinical Impression(s) / ED Diagnoses Chest pain, unstable Bryson Haanginga   Huie Ghuman, MD 03/24/21 [phone]

## 2021-03-23 NOTE — ED Triage Notes (Signed)
Pt sudden onset of centralized chest pain at rest. Described as sore, radiating to back and left arm. Denies SOB, NV.

## 2021-03-24 ENCOUNTER — Encounter (HOSPITAL_COMMUNITY): Payer: Self-pay | Admitting: Interventional Cardiology

## 2021-03-24 ENCOUNTER — Encounter (HOSPITAL_COMMUNITY)
Admission: EM | Disposition: A | Discharge status: Home / Self Care | Payer: Self-pay | Source: Home / Self Care | Attending: Cardiology

## 2021-03-24 ENCOUNTER — Inpatient Hospital Stay (HOSPITAL_COMMUNITY): Payer: Medicare Other

## 2021-03-24 DIAGNOSIS — E039 Hypothyroidism, unspecified: Secondary | ICD-10-CM | POA: Diagnosis present

## 2021-03-24 DIAGNOSIS — R778 Other specified abnormalities of plasma proteins: Secondary | ICD-10-CM | POA: Diagnosis present

## 2021-03-24 DIAGNOSIS — Z7982 Long term (current) use of aspirin: Secondary | ICD-10-CM | POA: Diagnosis not present

## 2021-03-24 DIAGNOSIS — I514 Myocarditis, unspecified: Secondary | ICD-10-CM | POA: Diagnosis present

## 2021-03-24 DIAGNOSIS — I214 Non-ST elevation (NSTEMI) myocardial infarction: Secondary | ICD-10-CM

## 2021-03-24 DIAGNOSIS — R7989 Other specified abnormal findings of blood chemistry: Secondary | ICD-10-CM | POA: Insufficient documentation

## 2021-03-24 DIAGNOSIS — E7800 Pure hypercholesterolemia, unspecified: Secondary | ICD-10-CM | POA: Diagnosis present

## 2021-03-24 DIAGNOSIS — Z79890 Hormone replacement therapy (postmenopausal): Secondary | ICD-10-CM | POA: Diagnosis not present

## 2021-03-24 DIAGNOSIS — E785 Hyperlipidemia, unspecified: Secondary | ICD-10-CM

## 2021-03-24 DIAGNOSIS — Z20822 Contact with and (suspected) exposure to covid-19: Secondary | ICD-10-CM | POA: Diagnosis present

## 2021-03-24 DIAGNOSIS — M797 Fibromyalgia: Secondary | ICD-10-CM | POA: Diagnosis present

## 2021-03-24 DIAGNOSIS — Z79899 Other long term (current) drug therapy: Secondary | ICD-10-CM | POA: Diagnosis not present

## 2021-03-24 HISTORY — PX: LEFT HEART CATH AND CORONARY ANGIOGRAPHY: CATH118249

## 2021-03-24 LAB — ECHOCARDIOGRAM COMPLETE
Area-P 1/2: 3.21 cm2
Height: 65 in
S' Lateral: 2.6 cm
Weight: 3040 oz

## 2021-03-24 LAB — TROPONIN I (HIGH SENSITIVITY)
Troponin I (High Sensitivity): 177 ng/L (ref <18)
Troponin I (High Sensitivity): 1465 ng/L (ref <18)
Troponin I (High Sensitivity): 2048 ng/L (ref <18)

## 2021-03-24 LAB — BASIC METABOLIC PANEL
Anion gap: 8 (ref 5–15)
BUN: 12 mg/dL (ref 8–23)
CO2: 19 mmol/L — ABNORMAL LOW (ref 22–32)
Calcium: 7.5 mg/dL — ABNORMAL LOW (ref 8.9–10.3)
Chloride: 113 mmol/L — ABNORMAL HIGH (ref 98–111)
Creatinine, Ser: 0.82 mg/dL (ref 0.44–1.00)
GFR, Estimated: >60 mL/min (ref >60)
Glucose, Bld: 89 mg/dL (ref 70–99)
Potassium: 3.4 mmol/L — ABNORMAL LOW (ref 3.5–5.1)
Sodium: 140 mmol/L (ref 135–145)

## 2021-03-24 LAB — LIPID PANEL
Cholesterol: 161 mg/dL (ref 0–200)
HDL: 60 mg/dL (ref >40)
LDL Cholesterol: 82 mg/dL (ref 0–99)
Total CHOL/HDL Ratio: 2.7 RATIO
Triglycerides: 93 mg/dL (ref <150)
VLDL: 19 mg/dL (ref 0–40)

## 2021-03-24 LAB — SARS CORONAVIRUS 2 BY RT PCR (HOSPITAL ORDER, PERFORMED IN ~~LOC~~ HOSPITAL LAB): SARS Coronavirus 2: NEGATIVE

## 2021-03-24 LAB — GLUCOSE, CAPILLARY: Glucose-Capillary: 108 mg/dL — ABNORMAL HIGH (ref 70–99)

## 2021-03-24 LAB — HEPARIN LEVEL (UNFRACTIONATED): Heparin Unfractionated: 0.45 IU/mL (ref 0.30–0.70)

## 2021-03-24 LAB — MAGNESIUM: Magnesium: 1.6 mg/dL — ABNORMAL LOW (ref 1.7–2.4)

## 2021-03-24 SURGERY — LEFT HEART CATH AND CORONARY ANGIOGRAPHY
Anesthesia: LOCAL

## 2021-03-24 MED ORDER — ROSUVASTATIN CALCIUM 20 MG PO TABS
20.0000 mg | ORAL_TABLET | Freq: Every day | ORAL | Status: DC
  Administered 2021-03-24 – 2021-03-25 (×2): 20 mg via ORAL
  Filled 2021-03-24 (×2): qty 1

## 2021-03-24 MED ORDER — HEPARIN BOLUS VIA INFUSION
4000.0000 [IU] | Freq: Once | INTRAVENOUS | Status: AC
  Administered 2021-03-24: 4000 [IU] via INTRAVENOUS
  Filled 2021-03-24: qty 4000

## 2021-03-24 MED ORDER — LABETALOL HCL 5 MG/ML IV SOLN: 10.0000 mg | INTRAVENOUS | Status: AC | PRN

## 2021-03-24 MED ORDER — SODIUM CHLORIDE 0.9% FLUSH
3.0000 mL | Freq: Two times a day (BID) | INTRAVENOUS | Status: DC
  Administered 2021-03-24 – 2021-03-25 (×2): 3 mL via INTRAVENOUS

## 2021-03-24 MED ORDER — VERAPAMIL HCL 2.5 MG/ML IV SOLN
INTRAVENOUS | Status: AC
  Filled 2021-03-24: qty 2

## 2021-03-24 MED ORDER — SODIUM CHLORIDE 0.9 % IV SOLN: 250.0000 mL | INTRAVENOUS | Status: DC | PRN

## 2021-03-24 MED ORDER — SODIUM CHLORIDE 0.9 % IV BOLUS
250.0000 mL | Freq: Once | INTRAVENOUS | Status: AC
  Administered 2021-03-24: 250 mL via INTRAVENOUS

## 2021-03-24 MED ORDER — HEPARIN (PORCINE) IN NACL 1000-0.9 UT/500ML-% IV SOLN
INTRAVENOUS | Status: DC | PRN
  Administered 2021-03-24 (×2): 500 mL

## 2021-03-24 MED ORDER — ASPIRIN EC 81 MG PO TBEC
81.0000 mg | DELAYED_RELEASE_TABLET | Freq: Every day | ORAL | Status: DC | PRN
  Administered 2021-03-25: 81 mg via ORAL
  Filled 2021-03-24: qty 1

## 2021-03-24 MED ORDER — TRAMADOL HCL 50 MG PO TABS
50.0000 mg | ORAL_TABLET | Freq: Two times a day (BID) | ORAL | Status: DC | PRN
  Administered 2021-03-24: 50 mg via ORAL
  Filled 2021-03-24: qty 1

## 2021-03-24 MED ORDER — SODIUM CHLORIDE 0.9 % WEIGHT BASED INFUSION: 3.0000 mL/kg/h | INTRAVENOUS | Status: AC

## 2021-03-24 MED ORDER — COENZYME Q10 50 MG PO CHEW: 100.0000 mg | CHEWABLE_TABLET | Freq: Every day | ORAL | Status: DC

## 2021-03-24 MED ORDER — POTASSIUM CHLORIDE CRYS ER 20 MEQ PO TBCR
20.0000 meq | EXTENDED_RELEASE_TABLET | Freq: Once | ORAL | Status: AC
  Administered 2021-03-24: 20 meq via ORAL
  Filled 2021-03-24: qty 1

## 2021-03-24 MED ORDER — MAGNESIUM SULFATE 2 GM/50ML IV SOLN
2.0000 g | Freq: Once | INTRAVENOUS | Status: AC
  Administered 2021-03-24: 2 g via INTRAVENOUS
  Filled 2021-03-24: qty 50

## 2021-03-24 MED ORDER — HEPARIN (PORCINE) IN NACL 1000-0.9 UT/500ML-% IV SOLN
INTRAVENOUS | Status: AC
  Filled 2021-03-24: qty 500

## 2021-03-24 MED ORDER — HEPARIN SODIUM (PORCINE) 1000 UNIT/ML IJ SOLN
INTRAMUSCULAR | Status: AC
  Filled 2021-03-24: qty 1

## 2021-03-24 MED ORDER — LIDOCAINE HCL (PF) 1 % IJ SOLN
INTRAMUSCULAR | Status: DC | PRN
  Administered 2021-03-24: 2 mL

## 2021-03-24 MED ORDER — HEPARIN (PORCINE) 25000 UT/250ML-% IV SOLN
1000.0000 [IU]/h | INTRAVENOUS | Status: DC
  Administered 2021-03-24: 1000 [IU]/h via INTRAVENOUS
  Filled 2021-03-24: qty 250

## 2021-03-24 MED ORDER — SODIUM CHLORIDE 0.9% FLUSH: 3.0000 mL | INTRAVENOUS | Status: DC | PRN

## 2021-03-24 MED ORDER — MIDAZOLAM HCL 2 MG/2ML IJ SOLN
INTRAMUSCULAR | Status: DC | PRN
  Administered 2021-03-24: 2 mg via INTRAVENOUS

## 2021-03-24 MED ORDER — NITROGLYCERIN IN D5W 200-5 MCG/ML-% IV SOLN
0.0000 ug/min | INTRAVENOUS | Status: DC
  Administered 2021-03-24: 5 ug/min via INTRAVENOUS
  Filled 2021-03-24: qty 250

## 2021-03-24 MED ORDER — ONDANSETRON HCL 4 MG/2ML IJ SOLN: 4.0000 mg | Freq: Four times a day (QID) | INTRAMUSCULAR | Status: DC | PRN

## 2021-03-24 MED ORDER — LEVOTHYROXINE SODIUM 50 MCG PO TABS
50.0000 ug | ORAL_TABLET | Freq: Every day | ORAL | Status: DC
  Administered 2021-03-24 – 2021-03-25 (×2): 50 ug via ORAL
  Filled 2021-03-24 (×2): qty 1

## 2021-03-24 MED ORDER — HEPARIN SODIUM (PORCINE) 1000 UNIT/ML IJ SOLN
INTRAMUSCULAR | Status: DC | PRN
  Administered 2021-03-24: 4500 [IU] via INTRAVENOUS

## 2021-03-24 MED ORDER — MIDAZOLAM HCL 2 MG/2ML IJ SOLN
INTRAMUSCULAR | Status: AC
  Filled 2021-03-24: qty 2

## 2021-03-24 MED ORDER — LORATADINE 10 MG PO TABS
10.0000 mg | ORAL_TABLET | Freq: Every day | ORAL | Status: DC
  Administered 2021-03-24 – 2021-03-25 (×2): 10 mg via ORAL
  Filled 2021-03-24 (×3): qty 1

## 2021-03-24 MED ORDER — ACETAMINOPHEN 325 MG PO TABS: 650.0000 mg | ORAL_TABLET | ORAL | Status: DC | PRN

## 2021-03-24 MED ORDER — SODIUM CHLORIDE 0.9 % WEIGHT BASED INFUSION: 1.0000 mL/kg/h | INTRAVENOUS | Status: DC

## 2021-03-24 MED ORDER — SODIUM CHLORIDE 0.9 % IV SOLN: INTRAVENOUS | Status: AC

## 2021-03-24 MED ORDER — LIDOCAINE HCL (PF) 1 % IJ SOLN
INTRAMUSCULAR | Status: AC
  Filled 2021-03-24: qty 30

## 2021-03-24 MED ORDER — METOPROLOL TARTRATE 12.5 MG HALF TABLET
12.5000 mg | ORAL_TABLET | Freq: Two times a day (BID) | ORAL | Status: DC
  Administered 2021-03-24 – 2021-03-25 (×3): 12.5 mg via ORAL
  Filled 2021-03-24 (×3): qty 1

## 2021-03-24 MED ORDER — HYDRALAZINE HCL 20 MG/ML IJ SOLN: 10.0000 mg | INTRAMUSCULAR | Status: AC | PRN

## 2021-03-24 MED ORDER — FENTANYL CITRATE (PF) 100 MCG/2ML IJ SOLN
INTRAMUSCULAR | Status: DC | PRN
  Administered 2021-03-24: 25 ug via INTRAVENOUS

## 2021-03-24 MED ORDER — SODIUM CHLORIDE 0.9% FLUSH
3.0000 mL | Freq: Two times a day (BID) | INTRAVENOUS | Status: DC
  Administered 2021-03-24 – 2021-03-25 (×3): 3 mL via INTRAVENOUS

## 2021-03-24 MED ORDER — VERAPAMIL HCL 2.5 MG/ML IV SOLN
INTRAVENOUS | Status: DC | PRN
  Administered 2021-03-24: 10 mL via INTRA_ARTERIAL

## 2021-03-24 MED ORDER — NITROGLYCERIN 2 % TD OINT
0.5000 [in_us] | TOPICAL_OINTMENT | Freq: Once | TRANSDERMAL | Status: AC
  Administered 2021-03-24: 0.5 [in_us] via TOPICAL
  Filled 2021-03-24: qty 1

## 2021-03-24 MED ORDER — FENTANYL CITRATE (PF) 100 MCG/2ML IJ SOLN
INTRAMUSCULAR | Status: AC
  Filled 2021-03-24: qty 2

## 2021-03-24 SURGICAL SUPPLY — 10 items
CATH 5FR JL3.5 JR4 ANG PIG MP (CATHETERS) ×1 IMPLANT
COVER SWIFTLINK CONNECTOR (BAG) ×1 IMPLANT
DEVICE RAD COMP TR BAND LRG (VASCULAR PRODUCTS) ×1 IMPLANT
GLIDESHEATH SLEND SS 6F .021 (SHEATH) ×1 IMPLANT
GUIDEWIRE INQWIRE 1.5J.035X260 (WIRE) IMPLANT
INQWIRE 1.5J .035X260CM (WIRE) ×2
KIT HEART LEFT (KITS) ×2 IMPLANT
PACK CARDIAC CATHETERIZATION (CUSTOM PROCEDURE TRAY) ×2 IMPLANT
TRANSDUCER W/STOPCOCK (MISCELLANEOUS) ×2 IMPLANT
TUBING CIL FLEX 10 FLL-RA (TUBING) ×2 IMPLANT

## 2021-03-24 NOTE — Interval H&P Note (Signed)
Cath Lab Visit (complete for each Cath Lab visit)  Clinical Evaluation Leading to the Procedure:   ACS: Yes.    Non-ACS:    Anginal Classification: CCS IV  Anti-ischemic medical therapy: Minimal Therapy (1 class of medications)  Non-Invasive Test Results: No non-invasive testing performed  Prior CABG: No previous CABG      History and Physical Interval Note:  03/24/2021 10:37 AM  Jacqueline Wyatt  has presented today for surgery, with the diagnosis of nstemi.  The various methods of treatment have been discussed with the patient and family. After consideration of risks, benefits and other options for treatment, the patient has consented to  Procedure(s): LEFT HEART CATH AND CORONARY ANGIOGRAPHY (N/A) as a surgical intervention.  The patient's history has been reviewed, patient examined, no change in status, stable for surgery.  I have reviewed the patient's chart and labs.  Questions were answered to the patient's satisfaction.     Lance Muss

## 2021-03-24 NOTE — H&P (View-Only) (Signed)
 Progress Note  Patient Name: Jacqueline Wyatt Date of Encounter: 03/24/2021  [facility] Cardiologist: None   Subjective   Still with some mild chest discomfort. On IV NTG and receiving NS bolus.   Inpatient Medications    Scheduled Meds: . levothyroxine  50 mcg Oral Q0600  . loratadine  10 mg Oral Daily  . metoprolol tartrate  12.5 mg Oral BID  . rosuvastatin  20 mg Oral Daily  . sodium chloride flush  3 mL Intravenous Q12H   Continuous Infusions: . sodium chloride    . sodium chloride     Followed by  . sodium chloride    . heparin 1,000 Units/hr (03/24/21 0109)  . nitroGLYCERIN 5 mcg/min (03/24/21 0747)   PRN Meds: sodium chloride, acetaminophen, aspirin, nitroGLYCERIN, ondansetron (ZOFRAN) IV, sodium chloride flush, traMADol   Vital Signs    Vitals:   03/24/21 0730 03/24/21 0743 03/24/21 0745 03/24/21 0800  BP: 113/67  123/62 115/72  Pulse: 93  94 97  Resp: 16  19 17  Temp:      SpO2: 97% 99% 99% 100%  Weight:      Height:       No intake or output data in the 24 hours ending 03/24/21 0810 Last 3 Weights 03/23/2021 11/17/2020 07/03/2020  Weight (lbs) 190 lb 195 lb 189 lb  Weight (kg) 86.183 kg 88.451 kg 85.73 kg      Telemetry    SR - Personally Reviewed  ECG    SR no acute ST changes - Personally Reviewed  Physical Exam   GEN: No acute distress.   Neck: No JVD Cardiac: RRR, soft systoic murmur, no rubs, or gallops.  Respiratory: Clear to auscultation bilaterally. GI: Soft, nontender, non-distended  MS: No edema; No deformity. Neuro:  Nonfocal  Psych: Normal affect   Labs    High Sensitivity Troponin:   Recent Labs  Lab 03/23/21 2038 03/23/21 2239 03/24/21 0342 03/24/21 0600  TROPONINIHS 18* 177* 1,465* 2,048*      Chemistry Recent Labs  Lab 03/23/21 2038 03/24/21 0600  NA 140 140  K 4.2 3.4*  CL 105 113*  CO2 27 19*  GLUCOSE 142* 89  BUN 16 12  CREATININE 1.07* 0.82  CALCIUM 9.1 7.5*  GFRNONAA 55* >60  ANIONGAP 8 8      Hematology Recent Labs  Lab 03/23/21 2038  WBC 5.5  RBC 4.38  HGB 13.9  HCT 42.5  MCV 97.0  MCH 31.7  MCHC 32.7  RDW 13.1  PLT 240    BNPNo results for input(s): BNP, PROBNP in the last 168 hours.   DDimer No results for input(s): DDIMER in the last 168 hours.   Radiology    DG Chest 2 View  Result Date: 03/23/2021 CLINICAL DATA:  75-year-old female with chest pain. EXAM: CHEST - 2 VIEW COMPARISON:  None. FINDINGS: The heart size and mediastinal contours are within normal limits. Both lungs are clear. The visualized skeletal structures are unremarkable. IMPRESSION: No active cardiopulmonary disease. Electronically Signed   By: Arash  Radparvar M.D.   On: 03/23/2021 21:44    Cardiac Studies   Echo and cath pending  Patient Profile     75 y.o. female with hypothyroidism, fibromyalgia, dyslipidemia, but without prior history of cardiovascular disease now with NSTEMI  Assessment & Plan    NSTEMI - Plan for LHC  Today and possible intervention.  Patient to be NPO except for meds - Cont heparin IV infusion protocol - Symptom management with   Nitro drip with continued discomfort with paste - Trend trop and serial ECGs troponin 1465 - TTE today - Aggressive risk factor modification with glycemic control, smoking cessation, and GDMT (to be started once more objective data obtained; statin, ACEi, beta-blocker)             - Lipid panel and A1c pending             - restart rosuvastatin 20mg  daily (patient had self-d/c'd to attempt lifestyle changes)             - start Parks dose metoprolol 12.5mg  BID  The patient understands that risks included but are not limited to stroke (1 in 1000), death (1 in 1000), kidney failure [usually temporary] (1 in 500), bleeding (1 in 200), allergic reaction [possibly serious] (1 in 200).     Dyslipidemia -Check lipid panel -Restart rosuvastatin 20 mg daily in the interim   Hypothyroidism - Cont home  Synthroid   Fibromyalgia -Continue tramadol every 12hrs as needed  For questions or updates, please contact [facility] Please consult [email or web address] for contact info under        Signed, , NP  03/24/2021, 8:10 AM   As above, patient seen and examined.  She presented with chest pain and is ruled in for non-ST elevation myocardial infarction.  She is presently pain-free.  Continue aspirin, heparin, beta-blocker and statin.  Plan for cardiac catheterization later today.  The risk and benefits including myocardial infarction, CVA and death discussed and she agrees to proceed. Echo for LV function. 03/26/2021

## 2021-03-24 NOTE — Progress Notes (Addendum)
Progress Note  Patient Name: Jacqueline Wyatt Date of Encounter: 03/24/2021  [facility] Cardiologist: None   Subjective   Still with some mild chest discomfort. On IV NTG and receiving NS bolus.   Inpatient Medications    Scheduled Meds: . levothyroxine  50 mcg Oral Q0600  . loratadine  10 mg Oral Daily  . metoprolol tartrate  12.5 mg Oral BID  . rosuvastatin  20 mg Oral Daily  . sodium chloride flush  3 mL Intravenous Q12H   Continuous Infusions: . sodium chloride    . sodium chloride     Followed by  . sodium chloride    . heparin 1,000 Units/hr (03/24/21 0109)  . nitroGLYCERIN 5 mcg/min (03/24/21 0747)   PRN Meds: sodium chloride, acetaminophen, aspirin, nitroGLYCERIN, ondansetron (ZOFRAN) IV, sodium chloride flush, traMADol   Vital Signs    Vitals:   03/24/21 0730 03/24/21 0743 03/24/21 0745 03/24/21 0800  BP: 113/67  123/62 115/72  Pulse: 93  94 97  Resp: 16  19 17   Temp:      SpO2: 97% 99% 99% 100%  Weight:      Height:       No intake or output data in the 24 hours ending 03/24/21 0810 Last 3 Weights 03/23/2021 11/17/2020 07/03/2020  Weight (lbs) 190 lb 195 lb 189 lb  Weight (kg) 86.183 kg 88.451 kg 85.73 kg      Telemetry    SR - Personally Reviewed  ECG    SR no acute ST changes - Personally Reviewed  Physical Exam   GEN: No acute distress.   Neck: No JVD Cardiac: RRR, soft systoic murmur, no rubs, or gallops.  Respiratory: Clear to auscultation bilaterally. GI: Soft, nontender, non-distended  MS: No edema; No deformity. Neuro:  Nonfocal  Psych: Normal affect   Labs    High Sensitivity Troponin:   Recent Labs  Lab 03/23/21 2038 03/23/21 2239 03/24/21 0342 03/24/21 0600  TROPONINIHS 18* 177* 1,465* 2,048*      Chemistry Recent Labs  Lab 03/23/21 2038 03/24/21 0600  NA 140 140  K 4.2 3.4*  CL 105 113*  CO2 27 19*  GLUCOSE 142* 89  BUN 16 12  CREATININE 1.07* 0.82  CALCIUM 9.1 7.5*  GFRNONAA 55* >60  ANIONGAP 8 8      Hematology Recent Labs  Lab 03/23/21 2038  WBC 5.5  RBC 4.38  HGB 13.9  HCT 42.5  MCV 97.0  MCH 31.7  MCHC 32.7  RDW 13.1  PLT 240    BNPNo results for input(s): BNP, PROBNP in the last 168 hours.   DDimer No results for input(s): DDIMER in the last 168 hours.   Radiology    DG Chest 2 View  Result Date: 03/23/2021 CLINICAL DATA:  75 year old female with chest pain. EXAM: CHEST - 2 VIEW COMPARISON:  None. FINDINGS: The heart size and mediastinal contours are within normal limits. Both lungs are clear. The visualized skeletal structures are unremarkable. IMPRESSION: No active cardiopulmonary disease. Electronically Signed   By: 66 M.D.   On: 03/23/2021 21:44    Cardiac Studies   Echo and cath pending  Patient Profile     75 y.o. female with hypothyroidism, fibromyalgia, dyslipidemia, but without prior history of cardiovascular disease now with NSTEMI  Assessment & Plan    NSTEMI - Plan for LHC  Today and possible intervention.  Patient to be NPO except for meds - Cont heparin IV infusion protocol - Symptom management with  Nitro drip with continued discomfort with paste - Trend trop and serial ECGs troponin 1465 - TTE today - Aggressive risk factor modification with glycemic control, smoking cessation, and GDMT (to be started once more objective data obtained; statin, ACEi, beta-blocker)             - Lipid panel and A1c pending             - restart rosuvastatin 20mg  daily (patient had self-d/c'd to attempt lifestyle changes)             - start Parks dose metoprolol 12.5mg  BID  The patient understands that risks included but are not limited to stroke (1 in 1000), death (1 in 1000), kidney failure [usually temporary] (1 in 500), bleeding (1 in 200), allergic reaction [possibly serious] (1 in 200).     Dyslipidemia -Check lipid panel -Restart rosuvastatin 20 mg daily in the interim   Hypothyroidism - Cont home  Synthroid   Fibromyalgia -Continue tramadol every 12hrs as needed  For questions or updates, please contact [facility] Please consult [email or web address] for contact info under        Signed, , NP  03/24/2021, 8:10 AM   As above, patient seen and examined.  She presented with chest pain and is ruled in for non-ST elevation myocardial infarction.  She is presently pain-free.  Continue aspirin, heparin, beta-blocker and statin.  Plan for cardiac catheterization later today.  The risk and benefits including myocardial infarction, CVA and death discussed and she agrees to proceed. Echo for LV function. 03/26/2021

## 2021-03-24 NOTE — Progress Notes (Signed)
ANTICOAGULATION CONSULT NOTE - Follow Up Consult  Pharmacy Consult for heparin Indication: chest pain/ACS  Allergies  Allergen Reactions  . Almotriptan Malate     Chest pain  . Banana   . Eggs Or Egg-Derived Products     Stomach pain  . Imitrex [Sumatriptan Base]   . Penicillins   . Relpax [Eletriptan Hydrobromide] Hypertension  . Sulfa Drugs Cross Reactors   . Vantin     dizziness  . Watermelon Flavor   . Azithromycin Anxiety    Patient Measurements: Height: 5\' 5"  (165.1 cm) Weight: 86.2 kg (190 lb) IBW/kg (Calculated) : 57 Heparin Dosing Weight: 75kg  Vital Signs: BP: 121/65 (05/31 0900) Pulse Rate: 93 (05/31 0900)  Labs: Recent Labs    03/23/21 2038 03/23/21 2239 03/24/21 0342 03/24/21 0600 03/24/21 0921  HGB 13.9  --   --   --   --   HCT 42.5  --   --   --   --   PLT 240  --   --   --   --   HEPARINUNFRC  --   --   --   --  0.45  CREATININE 1.07*  --   --  0.82  --   TROPONINIHS 18* 177* 1,465* 2,048*  --     Estimated Creatinine Clearance: 65.3 mL/min (by C-G formula based on SCr of 0.82 mg/dL).   Medical History: Past Medical History:  Diagnosis Date  . Allergy   . Arthritis   . Asthma   . Fibromyalgia   . Fibromyalgia muscle pain   . Neuromuscular disorder (HCC)   . Osteoporosis   . Thyroid disease     Assessment: 75yo female c/o centralized CP radiating to back and LUE, initial troponin mildly elevated and now rising. Pharmacy consulted to dose heparin. Patient going to [facility] this morning. Heparin level 0.45 on heparin 1000 units/hr is therapeutic.   Goal of Therapy:  Heparin level 0.3-0.7 units/ml Monitor platelets by anticoagulation protocol: Yes   Plan:  Continue heparin 1000 units/hr  Follow up need for heparin after cath.   [facility], PharmD Clinical Pharmacist  03/24/2021,10:40 AM

## 2021-03-24 NOTE — Progress Notes (Signed)
Troponin is now 1400, pt on IV heparin, will add IV NTG due to she just not feel comfortable.

## 2021-03-24 NOTE — ED Notes (Signed)
EKG taken, cardiologist paged due to pts troponin increasing to 1465. Pt denies any cp at this time pt reports discomfort.

## 2021-03-24 NOTE — Progress Notes (Signed)
ANTICOAGULATION CONSULT NOTE - Initial Consult  Pharmacy Consult for heparin Indication: chest pain/ACS  Allergies  Allergen Reactions  . Almotriptan Malate     Chest pain  . Banana   . Eggs Or Egg-Derived Products     Stomach pain  . Imitrex [Sumatriptan Base]   . Penicillins   . Relpax [Eletriptan Hydrobromide] Hypertension  . Sulfa Drugs Cross Reactors   . Vantin     dizziness  . Watermelon Flavor   . Azithromycin Anxiety    Patient Measurements: Height: 5\' 5"  (165.1 cm) Weight: 86.2 kg (190 lb) IBW/kg (Calculated) : 57 Heparin Dosing Weight: 75kg  Vital Signs: Temp: 98.9 F (37.2 C) (05/30 2040) BP: 144/78 (05/31 0000) Pulse Rate: 80 (05/31 0000)  Labs: Recent Labs    03/23/21 2038 03/23/21 2239  HGB 13.9  --   HCT 42.5  --   PLT 240  --   CREATININE 1.07*  --   TROPONINIHS 18* 177*    Estimated Creatinine Clearance: 50 mL/min (A) (by C-G formula based on SCr of 1.07 mg/dL (H)).   Medical History: Past Medical History:  Diagnosis Date  . Allergy   . Arthritis   . Asthma   . Fibromyalgia   . Fibromyalgia muscle pain   . Neuromuscular disorder (HCC)   . Osteoporosis   . Thyroid disease     Assessment: 75yo female c/o centralized CP radiating to back and LUE, initial troponin mildly elevated and now rising (18>177), to begin heparin.  Goal of Therapy:  Heparin level 0.3-0.7 units/ml Monitor platelets by anticoagulation protocol: Yes   Plan:  Heparin 4000 units IV bolus x1 followed by gtt at 1000 units/hr. Monitor heparin levels and CBC.  75yo, PharmD, BCPS  03/24/2021,12:45 AM

## 2021-03-24 NOTE — ED Provider Notes (Signed)
I assumed care of this patient.  Please see previous provider note for further details of Hx, PE.  Briefly patient is a 75 y.o. female who presented for atypical chest pain. EKG notable for T wave inversions in the inferior leads. Initial troponin slightly elevated. Currently awaiting second troponin.   Plan to admit to either medicine or cardiology depending on level of second Trop.  Second troponin at 177. Patient started on heparin drip for NSTEMI. Admitted to cardiology.  Marland Kitchen1-3 Lead EKG Interpretation Performed by: Nira Conn, MD Authorized by: Nira Conn, MD     Interpretation: normal     ECG rate:  78   ECG rate assessment: normal     Rhythm: sinus rhythm     Ectopy: none     Conduction: normal   .Critical Care Performed by: Nira Conn, MD Authorized by: Nira Conn, MD   Critical care provider statement:    Critical care time (minutes):  30   Critical care was necessary to treat or prevent imminent or life-threatening deterioration of the following conditions:  Cardiac failure   Critical care was time spent personally by me on the following activities:  Discussions with consultants, evaluation of patient's response to treatment, examination of patient, ordering and performing treatments and interventions, ordering and review of laboratory studies, ordering and review of radiographic studies, pulse oximetry, re-evaluation of patient's condition, obtaining history from patient or surrogate and review of old charts         Kenlie Seki, Amadeo Garnet, MD 03/24/21 0425

## 2021-03-24 NOTE — ED Notes (Addendum)
Notified [place], NP of patient's BP trending lower 103/ 42 MAP lower than 65. Verbal order given for 250 bolus and then to start Nitroglycerin gtt.

## 2021-03-24 NOTE — Plan of Care (Signed)

## 2021-03-24 NOTE — ED Notes (Addendum)
Informed md Croitoru of pts increase troponin to 1465

## 2021-03-24 NOTE — H&P (Signed)
Cardiology Admission History and Physical:   Patient ID: Jacqueline Wyatt MRN: [id number]; DOB: 03-16-1946   Admission date: 03/23/2021  PCP:  Georgina Quint, MD   [facility] Providers Cardiologist:  None        Chief Complaint:  Chest Pain  Patient Profile:   Jacqueline Wyatt is a 75 y.o. female with hypothyroidism, fibromyalgia, dyslipidemia, but without prior history of cardiovascular disease who is being seen 03/24/2021 for the evaluation of chest pain with associated elevated troponin concerning for an NSTEMI.  History of Present Illness:   [name] reports that she was in her usual state of health until approximately 1900 on 03/23/2021 when she developed sharp midsternal chest pain with radiation down her left arm and into her back.  She states that the pain was 7-8/10 in severity at its worse. She initially thought it was pain related to her fibromyalgia which she gets often, but states that this pain hurt significantly more than usual. She otherwise denied any dyspnea/tachypnea, nausea/vomiting orthopnea, irregular heart beat/palpitations, presyncope/syncope, or lower extremity edema. After about 5-10 minutes she suspected that something else was going on, she took full dose asprin, and decided that she would drive herself to the ED for evaluation.  In the [facility] ED, her initial vital signs showed that she was afebrile with a BP of 160/80, HR of 79, and satting 100% on room air. Her initial EKG did not show any dynamic ST-T changes. Her initial troponin was 18, but bumped to 177 on recheck two hours later. She was subsequently started on heparin gtt, morphine, and topical nitro. At the time of my interview, her chest pain level was down to 1-2/10 in severity.     Past Medical History:  Diagnosis Date  . Allergy   . Arthritis   . Asthma   . Fibromyalgia   . Fibromyalgia muscle pain   . Neuromuscular disorder (HCC)   . Osteoporosis   . Thyroid disease     No past surgical history  on file.   Medications Prior to Admission: Prior to Admission medications   Medication Sig Start Date End Date Taking? Authorizing Provider  aspirin 325 MG EC tablet Take 325 mg by mouth daily as needed for pain.   Yes [provider]  B Complex Vitamins (VITAMIN B COMPLEX PO) Take 1 tablet by mouth daily.   Yes [provider]  Coenzyme Q10 50 MG CHEW Chew 100 mg by mouth daily.   Yes [provider]  levothyroxine (SYNTHROID) 50 MCG tablet TAKE 1 TABLET BY MOUTH EVERY DAY BEFORE BREAKFAST Patient taking differently: Take 50 mcg by mouth daily before breakfast. 06/18/20  Yes Sagardia, Eilleen Kempf, MD  loratadine (CLARITIN) 10 MG tablet Take 10 mg by mouth daily.   Yes [provider]  traMADol (ULTRAM) 50 MG tablet Take 1 tablet (50 mg total) by mouth 2 (two) times daily. As needed. Patient taking differently: Take 25 mg by mouth 2 (two) times daily. 11/17/20  Yes Just, Azalee Course, FNP  Vitamin D, Ergocalciferol, (DRISDOL) 1.25 MG (50000 UNIT) CAPS capsule Take 1 capsule (50,000 Units total) by mouth every 7 (seven) days. 11/17/20  Yes Just, Azalee Course, FNP  COVID-19 mRNA vaccine, Pfizer, 30 MCG/0.3ML injection INJECT AS DIRECTED 11/25/20 11/25/21  Judyann Munson, MD  rosuvastatin (CRESTOR) 20 MG tablet Take 1 tablet (20 mg total) by mouth daily. Patient not taking: Reported on 03/23/2021 11/18/20   Just, Azalee Course, FNP     Allergies:  Allergies  Allergen Reactions  . Almotriptan Malate     Chest pain  . Banana   . Eggs Or Egg-Derived Products     Stomach pain  . Imitrex [Sumatriptan Base]   . Penicillins   . Relpax [Eletriptan Hydrobromide] Hypertension  . Sulfa Drugs Cross Reactors   . Vantin     dizziness  . Watermelon Flavor   . Azithromycin Anxiety    Social History:   Social History   Socioeconomic History  . Marital status: Single    Spouse name: Not on file  . Number of children: Not on file  . Years of education: Not on file  . Highest  education level: Not on file  Occupational History  . Not on file  Tobacco Use  . Smoking status: Never Smoker  . Smokeless tobacco: Never Used  Substance and Sexual Activity  . Alcohol use: Not on file  . Drug use: Not on file  . Sexual activity: Not on file  Other Topics Concern  . Not on file  Social History Narrative  . Not on file   Social Determinants of Health   Financial Resource Strain: Not on file  Food Insecurity: Not on file  Transportation Needs: Not on file  Physical Activity: Not on file  Stress: Not on file  Social Connections: Not on file  Intimate Partner Violence: Not on file    Family History:   The patient's family history includes Arthritis in her sister; Cancer in her maternal grandmother; Hearing loss in her mother; Heart disease in her maternal grandfather, maternal uncle, and mother; Hyperlipidemia in her mother; Hypertension in her mother; Kidney disease in her mother; Lung disease in her mother; Meniere's disease in her brother; Stroke in her father and maternal grandmother; Vision loss in her mother.    ROS:  Please see the history of present illness.  All other ROS reviewed and negative.     Physical Exam/Data:   Vitals:   03/23/21 2200 03/23/21 2300 03/24/21 0000 03/24/21 0130  BP: (!) 147/81 136/78 (!) 144/78 121/82  Pulse: 78 80 80 96  Resp: 20 17 15 14   Temp:      SpO2: 100% 100% 99% 99%  Weight:      Height:       No intake or output data in the 24 hours ending 03/24/21 0205 Last 3 Weights 03/23/2021 11/17/2020 07/03/2020  Weight (lbs) 190 lb 195 lb 189 lb  Weight (kg) 86.183 kg 88.451 kg 85.73 kg     Body mass index is 31.62 kg/m.  General:  Well nourished, well developed, moderately anxious appearing but not in acute distress HEENT: normal Neck: normal JVD Cardiac:  normal S1, S2; RRR; no murmur, rubs, gallops Lungs:  clear to auscultation bilaterally, no wheezing, rhonchi or rales  Abd: soft, nontender, no hepatomegaly  Ext:  no lower extremity edema Musculoskeletal:  No deformities, BUE and BLE strength normal and equal Skin: warm and dry  Neuro:  CNs 2-12 intact, no focal abnormalities noted Psych:  Normal affect      Laboratory Data:  High Sensitivity Troponin:   Recent Labs  Lab 03/23/21 2038 03/23/21 2239  TROPONINIHS 18* 177*      Chemistry Recent Labs  Lab 03/23/21 2038  NA 140  K 4.2  CL 105  CO2 27  GLUCOSE 142*  BUN 16  CREATININE 1.07*  CALCIUM 9.1  GFRNONAA 55*  ANIONGAP 8    No results for input(s): PROT, ALBUMIN, AST, ALT,  ALKPHOS, BILITOT in the last 168 hours. Hematology Recent Labs  Lab 03/23/21 2038  WBC 5.5  RBC 4.38  HGB 13.9  HCT 42.5  MCV 97.0  MCH 31.7  MCHC 32.7  RDW 13.1  PLT 240   BNPNo results for input(s): BNP, PROBNP in the last 168 hours.  DDimer No results for input(s): DDIMER in the last 168 hours.   Radiology/Studies:  DG Chest 2 View  Result Date: 03/23/2021 CLINICAL DATA:  75 year old female with chest pain. EXAM: CHEST - 2 VIEW COMPARISON:  None. FINDINGS: The heart size and mediastinal contours are within normal limits. Both lungs are clear. The visualized skeletal structures are unremarkable. IMPRESSION: No active cardiopulmonary disease. Electronically Signed   By: Elgie Collard M.D.   On: 03/23/2021 21:44     Assessment and Plan:   Jacqueline Wyatt is a 75 y.o. female with hypothyroidism, fibromyalgia, dyslipidemia, but without prior history of cardiovascular disease who is being seen 03/24/2021 for the evaluation of chest pain with associated elevated troponin concerning for an NSTEMI.  NSTEMI - Plan for diagnostic LHC in AM - Patient to be NPO except for meds - Cont heparin IV infusion protocol - Symptom management with Nitro patch - Trend trop and serial ECGs - TTE in AM - Aggressive risk factor modification with glycemic control, smoking cessation, and GDMT (to be started once more objective data obtained; statin, ACEi,  beta-blocker)             - Lipid panel and A1c pending  - restart rosuvastatin 20mg  daily (patient had self-d/c'd to attempt lifestyle changes)  - start Froning dose metoprolol 12.5mg  BID   Dyslipidemia -Check lipid panel -Restart rosuvastatin 20 mg daily in the interim   Hypothyroidism - Cont home Synthroid   Fibromyalgia -Continue tramadol every 12hrs as needed  Risk Assessment/Risk Scores:    TIMI Risk Score for Unstable Angina or Non-ST Elevation MI:   The patient's TIMI risk score is 4, which indicates a 20% risk of all cause mortality, new or recurrent myocardial infarction or need for urgent revascularization in the next 14 days.       Severity of Illness: The appropriate patient status for this patient is INPATIENT. Inpatient status is judged to be reasonable and necessary in order to provide the required intensity of service to ensure the patient's safety. The patient's presenting symptoms, physical exam findings, and initial radiographic and laboratory data in the context of their chronic comorbidities is felt to place them at high risk for further clinical deterioration. Furthermore, it is not anticipated that the patient will be medically stable for discharge from the hospital within 2 midnights of admission. The following factors support the patient status of inpatient.   " The patient's presenting symptoms include chest pain. " The worrisome physical exam findings include none. " The initial radiographic and laboratory data are worrisome because of elevated troponin. " The chronic co-morbidities include dyslipidemia.   * I certify that at the point of admission it is my clinical judgment that the patient will require inpatient hospital care spanning beyond 2 midnights from the point of admission due to high intensity of service, high risk for further deterioration and high frequency of surveillance required.*    For questions or updates, please contact [facility] Please consult [email or web address] for contact info under     Signed, , MD  03/24/2021 2:05 AM

## 2021-03-24 NOTE — Progress Notes (Signed)
  Echocardiogram 2D Echocardiogram has been performed.  Jacqueline Wyatt 03/24/2021, 2:22 PM

## 2021-03-25 ENCOUNTER — Encounter (HOSPITAL_COMMUNITY): Payer: Self-pay | Admitting: Internal Medicine

## 2021-03-25 DIAGNOSIS — I214 Non-ST elevation (NSTEMI) myocardial infarction: Secondary | ICD-10-CM

## 2021-03-25 DIAGNOSIS — R079 Chest pain, unspecified: Secondary | ICD-10-CM

## 2021-03-25 HISTORY — DX: Chest pain, unspecified: R07.9

## 2021-03-25 LAB — HEMOGLOBIN A1C
Hgb A1c MFr Bld: 5.7 % — ABNORMAL HIGH (ref 4.8–5.6)
Mean Plasma Glucose: 117 mg/dL

## 2021-03-25 LAB — CBC
HCT: 37.1 % (ref 36.0–46.0)
Hemoglobin: 12.8 g/dL (ref 12.0–15.0)
MCH: 33 pg (ref 26.0–34.0)
MCHC: 34.5 g/dL (ref 30.0–36.0)
MCV: 95.6 fL (ref 80.0–100.0)
Platelets: 197 10*3/uL (ref 150–400)
RBC: 3.88 MIL/uL (ref 3.87–5.11)
RDW: 13.3 % (ref 11.5–15.5)
WBC: 7.6 10*3/uL (ref 4.0–10.5)
nRBC: 0 % (ref 0.0–0.2)

## 2021-03-25 MED ORDER — ASPIRIN 81 MG PO TBEC: 81.0000 mg | DELAYED_RELEASE_TABLET | Freq: Every day | ORAL | 11 refills | Status: DC

## 2021-03-25 NOTE — Progress Notes (Signed)
Progress Note  Patient Name: Jacqueline Wyatt Date of Encounter: 03/25/2021  [facility] Cardiologist: New  Subjective   No CP or dyspnea  Inpatient Medications    Scheduled Meds: . levothyroxine  50 mcg Oral Q0600  . loratadine  10 mg Oral Daily  . metoprolol tartrate  12.5 mg Oral BID  . rosuvastatin  20 mg Oral Daily  . sodium chloride flush  3 mL Intravenous Q12H  . sodium chloride flush  3 mL Intravenous Q12H   Continuous Infusions: . sodium chloride    . sodium chloride    . sodium chloride Stopped (03/24/21 1613)  . nitroGLYCERIN Stopped (03/24/21 1107)   PRN Meds: sodium chloride, sodium chloride, acetaminophen, aspirin, nitroGLYCERIN, ondansetron (ZOFRAN) IV, sodium chloride flush, sodium chloride flush, traMADol   Vital Signs    Vitals:   03/24/21 2300 03/25/21 0300 03/25/21 0400 03/25/21 0728  BP: 116/74 123/73 123/81 (!) 158/74  Pulse: 73 74 71 68  Resp: 20 (!) 34 18 16  Temp: 98.2 F (36.8 C) 97.9 F (36.6 C)  98.4 F (36.9 C)  TempSrc: Oral Oral  Oral  SpO2: 100% 97% 96% 97%  Weight:  85.9 kg    Height:        Intake/Output Summary (Last 24 hours) at 03/25/2021 1155 Last data filed at 03/25/2021 0634 Gross per 24 hour  Intake 250 ml  Output --  Net 250 ml   Last 3 Weights 03/25/2021 03/23/2021 11/17/2020  Weight (lbs) 189 lb 6 oz 190 lb 195 lb  Weight (kg) 85.9 kg 86.183 kg 88.451 kg      Telemetry    Sinus - Personally Reviewed   Physical Exam   GEN: No acute distress.   Neck: No JVD Cardiac: RRR, no murmurs, rubs, or gallops.  Respiratory: Clear to auscultation bilaterally. GI: Soft, nontender, non-distended  MS: No edema; radial cath site with no hematoma. Neuro:  Nonfocal  Psych: Normal affect   Labs    High Sensitivity Troponin:   Recent Labs  Lab 03/23/21 2038 03/23/21 2239 03/24/21 0342 03/24/21 0600  TROPONINIHS 18* 177* 1,465* 2,048*      Chemistry Recent Labs  Lab 03/23/21 2038 03/24/21 0600  NA 140 140  K 4.2  3.4*  CL 105 113*  CO2 27 19*  GLUCOSE 142* 89  BUN 16 12  CREATININE 1.07* 0.82  CALCIUM 9.1 7.5*  GFRNONAA 55* >60  ANIONGAP 8 8     Hematology Recent Labs  Lab 03/23/21 2038 03/25/21 0048  WBC 5.5 7.6  RBC 4.38 3.88  HGB 13.9 12.8  HCT 42.5 37.1  MCV 97.0 95.6  MCH 31.7 33.0  MCHC 32.7 34.5  RDW 13.1 13.3  PLT 240 197    Radiology    DG Chest 2 View  Result Date: 03/23/2021 CLINICAL DATA:  75 year old female with chest pain. EXAM: CHEST - 2 VIEW COMPARISON:  None. FINDINGS: The heart size and mediastinal contours are within normal limits. Both lungs are clear. The visualized skeletal structures are unremarkable. IMPRESSION: No active cardiopulmonary disease. Electronically Signed   By: Elgie Collard M.D.   On: 03/23/2021 21:44   CARDIAC CATHETERIZATION  Result Date: 03/24/2021  The left ventricular systolic function is normal.  LV end diastolic pressure is normal.  The left ventricular ejection fraction is 55-65% by visual estimate.  There is no aortic valve stenosis.  No angiographically apparent CAD.  Consider myocarditis as etiology of elevated troponin.  Consider cardiac MRI.    Patient  Profile     75 y.o. female with past medical history of hyperlipidemia, fibromyalgia, hypothyroidism admitted with non-ST elevation myocardial infarction.  Cardiac catheterization revealed normal coronaries.  Echocardiogram showed normal LV function.  Assessment & Plan    1 chest pain-patient's troponins were elevated.  However cardiac catheterization revealed normal coronaries and echocardiogram showed normal LV function.  She has had no recurrent symptoms.  We will treat with aspirin 81 mg daily.  2 hyperlipidemia-continue statin.  3 fibromyalgia-follow-up primary care.  Plan discharge today.  We will arrange follow-up with APP in 2 to 4 weeks.  If she is doing well at that time will likely not require further cardiac follow-up.  She does need close follow-up with her  primary care.  Greater than 30 minutes PA and physician time. D2  For questions or updates, please contact [facility] Please consult [email or web address] for contact info under        Signed, Olga Millers, MD  03/25/2021, 11:55 AM

## 2021-03-25 NOTE — Discharge Instructions (Signed)

## 2021-03-25 NOTE — Discharge Summary (Signed)
Discharge Summary    Patient ID: Jacqueline Wyatt MRN: [id number]; DOB: [birth year]  Admit date: 03/23/2021 Discharge date: 03/25/2021  PCP:  Georgina Quint, MD   [facility] Providers Cardiologist:  Olga Millers, MD   Discharge Diagnoses    Principal Problem:   Chest pain of uncertain etiology Active Problems:   Elevated troponin level not due myocardial infarction    Diagnostic Studies/Procedures    ECHO: 03/25/2021 EF normal  CARDIAC CATH: 03/24/2021  The left ventricular systolic function is normal.  LV end diastolic pressure is normal.  The left ventricular ejection fraction is 55-65% by visual estimate.  There is no aortic valve stenosis.  No angiographically apparent CAD.   Consider myocarditis as etiology of elevated troponin.  Consider cardiac MRI.   _____________   History of Present Illness     Jacqueline Wyatt is a 75 y.o. female with past medical history of hyperlipidemia, fibromyalgia, hypothyroidism admitted 05/31 with non-ST elevation myocardial infarction.  Hospital Course     Consultants: None   Her chest pain was sharp, her ECG was without acute ischemic changes.   Initial troponin 18 >> but increased rapidly to peak 2,048. She was on ASA, heparin and nitro, statin was restarted and BB added.  Cardiac cath was indicated, she was taken to the lab.  Results are above, no CAD, no evidence of dissection.   Full Echo results are pending, but preliminary results show a normal EF.   On 06/01, she was seen by Dr Jens Som and all data were reviewed. Her chest pain had resolved. No cardiac abnormalities on testing. Add ASA 81 mg daily and resume statin.   No further cardiac workup is indicated and she is considered stable for discharge, to follow up as an outpatient.  Did the patient have an acute coronary syndrome (MI, NSTEMI, STEMI, etc) this admission?:  No                               Did the patient have a percutaneous coronary  intervention (stent / angioplasty)?:  No.       _____________  Discharge Vitals Blood pressure (!) 145/81, pulse 64, temperature 98.4 F (36.9 C), temperature source Oral, resp. rate 16, height 5\' 5"  (1.651 m), weight 85.9 kg, SpO2 95 %.  Filed Weights   03/23/21 2036 03/25/21 0300  Weight: 86.2 kg 85.9 kg    Labs & Radiologic Studies    CBC Recent Labs    03/23/21 2038 03/25/21 0048  WBC 5.5 7.6  NEUTROABS 1.9  --   HGB 13.9 12.8  HCT 42.5 37.1  MCV 97.0 95.6  PLT 240 197   Basic Metabolic Panel Recent Labs    05/25/21 2038 03/24/21 0600  NA 140 140  K 4.2 3.4*  CL 105 113*  CO2 27 19*  GLUCOSE 142* 89  BUN 16 12  CREATININE 1.07* 0.82  CALCIUM 9.1 7.5*  MG  --  1.6*   Liver Function Tests No results for input(s): AST, ALT, ALKPHOS, BILITOT, PROT, ALBUMIN in the last 72 hours. No results for input(s): LIPASE, AMYLASE in the last 72 hours. High Sensitivity Troponin:   Recent Labs  Lab 03/23/21 2038 03/23/21 2239 03/24/21 0342 03/24/21 0600  TROPONINIHS 18* 177* 1,465* 2,048*    BNP Invalid input(s): POCBNP D-Dimer No results for input(s): DDIMER in the last 72 hours. Hemoglobin A1C Recent Labs    03/24/21 0528  HGBA1C 5.7*  Fasting Lipid Panel Recent Labs    03/24/21 0426  CHOL 161  HDL 60  LDLCALC 82  TRIG 93  CHOLHDL 2.7   Thyroid Function Tests No results for input(s): TSH, T4TOTAL, T3FREE, THYROIDAB in the last 72 hours.  Invalid input(s): FREET3 _____________  DG Chest 2 View  Result Date: 03/23/2021 CLINICAL DATA:  75 year old female with chest pain. EXAM: CHEST - 2 VIEW COMPARISON:  None. FINDINGS: The heart size and mediastinal contours are within normal limits. Both lungs are clear. The visualized skeletal structures are unremarkable. IMPRESSION: No active cardiopulmonary disease. Electronically Signed   By: Elgie Collard M.D.   On: 03/23/2021 21:44   CARDIAC CATHETERIZATION  Result Date: 03/24/2021  The left  ventricular systolic function is normal.  LV end diastolic pressure is normal.  The left ventricular ejection fraction is 55-65% by visual estimate.  There is no aortic valve stenosis.  No angiographically apparent CAD.  Consider myocarditis as etiology of elevated troponin.  Consider cardiac MRI.   Disposition   Pt is being discharged home today in good condition.  Follow-up Plans & Appointments     Follow-up Information    Lewayne Bunting, MD Follow up.   Specialty: Cardiology Contact information: [address] [place] [address] [phone]              Discharge Instructions    Diet - Kushnir sodium heart healthy   Complete by: As directed    Increase activity slowly   Complete by: As directed       Discharge Medications   Allergies as of 03/25/2021      Reactions   Almotriptan Malate    Chest pain   Banana    Eggs Or Egg-derived Products    Stomach pain   Imitrex [sumatriptan Base]    Penicillins    Relpax [eletriptan Hydrobromide] Hypertension   Sulfa Drugs Cross Reactors    Vantin    dizziness   Watermelon Flavor    Azithromycin Anxiety      Medication List    TAKE these medications   aspirin 325 MG EC tablet Take 325 mg by mouth daily as needed for pain. What changed: Another medication with the same name was added. Make sure you understand how and when to take each.   aspirin 81 MG EC tablet Take 1 tablet (81 mg total) by mouth daily. Swallow whole. What changed: You were already taking a medication with the same name, and this prescription was added. Make sure you understand how and when to take each.   Coenzyme Q10 50 MG Chew Chew 100 mg by mouth daily.   levothyroxine 50 MCG tablet Commonly known as: SYNTHROID TAKE 1 TABLET BY MOUTH EVERY DAY BEFORE BREAKFAST What changed: See the new instructions.   loratadine 10 MG tablet Commonly known as: CLARITIN Take 10 mg by mouth daily.   Pfizer-BioNTech COVID-19 Vacc 30  MCG/0.3ML injection Generic drug: COVID-19 mRNA vaccine (Pfizer) INJECT AS DIRECTED   rosuvastatin 20 MG tablet Commonly known as: Crestor Take 1 tablet (20 mg total) by mouth daily.   traMADol 50 MG tablet Commonly known as: ULTRAM Take 1 tablet (50 mg total) by mouth 2 (two) times daily. As needed. What changed:   how much to take  additional instructions   VITAMIN B COMPLEX PO Take 1 tablet by mouth daily.   Vitamin D (Ergocalciferol) 1.25 MG (50000 UNIT) Caps capsule Commonly known as: DRISDOL Take 1 capsule (50,000 Units total)  by mouth every 7 (seven) days.          Outstanding Labs/Studies   Final echo report.  Duration of Discharge Encounter   Greater than 30 minutes including physician time.  Signed, Theodore Demark, PA-C 03/25/2021, 1:06 PM

## 2021-04-06 ENCOUNTER — Encounter: Payer: Self-pay | Admitting: *Deleted

## 2021-04-20 ENCOUNTER — Ambulatory Visit (INDEPENDENT_AMBULATORY_CARE_PROVIDER_SITE_OTHER): Payer: Medicare Other | Admitting: Physician Assistant

## 2021-04-20 ENCOUNTER — Other Ambulatory Visit: Payer: Self-pay

## 2021-04-20 ENCOUNTER — Encounter: Payer: Self-pay | Admitting: Physician Assistant

## 2021-04-20 VITALS — BP 140/88 | HR 106 | Ht 65.0 in | Wt 191.8 lb

## 2021-04-20 DIAGNOSIS — R778 Other specified abnormalities of plasma proteins: Secondary | ICD-10-CM

## 2021-04-20 DIAGNOSIS — E785 Hyperlipidemia, unspecified: Secondary | ICD-10-CM | POA: Diagnosis not present

## 2021-04-20 DIAGNOSIS — E039 Hypothyroidism, unspecified: Secondary | ICD-10-CM | POA: Diagnosis not present

## 2021-04-20 NOTE — Progress Notes (Signed)
Cardiology Office Note:    Date:  04/22/2021   ID:  Jacqueline Wyatt, DOB [birth year], MRN [id number]  PCP:  Georgina Quint, MD   [facility] Providers Cardiologist:  Olga Millers, MD     Referring MD: Georgina Quint, *   Chief Complaint  Patient presents with   Follow-up    Seen for Dr. Jens Som    History of Present Illness:    Jacqueline Wyatt is a 75 y.o. female with a hx of hypothyroidism, fibromyalgia, hyperlipidemia who was recently admitted to the hospital in May 2022 with chest pain and found to have elevated troponin consistent with NSTEMI.  High-sensitivity serial troponin was initially 18 however gradually trended up to 2048.  Cardiac catheterization performed on 03/24/2021 showed no angiographically apparent CAD, EF 55 to 65%.  Echocardiogram obtained on the same day showed EF 60 to 65%, mild LVH, RVSP 39.4 mmHg, no significant valve issue.  Postprocedure, patient was placed on aspirin and statin.  It was recommended for the patient to follow-up with cardiology service as outpatient and if she is doing well, no further cardiac work-up is recommended.  Patient presents today for follow-up.  Since discharge, she has not had any further chest discomfort.  She says the chest discomfort started prior to the hospitalization when she has to run to the middle of the road to rescue a kitten.  She was quite stressed due to this event.  Her chest pain has resolved since.  She is on aspirin.  She did fill the prescription for Crestor however has not started on the Crestor therapy.  She wished to give diet and exercise a try.  She is seeing her PCP in 1 month and at which time she is planning to have a repeat fasting lipid panel.  If cholesterol still uncontrolled, then she will start on the Crestor.  Otherwise, she is not complaining of any discomfort at this time and can follow-up with Dr. Jens Som in 6 months   Past Medical History:  Diagnosis Date   Allergy    Arthritis     Asthma    Chest pain of uncertain etiology 03/25/2021   Fibromyalgia    Fibromyalgia muscle pain    Neuromuscular disorder (HCC)    Osteoporosis    Thyroid disease     Past Surgical History:  Procedure Laterality Date   LEFT HEART CATH AND CORONARY ANGIOGRAPHY N/A 03/24/2021   Procedure: LEFT HEART CATH AND CORONARY ANGIOGRAPHY;  Surgeon: Corky Crafts, MD;  Location: [facility] INVAS[facility];  Service: Cardiovascular;  Laterality: N/A;    Current Medications: Current Meds  Medication Sig   aspirin 325 MG EC tablet Take 325 mg by mouth daily as needed for pain.   aspirin EC 81 MG EC tablet Take 1 tablet (81 mg total) by mouth daily. Swallow whole.   B Complex Vitamins (VITAMIN B COMPLEX PO) Take 1 tablet by mouth daily.   Coenzyme Q10 50 MG CHEW Chew 100 mg by mouth daily.   COVID-19 mRNA vaccine, Pfizer, 30 MCG/0.3ML injection INJECT AS DIRECTED   levothyroxine (SYNTHROID) 50 MCG tablet TAKE 1 TABLET BY MOUTH EVERY DAY BEFORE BREAKFAST   loratadine (CLARITIN) 10 MG tablet Take 10 mg by mouth daily.   rosuvastatin (CRESTOR) 20 MG tablet Take 1 tablet (20 mg total) by mouth daily.   traMADol (ULTRAM) 50 MG tablet Take 1 tablet (50 mg total) by mouth 2 (two) times daily. As needed. (Patient taking differently: Take 25 mg by  mouth 2 (two) times daily.)   Vitamin D, Ergocalciferol, (DRISDOL) 1.25 MG (50000 UNIT) CAPS capsule Take 1 capsule (50,000 Units total) by mouth every 7 (seven) days.     Allergies:   Almotriptan malate, Banana, Eggs or egg-derived products, Imitrex [sumatriptan base], Penicillins, Relpax [eletriptan hydrobromide], Sulfa drugs cross reactors, Vantin, Watermelon flavor, and Azithromycin   Social History   Socioeconomic History   Marital status: Single    Spouse name: Not on file   Number of children: Not on file   Years of education: Not on file   Highest education level: Not on file  Occupational History   Not on file  Tobacco Use   Smoking status: Never    Smokeless tobacco: Never  Substance and Sexual Activity   Alcohol use: Not on file   Drug use: Not on file   Sexual activity: Not on file  Other Topics Concern   Not on file  Social History Narrative   Not on file   Social Determinants of Health   Financial Resource Strain: Not on file  Food Insecurity: Not on file  Transportation Needs: Not on file  Physical Activity: Not on file  Stress: Not on file  Social Connections: Not on file     Family History: The patient's family history includes Arthritis in her sister; Cancer in her maternal grandmother; Hearing loss in her mother; Heart disease in her maternal grandfather, maternal uncle, and mother; Hyperlipidemia in her mother; Hypertension in her mother; Kidney disease in her mother; Lung disease in her mother; Meniere's disease in her brother; Stroke in her father and maternal grandmother; Vision loss in her mother.  ROS:   Please see the history of present illness.     All other systems reviewed and are negative.  EKGs/Labs/Other Studies Reviewed:    The following studies were reviewed today:  Echo 03/24/2021 1. Left ventricular ejection fraction, by estimation, is 60 to 65%. The  left ventricle has normal function. The left ventricle has no regional  wall motion abnormalities. There is mild left ventricular hypertrophy.  Left ventricular diastolic parameters  are indeterminate.   2. Right ventricular systolic function is normal. The right ventricular  size is normal. There is mildly elevated pulmonary artery systolic  pressure. The estimated right ventricular systolic pressure is 39.4 mmHg.   3. The mitral valve is normal in structure. No evidence of mitral valve  regurgitation. No evidence of mitral stenosis.   4. The aortic valve is tricuspid. Aortic valve regurgitation is not  visualized. No aortic stenosis is present.   5. The inferior vena cava is normal in size with <50% respiratory  variability, suggesting right  atrial pressure of 8 mmHg.  Cath 03/24/2021 The left ventricular systolic function is normal. LV end diastolic pressure is normal. The left ventricular ejection fraction is 55-65% by visual estimate. There is no aortic valve stenosis. No angiographically apparent CAD.   Consider myocarditis as etiology of elevated troponin.  Consider cardiac MRI.   EKG:  EKG is not ordered today.    Recent Labs: 11/17/2020: ALT 15; TSH 3.630 03/24/2021: BUN 12; Creatinine, Ser 0.82; Magnesium 1.6; Potassium 3.4; Sodium 140 03/25/2021: Hemoglobin 12.8; Platelets 197  Recent Lipid Panel    Component Value Date/Time   CHOL 161 03/24/2021 0426   CHOL 227 (H) 11/17/2020 1544   TRIG 93 03/24/2021 0426   HDL 60 03/24/2021 0426   HDL 73 11/17/2020 1544   CHOLHDL 2.7 03/24/2021 0426   VLDL 19 03/24/2021  0426   LDLCALC 82 03/24/2021 0426   LDLCALC 131 (H) 11/17/2020 1544     Risk Assessment/Calculations:           Physical Exam:    VS:  BP 140/88   Pulse (!) 106   Ht 5\' 5"  (1.651 m)   Wt 191 lb 12.8 oz (87 kg)   SpO2 98%   BMI 31.92 kg/m     Wt Readings from Last 3 Encounters:  04/20/21 191 lb 12.8 oz (87 kg)  03/25/21 189 lb 6 oz (85.9 kg)  11/17/20 195 lb (88.5 kg)     GEN:  Well nourished, well developed in no acute distress HEENT: Normal NECK: No JVD; No carotid bruits LYMPHATICS: No lymphadenopathy CARDIAC: RRR, no murmurs, rubs, gallops RESPIRATORY:  Clear to auscultation without rales, wheezing or rhonchi  ABDOMEN: Soft, non-tender, non-distended MUSCULOSKELETAL:  No edema; No deformity  SKIN: Warm and dry NEUROLOGIC:  Alert and oriented x 3 PSYCHIATRIC:  Normal affect   ASSESSMENT:    1. Elevated troponin   2. Hyperlipidemia LDL goal <100   3. Hypothyroidism, unspecified type    PLAN:    In order of problems listed above:  Elevated troponin: Recently admitted to the hospital with chest pain with elevated troponin, cardiac catheterization however showed normal  coronary arteries.  Patient says the symptoms started after she rushed through the middle of the road to rescue a kitten.  Symptom has since resolved.  No further work-up at this time.  Hyperlipidemia: On Crestor  Hypothyroidism: On levothyroxine.     Medication Adjustments/Labs and Tests Ordered: Current medicines are reviewed at length with the patient today.  Concerns regarding medicines are outlined above.  No orders of the defined types were placed in this encounter.  No orders of the defined types were placed in this encounter.   Patient Instructions  Medication Instructions:  Your physician recommends that you continue on your current medications as directed. Please refer to the Current Medication list given to you today.  *If you need a refill on your cardiac medications before your next appointment, please call your pharmacy*  Lab Work: NONE ordered at this time of appointment   If you have labs (blood work) drawn today and your tests are completely normal, you will receive your results only by: MyChart Message (if you have MyChart) OR A paper copy in the mail If you have any lab test that is abnormal or we need to change your treatment, we will call you to review the results.  Testing/Procedures: NONE ordered at this time of appointment   Follow-Up: At [facility], you and your health needs are our priority.  As part of our continuing mission to provide you with exceptional heart care, we have created designated Provider Care Teams.  These Care Teams include your primary Cardiologist (physician) and Advanced Practice Providers (APPs -  Physician Assistants and Nurse Practitioners) who all work together to provide you with the care you need, when you need it.  We recommend signing up for the patient portal called "MyChart".  Sign up information is provided on this After Visit Summary.  MyChart is used to connect with patients for Virtual Visits (Telemedicine).  Patients  are able to view lab/test results, encounter notes, upcoming appointments, etc.  Non-urgent messages can be sent to your provider as well.   To learn more about what you can do with MyChart, go to [email or web address].    Your next appointment:   6 month(s)  The format for your next appointment:   In Person  Provider:   Olga Millers, MD  Other Instructions    Signed, Azalee Course, PA  04/22/2021 9:24 PM    [facility]

## 2021-04-20 NOTE — Patient Instructions (Signed)
Medication Instructions:  Your physician recommends that you continue on your current medications as directed. Please refer to the Current Medication list given to you today.  *If you need a refill on your cardiac medications before your next appointment, please call your pharmacy*  Lab Work: NONE ordered at this time of appointment   If you have labs (blood work) drawn today and your tests are completely normal, you will receive your results only by: . MyChart Message (if you have MyChart) OR . A paper copy in the mail If you have any lab test that is abnormal or we need to change your treatment, we will call you to review the results.  Testing/Procedures: NONE ordered at this time of appointment   Follow-Up: At [facility], you and your health needs are our priority.  As part of our continuing mission to provide you with exceptional heart care, we have created designated Provider Care Teams.  These Care Teams include your primary Cardiologist (physician) and Advanced Practice Providers (APPs -  Physician Assistants and Nurse Practitioners) who all work together to provide you with the care you need, when you need it.  We recommend signing up for the patient portal called "MyChart".  Sign up information is provided on this After Visit Summary.  MyChart is used to connect with patients for Virtual Visits (Telemedicine).  Patients are able to view lab/test results, encounter notes, upcoming appointments, etc.  Non-urgent messages can be sent to your provider as well.   To learn more about what you can do with MyChart, go to [email or web address].    Your next appointment:   6 month(s)  The format for your next appointment:   In Person  Provider:   Brian Crenshaw, MD  Other Instructions   

## 2021-04-22 ENCOUNTER — Encounter: Payer: Self-pay | Admitting: Physician Assistant

## 2021-05-14 ENCOUNTER — Ambulatory Visit: Payer: Medicare Other | Admitting: Emergency Medicine

## 2021-05-27 ENCOUNTER — Encounter: Payer: Self-pay | Admitting: Emergency Medicine

## 2021-05-27 ENCOUNTER — Ambulatory Visit (INDEPENDENT_AMBULATORY_CARE_PROVIDER_SITE_OTHER): Payer: Medicare Other | Admitting: Emergency Medicine

## 2021-05-27 ENCOUNTER — Other Ambulatory Visit: Payer: Self-pay

## 2021-05-27 VITALS — BP 160/92 | HR 77 | Temp 98.6°F | Ht 65.0 in | Wt 192.0 lb

## 2021-05-27 DIAGNOSIS — I10 Essential (primary) hypertension: Secondary | ICD-10-CM | POA: Diagnosis not present

## 2021-05-27 DIAGNOSIS — E785 Hyperlipidemia, unspecified: Secondary | ICD-10-CM

## 2021-05-27 DIAGNOSIS — R079 Chest pain, unspecified: Secondary | ICD-10-CM | POA: Diagnosis not present

## 2021-05-27 HISTORY — DX: Essential (primary) hypertension: I10

## 2021-05-27 LAB — COMPREHENSIVE METABOLIC PANEL
ALT: 21 U/L (ref 0–35)
AST: 27 U/L (ref 0–37)
Albumin: 3.9 g/dL (ref 3.5–5.2)
Alkaline Phosphatase: 68 U/L (ref 39–117)
BUN: 18 mg/dL (ref 6–23)
CO2: 27 mEq/L (ref 19–32)
Calcium: 9.3 mg/dL (ref 8.4–10.5)
Chloride: 103 mEq/L (ref 96–112)
Creatinine, Ser: 1.02 mg/dL (ref 0.40–1.20)
GFR: 53.96 mL/min — ABNORMAL LOW (ref >60.00)
Glucose, Bld: 95 mg/dL (ref 70–99)
Potassium: 4.3 mEq/L (ref 3.5–5.1)
Sodium: 137 mEq/L (ref 135–145)
Total Bilirubin: 0.4 mg/dL (ref 0.2–1.2)
Total Protein: 7.2 g/dL (ref 6.0–8.3)

## 2021-05-27 LAB — LIPID PANEL
Cholesterol: 196 mg/dL (ref 0–200)
HDL: 62.3 mg/dL (ref >39.00)
LDL Cholesterol: 99 mg/dL (ref 0–99)
NonHDL: 133.56
Total CHOL/HDL Ratio: 3
Triglycerides: 173 mg/dL — ABNORMAL HIGH (ref 0.0–149.0)
VLDL: 34.6 mg/dL (ref 0.0–40.0)

## 2021-05-27 MED ORDER — VALSARTAN 80 MG PO TABS: 80.0000 mg | ORAL_TABLET | Freq: Every day | ORAL | 3 refills | Status: DC

## 2021-05-27 NOTE — Assessment & Plan Note (Signed)
Resolved.  No further episodes of chest pain.  Negative cardiac work-up.  No evidence of coronary artery disease and normal cardiac function. Continue daily baby aspirin.

## 2021-05-27 NOTE — Progress Notes (Signed)
Lab Results  Component Value Date   CHOL 161 03/24/2021   HDL 60 03/24/2021   LDLCALC 82 03/24/2021   TRIG 93 03/24/2021   CHOLHDL 2.7 03/24/2021   Jacqueline Wyatt 75 y.o.   Chief Complaint  Patient presents with   Follow-up    6 month    HISTORY OF PRESENT ILLNESS: This is a 75 y.o. female here for follow-up of hospital admission last May when she presented complaining of chest pain and initial work-up showed increased troponin.  However cardiac work-up was within normal limits. Advised to take 1 baby aspirin a day and Crestor.  Not taking cholesterol medication. Doing well.  Has no complaints or medical concerns today. BP Readings from Last 3 Encounters:  04/20/21 140/88  03/25/21 136/84  11/17/20 (!) 134/92   Discharge Summary    Patient ID: Jacqueline Wyatt MRN: [id number]; DOB: 1945-11-23   Admit date: 03/23/2021 Discharge date: 03/25/2021   PCP:  Jacqueline Quint, Wyatt              [facility] Providers Cardiologist:  Jacqueline Millers, Wyatt    Discharge Diagnoses    Principal Problem:   Chest pain of uncertain etiology Active Problems:   Elevated troponin level not due myocardial infarction Diagnostic Studies/Procedures    ECHO: 03/25/2021 EF normal   CARDIAC CATH: 03/24/2021 The left ventricular systolic function is normal. LV end diastolic pressure is normal. The left ventricular ejection fraction is 55-65% by visual estimate. There is no aortic valve stenosis. No angiographically apparent CAD.   Consider myocarditis as etiology of elevated troponin.  Consider cardiac MRI.   HPI   Prior to Admission medications   Medication Sig Start Date End Date Taking? Authorizing Provider  aspirin 325 MG EC tablet Take 325 mg by mouth daily as needed for pain.    Provider, Historical, Wyatt  aspirin EC 81 MG EC tablet Take 1 tablet (81 mg total) by mouth daily. Swallow whole. 03/25/21   Jacqueline Wyatt  B Complex Vitamins (VITAMIN B COMPLEX PO) Take 1 tablet by  mouth daily.    Provider, Historical, Wyatt  Coenzyme Q10 50 MG CHEW Chew 100 mg by mouth daily.    Provider, Historical, Wyatt  COVID-19 mRNA vaccine, Pfizer, 30 MCG/0.3ML injection INJECT AS DIRECTED 11/25/20 11/25/21  Jacqueline Wyatt  levothyroxine (SYNTHROID) 50 MCG tablet TAKE 1 TABLET BY MOUTH EVERY DAY BEFORE BREAKFAST 06/18/20   Jacqueline Quint, Wyatt  loratadine (CLARITIN) 10 MG tablet Take 10 mg by mouth daily.    Provider, Historical, Wyatt  rosuvastatin (CRESTOR) 20 MG tablet Take 1 tablet (20 mg total) by mouth daily. 11/18/20   Jacqueline Wyatt  traMADol (ULTRAM) 50 MG tablet Take 1 tablet (50 mg total) by mouth 2 (two) times daily. As needed. Patient taking differently: Take 25 mg by mouth 2 (two) times daily. 11/17/20   Jacqueline Wyatt  Vitamin D, Ergocalciferol, (DRISDOL) 1.25 MG (50000 UNIT) CAPS capsule Take 1 capsule (50,000 Units total) by mouth every 7 (seven) days. 11/17/20   Jacqueline Wyatt    Allergies  Allergen Reactions   Almotriptan Malate     Chest pain   Banana    Eggs Or Egg-Derived Products     Stomach pain   Imitrex [Sumatriptan Base]    Penicillins    Relpax [Eletriptan Hydrobromide] Hypertension   Sulfa Drugs Cross Reactors    Vantin     dizziness   Watermelon Flavor  Azithromycin Anxiety    Patient Active Problem List   Diagnosis Date Noted   Chest pain of uncertain etiology 03/25/2021   Elevated troponin level not due myocardial infarction 03/24/2021   Elevated troponin    Stage 3a chronic kidney disease (HCC) 11/07/2019   Dyslipidemia 11/07/2019   Chronic pain syndrome 08/05/2017   Vitamin D deficiency 08/05/2017   Generalized anxiety disorder 09/09/2014   Hypothyroidism 11/24/2011   Migraine 11/24/2011   Environmental allergies 11/24/2011   Fibromyalgia 11/24/2011   Irritable bowel syndrome (IBS) 11/24/2011   Osteoporosis 11/24/2011    Past Medical History:  Diagnosis Date   Allergy    Arthritis    Asthma    Chest pain of  uncertain etiology 03/25/2021   Fibromyalgia    Fibromyalgia muscle pain    Neuromuscular disorder (HCC)    Osteoporosis    Thyroid disease     Past Surgical History:  Procedure Laterality Date   LEFT HEART CATH AND CORONARY ANGIOGRAPHY N/A 03/24/2021   Procedure: LEFT HEART CATH AND CORONARY ANGIOGRAPHY;  Surgeon: Corky Crafts, Wyatt;  Location: [facility];  Service: Cardiovascular;  Laterality: N/A;    Social History   Socioeconomic History   Marital status: Single    Spouse name: Not on file   Number of children: Not on file   Years of education: Not on file   Highest education level: Not on file  Occupational History   Not on file  Tobacco Use   Smoking status: Never   Smokeless tobacco: Never  Substance and Sexual Activity   Alcohol use: Not on file   Drug use: Not on file   Sexual activity: Not on file  Other Topics Concern   Not on file  Social History Narrative   Not on file   Social Determinants of Health   Financial Resource Strain: Not on file  Food Insecurity: Not on file  Transportation Needs: Not on file  Physical Activity: Not on file  Stress: Not on file  Social Connections: Not on file  Intimate Partner Violence: Not on file    Family History  Problem Relation Age of Onset   Heart disease Mother    Hypertension Mother    Hyperlipidemia Mother    Hearing loss Mother    Kidney disease Mother    Vision loss Mother    Lung disease Mother    Stroke Father    Arthritis Sister    Meniere's disease Brother    Cancer Maternal Grandmother    Stroke Maternal Grandmother    Heart disease Maternal Grandfather    Heart disease Maternal Uncle      Review of Systems  Constitutional: Negative.  Negative for chills and fever.  HENT: Negative.  Negative for congestion and sore throat.   Respiratory: Negative.  Negative for cough and shortness of breath.   Cardiovascular:  Negative for chest pain and palpitations.  Gastrointestinal:  Negative.  Negative for abdominal pain, diarrhea, nausea and vomiting.  Genitourinary: Negative.  Negative for dysuria and hematuria.  Skin: Negative.  Negative for rash.  Neurological:  Negative for dizziness and headaches.  All other systems reviewed and are negative.  Today's Vitals   05/27/21 1333  BP: (!) 172/94  Pulse: 77  Temp: 98.6 F (37 C)  TempSrc: Oral  SpO2: 97%  Weight: 192 lb (87.1 kg)  Height: 5\' 5"  (1.651 m)   Body mass index is 31.95 kg/m.  Physical Exam Vitals reviewed.  Constitutional:  Appearance: Normal appearance.  HENT:     Head: Normocephalic.  Eyes:     Extraocular Movements: Extraocular movements intact.     Pupils: Pupils are equal, round, and reactive to light.  Cardiovascular:     Rate and Rhythm: Normal rate.  Pulmonary:     Effort: Pulmonary effort is normal.  Skin:    General: Skin is warm and dry.     Capillary Refill: Capillary refill takes less than 2 seconds.  Neurological:     General: No focal deficit present.     Mental Status: She is alert and oriented to person, place, and time.  Psychiatric:        Mood and Affect: Mood normal.        Behavior: Behavior normal.     ASSESSMENT & PLAN: Essential hypertension Elevated blood pressure readings. Recommend to start valsartan 80 mg daily. Recommend to monitor blood pressure readings at home several times a day for the next couple of weeks and keep a log Dietary approaches to stop hypertension discussed. Follow-up in 4 weeks.  Dyslipidemia Normal lipid profile last May.  We will repeat lipid profile today. Patient does not want to take rosuvastatin as prescribed by cardiologist. Diet and nutrition discussed.  Chest pain of uncertain etiology Resolved.  No further episodes of chest pain.  Negative cardiac work-up.  No evidence of coronary artery disease and normal cardiac function. Continue daily baby aspirin.  [name] was seen today for follow-up.  Diagnoses and all  orders for this visit:  Dyslipidemia -     Comprehensive metabolic panel -     Lipid panel  Essential hypertension -     valsartan (DIOVAN) 80 MG tablet; Take 1 tablet (80 mg total) by mouth daily.  Chest pain of uncertain etiology  Patient Instructions  Start valsartan 80 mg daily. Purchase blood pressure machine and monitor blood pressure readings at home several times a day for the next couple of weeks and keep a log. Hypertension, Adult High blood pressure (hypertension) is when the force of blood pumping through the arteries is too strong. The arteries are the blood vessels that carry blood from the heart throughout the body. Hypertension forces the heart to work harder to pump blood and may cause arteries to become narrow or stiff. Untreated or uncontrolled hypertension can cause a heart attack, heart failure, a stroke, kidney disease, and otherproblems. A blood pressure reading consists of a higher number over a lower number. Ideally, your blood pressure should be below 120/80. The first ("top") number is called the systolic pressure. It is a measure of the pressure in your arteries as your heart beats. The second ("bottom") number is called the diastolic pressure. It is a measure of the pressure in your arteries as theheart relaxes. What are the causes? The exact cause of this condition is not known. There are some conditions thatresult in or are related to high blood pressure. What increases the risk? Some risk factors for high blood pressure are under your control. The following factors may make you more likely to develop this condition: Smoking. Having type 2 diabetes mellitus, high cholesterol, or both. Not getting enough exercise or physical activity. Being overweight. Having too much fat, sugar, calories, or salt (sodium) in your diet. Drinking too much alcohol. Some risk factors for high blood pressure may be difficult or impossible to change. Some of these factors  include: Having chronic kidney disease. Having a family history of high blood pressure. Age. Risk increases  with age. Race. You may be at higher risk if you are African American. Gender. Men are at higher risk than women before age 10. After age 71, women are at higher risk than men. Having obstructive sleep apnea. Stress. What are the signs or symptoms? High blood pressure may not cause symptoms. Very high blood pressure (hypertensive crisis) may cause: Headache. Anxiety. Shortness of breath. Nosebleed. Nausea and vomiting. Vision changes. Severe chest pain. Seizures. How is this diagnosed? This condition is diagnosed by measuring your blood pressure while you are seated, with your arm resting on a flat surface, your legs uncrossed, and your feet flat on the floor. The cuff of the blood pressure monitor will be placed directly against the skin of your upper arm at the level of your heart. It should be measured at least twice using the same arm. Certain conditions cancause a difference in blood pressure between your right and left arms. Certain factors can cause blood pressure readings to be lower or higher than normal for a short period of time: When your blood pressure is higher when you are in a health care provider's office than when you are at home, this is called white coat hypertension. Most people with this condition do not need medicines. When your blood pressure is higher at home than when you are in a health care provider's office, this is called masked hypertension. Most people with this condition may need medicines to control blood pressure. If you have a high blood pressure reading during one visit or you have normal blood pressure with other risk factors, you may be asked to: Return on a different day to have your blood pressure checked again. Monitor your blood pressure at home for 1 week or longer. If you are diagnosed with hypertension, you may have other blood or imaging  tests to help your health care provider understand your overall risk for otherconditions. How is this treated? This condition is treated by making healthy lifestyle changes, such as eating healthy foods, exercising more, and reducing your alcohol intake. Your health care provider may prescribe medicine if lifestyle changes are not enough to get your blood pressure under control, and if: Your systolic blood pressure is above 130. Your diastolic blood pressure is above 80. Your personal target blood pressure may vary depending on your medicalconditions, your age, and other factors. Follow these instructions at home: Eating and drinking  Eat a diet that is high in fiber and potassium, and [place] in sodium, added sugar, and fat. An example eating plan is called the DASH (Dietary Approaches to Stop Hypertension) diet. To eat this way: Eat plenty of fresh fruits and vegetables. Try to fill one half of your plate at each meal with fruits and vegetables. Eat whole grains, such as whole-wheat pasta, brown rice, or whole-grain bread. Fill about one fourth of your plate with whole grains. Eat or drink [place]owski-fat dairy products, such as skim milk or Mccleod-fat yogurt. Avoid fatty cuts of meat, processed or cured meats, and poultry with skin. Fill about one fourth of your plate with lean proteins, such as fish, chicken without skin, beans, eggs, or tofu. Avoid pre-made and processed foods. These tend to be higher in sodium, added sugar, and fat. Reduce your daily sodium intake. Most people with hypertension should eat less than 1,500 mg of sodium a day. Do not drink alcohol if: Your health care provider tells you not to drink. You are pregnant, may be pregnant, or are planning to become pregnant.  If you drink alcohol: Limit how much you use to: 0-1 drink a day for women. 0-2 drinks a day for men. Be aware of how much alcohol is in your drink. In the [place] one drink equals one 12 oz bottle of beer (355 mL), one 5 oz  glass of wine (148 mL), or one 1 oz glass of hard liquor (44 mL).  Lifestyle  Work with your health care provider to maintain a healthy body weight or to lose weight. Ask what an ideal weight is for you. Get at least 30 minutes of exercise most days of the week. Activities may include walking, swimming, or biking. Include exercise to strengthen your muscles (resistance exercise), such as Pilates or lifting weights, as part of your weekly exercise routine. Try to do these types of exercises for 30 minutes at least 3 days a week. Do not use any products that contain nicotine or tobacco, such as cigarettes, e-cigarettes, and chewing tobacco. If you need help quitting, ask your health care provider. Monitor your blood pressure at home as told by your health care provider. Keep all follow-up visits as told by your health care provider. This is important.  Medicines Take over-the-counter and prescription medicines only as told by your health care provider. Follow directions carefully. Blood pressure medicines must be taken as prescribed. Do not skip doses of blood pressure medicine. Doing this puts you at risk for problems and can make the medicine less effective. Ask your health care provider about side effects or reactions to medicines that you should watch for. Contact a health care provider if you: Think you are having a reaction to a medicine you are taking. Have headaches that keep coming back (recurring). Feel dizzy. Have swelling in your ankles. Have trouble with your vision. Get help right away if you: Develop a severe headache or confusion. Have unusual weakness or numbness. Feel faint. Have severe pain in your chest or abdomen. Vomit repeatedly. Have trouble breathing. Summary Hypertension is when the force of blood pumping through your arteries is too strong. If this condition is not controlled, it may put you at risk for serious complications. Your personal target blood pressure  may vary depending on your medical conditions, your age, and other factors. For most people, a normal blood pressure is less than 120/80. Hypertension is treated with lifestyle changes, medicines, or a combination of both. Lifestyle changes include losing weight, eating a healthy, Oddo-sodium diet, exercising more, and limiting alcohol. This information is not intended to replace advice given to you by your health care provider. Make sure you discuss any questions you have with your healthcare provider. Document Revised: 06/21/2018 Document Reviewed: 06/21/2018 Elsevier Patient Education  2022 [employer]vier [employer].    Edwina Barth, Wyatt [facility]

## 2021-05-27 NOTE — Assessment & Plan Note (Signed)
Elevated blood pressure readings. Recommend to start valsartan 80 mg daily. Recommend to monitor blood pressure readings at home several times a day for the next couple of weeks and keep a log Dietary approaches to stop hypertension discussed. Follow-up in 4 weeks.

## 2021-05-27 NOTE — Patient Instructions (Signed)
Start valsartan 80 mg daily. Purchase blood pressure machine and monitor blood pressure readings at home several times a day for the next couple of weeks and keep a log. Hypertension, Adult High blood pressure (hypertension) is when the force of blood pumping through the arteries is too strong. The arteries are the blood vessels that carry blood from the heart throughout the body. Hypertension forces the heart to work harder to pump blood and may cause arteries to become narrow or stiff. Untreated or uncontrolled hypertension can cause a heart attack, heart failure, a stroke, kidney disease, and otherproblems. A blood pressure reading consists of a higher number over a lower number. Ideally, your blood pressure should be below 120/80. The first ("top") number is called the systolic pressure. It is a measure of the pressure in your arteries as your heart beats. The second ("bottom") number is called the diastolic pressure. It is a measure of the pressure in your arteries as theheart relaxes. What are the causes? The exact cause of this condition is not known. There are some conditions thatresult in or are related to high blood pressure. What increases the risk? Some risk factors for high blood pressure are under your control. The following factors may make you more likely to develop this condition: Smoking. Having type 2 diabetes mellitus, high cholesterol, or both. Not getting enough exercise or physical activity. Being overweight. Having too much fat, sugar, calories, or salt (sodium) in your diet. Drinking too much alcohol. Some risk factors for high blood pressure may be difficult or impossible to change. Some of these factors include: Having chronic kidney disease. Having a family history of high blood pressure. Age. Risk increases with age. Race. You may be at higher risk if you are African American. Gender. Men are at higher risk than women before age 29. After age 75, women are at higher  risk than men. Having obstructive sleep apnea. Stress. What are the signs or symptoms? High blood pressure may not cause symptoms. Very high blood pressure (hypertensive crisis) may cause: Headache. Anxiety. Shortness of breath. Nosebleed. Nausea and vomiting. Vision changes. Severe chest pain. Seizures. How is this diagnosed? This condition is diagnosed by measuring your blood pressure while you are seated, with your arm resting on a flat surface, your legs uncrossed, and your feet flat on the floor. The cuff of the blood pressure monitor will be placed directly against the skin of your upper arm at the level of your heart. It should be measured at least twice using the same arm. Certain conditions cancause a difference in blood pressure between your right and left arms. Certain factors can cause blood pressure readings to be lower or higher than normal for a short period of time: When your blood pressure is higher when you are in a health care provider's office than when you are at home, this is called white coat hypertension. Most people with this condition do not need medicines. When your blood pressure is higher at home than when you are in a health care provider's office, this is called masked hypertension. Most people with this condition may need medicines to control blood pressure. If you have a high blood pressure reading during one visit or you have normal blood pressure with other risk factors, you may be asked to: Return on a different day to have your blood pressure checked again. Monitor your blood pressure at home for 1 week or longer. If you are diagnosed with hypertension, you may have other blood  or imaging tests to help your health care provider understand your overall risk for otherconditions. How is this treated? This condition is treated by making healthy lifestyle changes, such as eating healthy foods, exercising more, and reducing your alcohol intake. Your health care  provider may prescribe medicine if lifestyle changes are not enough to get your blood pressure under control, and if: Your systolic blood pressure is above 130. Your diastolic blood pressure is above 80. Your personal target blood pressure may vary depending on your medicalconditions, your age, and other factors. Follow these instructions at home: Eating and drinking  Eat a diet that is high in fiber and potassium, and [place] in sodium, added sugar, and fat. An example eating plan is called the DASH (Dietary Approaches to Stop Hypertension) diet. To eat this way: Eat plenty of fresh fruits and vegetables. Try to fill one half of your plate at each meal with fruits and vegetables. Eat whole grains, such as whole-wheat pasta, brown rice, or whole-grain bread. Fill about one fourth of your plate with whole grains. Eat or drink [place]-fat dairy products, such as skim milk or [place]-fat yogurt. Avoid fatty cuts of meat, processed or cured meats, and poultry with skin. Fill about one fourth of your plate with lean proteins, such as fish, chicken without skin, beans, eggs, or tofu. Avoid pre-made and processed foods. These tend to be higher in sodium, added sugar, and fat. Reduce your daily sodium intake. Most people with hypertension should eat less than 1,500 mg of sodium a day. Do not drink alcohol if: Your health care provider tells you not to drink. You are pregnant, may be pregnant, or are planning to become pregnant. If you drink alcohol: Limit how much you use to: 0-1 drink a day for women. 0-2 drinks a day for men. Be aware of how much alcohol is in your drink. In the [place] one drink equals one 12 oz bottle of beer (355 mL), one 5 oz glass of wine (148 mL), or one 1 oz glass of hard liquor (44 mL).  Lifestyle  Work with your health care provider to maintain a healthy body weight or to lose weight. Ask what an ideal weight is for you. Get at least 30 minutes of exercise most days of the week.  Activities may include walking, swimming, or biking. Include exercise to strengthen your muscles (resistance exercise), such as Pilates or lifting weights, as part of your weekly exercise routine. Try to do these types of exercises for 30 minutes at least 3 days a week. Do not use any products that contain nicotine or tobacco, such as cigarettes, e-cigarettes, and chewing tobacco. If you need help quitting, ask your health care provider. Monitor your blood pressure at home as told by your health care provider. Keep all follow-up visits as told by your health care provider. This is important.  Medicines Take over-the-counter and prescription medicines only as told by your health care provider. Follow directions carefully. Blood pressure medicines must be taken as prescribed. Do not skip doses of blood pressure medicine. Doing this puts you at risk for problems and can make the medicine less effective. Ask your health care provider about side effects or reactions to medicines that you should watch for. Contact a health care provider if you: Think you are having a reaction to a medicine you are taking. Have headaches that keep coming back (recurring). Feel dizzy. Have swelling in your ankles. Have trouble with your vision. Get help right away if  you: Develop a severe headache or confusion. Have unusual weakness or numbness. Feel faint. Have severe pain in your chest or abdomen. Vomit repeatedly. Have trouble breathing. Summary Hypertension is when the force of blood pumping through your arteries is too strong. If this condition is not controlled, it may put you at risk for serious complications. Your personal target blood pressure may vary depending on your medical conditions, your age, and other factors. For most people, a normal blood pressure is less than 120/80. Hypertension is treated with lifestyle changes, medicines, or a combination of both. Lifestyle changes include losing weight,  eating a healthy, [place]-sodium diet, exercising more, and limiting alcohol. This information is not intended to replace advice given to you by your health care provider. Make sure you discuss any questions you have with your healthcare provider. Document Revised: 06/21/2018 Document Reviewed: 06/21/2018 Elsevier Patient Education  2022 [employer].

## 2021-05-27 NOTE — Assessment & Plan Note (Signed)
Normal lipid profile last May.  We will repeat lipid profile today. Patient does not want to take rosuvastatin as prescribed by cardiologist. Diet and nutrition discussed.

## 2021-06-11 ENCOUNTER — Ambulatory Visit (HOSPITAL_COMMUNITY)
Admission: EM | Admit: 2021-06-11 | Discharge: 2021-06-11 | Disposition: A | Discharge status: Home / Self Care | Payer: Medicare Other | Attending: Physician Assistant | Admitting: Physician Assistant

## 2021-06-11 ENCOUNTER — Ambulatory Visit (INDEPENDENT_AMBULATORY_CARE_PROVIDER_SITE_OTHER): Payer: Medicare Other

## 2021-06-11 ENCOUNTER — Encounter (HOSPITAL_COMMUNITY): Payer: Self-pay | Admitting: Emergency Medicine

## 2021-06-11 ENCOUNTER — Other Ambulatory Visit: Payer: Self-pay

## 2021-06-11 DIAGNOSIS — S99921A Unspecified injury of right foot, initial encounter: Secondary | ICD-10-CM

## 2021-06-11 DIAGNOSIS — M79671 Pain in right foot: Secondary | ICD-10-CM | POA: Diagnosis not present

## 2021-06-11 DIAGNOSIS — R030 Elevated blood-pressure reading, without diagnosis of hypertension: Secondary | ICD-10-CM | POA: Diagnosis not present

## 2021-06-11 NOTE — ED Provider Notes (Signed)
[facility]    CSN: [id number] Arrival date & time: 06/11/21  1642      History   Chief Complaint Chief Complaint  Patient presents with   Toe Injury    HPI Jacqueline Wyatt is a 75 y.o. female.   Patient presents today with a 4-day history of right toe and foot pain following injury.  Reports that 4 days ago she was walking around her house without shoes when she hit the area between her fourth and fifth toe on the corner of a cabinet.  She immediately felt significant pain and reports that her fifth toe was then out of place.  She then put it back in place and has been taping it to the fourth or which has provided some improvement of symptoms.  Pain is rated 6 on a 0-10 pain scale, localized to fourth and fifth toe with radiation to dorsal foot, described as aching with periodic sharp pains, worse with movement of toe or attempted ambulation, no alleviating factors identified.  She denies any numbness or paresthesias.  She has not tried any over-the-counter medications for symptom management.  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    In addition, patient is requesting that we recheck her blood pressure.  This is noted to be elevated today and has been elevated at several recent doctors visits.  She denies any chest pain, headache, dizziness, vision changes.  She has been monitoring her blood pressure at home and reports this consistently been at goal with a systolic ranging from 97-136 and diastolic ranging from 69-95.  She is interested in rechecking her blood pressure and comparing this to her at home machine to ensure she is getting accurate readings.   Past Medical History:  Diagnosis Date   Allergy    Arthritis    Asthma    Chest pain of uncertain etiology 03/25/2021   Fibromyalgia    Fibromyalgia muscle pain    Neuromuscular disorder (HCC)    Osteoporosis    Thyroid disease     Patient  Active Problem List   Diagnosis Date Noted   Essential hypertension 05/27/2021   Chest pain of uncertain etiology 03/25/2021   Elevated troponin level not due myocardial infarction 03/24/2021   Elevated troponin    Stage 3a chronic kidney disease (HCC) 11/07/2019   Dyslipidemia 11/07/2019   Chronic pain syndrome 08/05/2017   Vitamin D deficiency 08/05/2017   Generalized anxiety disorder 09/09/2014   Hypothyroidism 11/24/2011   Migraine 11/24/2011   Environmental allergies 11/24/2011   Fibromyalgia 11/24/2011   Irritable bowel syndrome (IBS) 11/24/2011   Osteoporosis 11/24/2011    Past Surgical History:  Procedure Laterality Date   LEFT HEART CATH AND CORONARY ANGIOGRAPHY N/A 03/24/2021   Procedure: LEFT HEART CATH AND CORONARY ANGIOGRAPHY;  Surgeon: Corky Crafts, MD;  Location: [facility];  Service: Cardiovascular;  Laterality: N/A;    OB History   No obstetric history on file.      Home Medications    Prior to Admission medications   Medication Sig Start Date End Date Taking? Authorizing Provider  aspirin 325 MG EC tablet Take 325 mg by mouth daily as needed for pain.    [provider]  aspirin EC 81 MG EC tablet Take 1 tablet (81 mg total) by mouth daily. Swallow whole. 03/25/21   Barrett, Joline Salt, PA-C  B Complex Vitamins (VITAMIN B COMPLEX PO) Take  1 tablet by mouth daily.    [provider]  Coenzyme Q10 50 MG CHEW Chew 100 mg by mouth daily.    [provider]  COVID-19 mRNA vaccine, Pfizer, 30 MCG/0.3ML injection INJECT AS DIRECTED 11/25/20 11/25/21  Judyann Munson, MD  levothyroxine (SYNTHROID) 50 MCG tablet TAKE 1 TABLET BY MOUTH EVERY DAY BEFORE BREAKFAST 06/18/20   Georgina Quint, MD  loratadine (CLARITIN) 10 MG tablet Take 10 mg by mouth daily.    [provider]  rosuvastatin (CRESTOR) 20 MG tablet Take 1 tablet (20 mg total) by mouth daily. Patient not taking: Reported on 05/27/2021 11/18/20   Just, Azalee Course,  FNP  traMADol (ULTRAM) 50 MG tablet Take 1 tablet (50 mg total) by mouth 2 (two) times daily. As needed. Patient taking differently: Take 25 mg by mouth 2 (two) times daily. 11/17/20   Just, Azalee Course, FNP  valsartan (DIOVAN) 80 MG tablet Take 1 tablet (80 mg total) by mouth daily. 05/27/21   Georgina Quint, MD  Vitamin D, Ergocalciferol, (DRISDOL) 1.25 MG (50000 UNIT) CAPS capsule Take 1 capsule (50,000 Units total) by mouth every 7 (seven) days. 11/17/20   Just, Azalee Course, FNP    Family History Family History  Problem Relation Age of Onset   Heart disease Mother    Hypertension Mother    Hyperlipidemia Mother    Hearing loss Mother    Kidney disease Mother    Vision loss Mother    Lung disease Mother    Stroke Father    Arthritis Sister    Meniere's disease Brother    Cancer Maternal Grandmother    Stroke Maternal Grandmother    Heart disease Maternal Grandfather    Heart disease Maternal Uncle     Social History Social History   Tobacco Use   Smoking status: Never   Smokeless tobacco: Never     Allergies   Almotriptan malate, Banana, Eggs or egg-derived products, Imitrex [sumatriptan base], Penicillins, Relpax [eletriptan hydrobromide], Sulfa drugs cross reactors, Vantin, Watermelon flavor, and Azithromycin   Review of Systems Review of Systems  Constitutional:  Positive for activity change. Negative for appetite change, fatigue and fever.  Eyes:  Negative for visual disturbance.  Respiratory:  Negative for cough and shortness of breath.   Cardiovascular:  Negative for chest pain.  Gastrointestinal:  Negative for abdominal pain, diarrhea, nausea and vomiting.  Musculoskeletal:  Positive for arthralgias, gait problem and joint swelling. Negative for myalgias.  Skin:  Positive for color change. Negative for wound.  Neurological:  Negative for dizziness, light-headedness and headaches.    Physical Exam Triage Vital Signs ED Triage Vitals [06/11/21 1757]  Enc  Vitals Group     BP (!) 159/88     Pulse Rate 79     Resp 16     Temp 98.7 F (37.1 C)     Temp Source Oral     SpO2 98 %     Weight      Height      Head Circumference      Peak Flow      Pain Score 6     Pain Loc      Pain Edu?      Excl. in GC?    No data found.  Updated Vital Signs BP 133/72 (BP Location: Left Arm)   Pulse 80   Temp 98.7 F (37.1 C) (Oral)   Resp 16   SpO2 98%   Visual Acuity Right Eye  Distance:   Left Eye Distance:   Bilateral Distance:    Right Eye Near:   Left Eye Near:    Bilateral Near:     Physical Exam Vitals reviewed.  Constitutional:      General: She is awake. She is not in acute distress.    Appearance: Normal appearance. She is normal weight. She is not ill-appearing.     Comments: Very pleasant female appears stated age no acute distress sitting comfortably in exam room  HENT:     Head: Normocephalic and atraumatic.  Cardiovascular:     Rate and Rhythm: Normal rate and regular rhythm.     Heart sounds: Normal heart sounds, S1 normal and S2 normal. No murmur heard. Pulmonary:     Effort: Pulmonary effort is normal.     Breath sounds: Normal breath sounds. No wheezing, rhonchi or rales.     Comments: Clear to auscultation bilaterally Abdominal:     General: Bowel sounds are normal.     Palpations: Abdomen is soft.     Tenderness: There is no abdominal tenderness. There is no right CVA tenderness, left CVA tenderness, guarding or rebound.  Musculoskeletal:     Right lower leg: No edema.     Left lower leg: No edema.     Right foot: Decreased range of motion. Normal capillary refill. Swelling, tenderness and bony tenderness present. No deformity or laceration.     Comments: Right foot: Foot neurovascularly intact.  Significant tenderness to palpation over fifth toe and MTP joint.  Bruising noted on dorsal right foot.  No tenderness to palpation over metatarsals.  No deformity noted.  Psychiatric:        Behavior: Behavior is  cooperative.     UC Treatments / Results  Labs (all labs ordered are listed, but only abnormal results are displayed) Labs Reviewed - No data to display  EKG   Radiology DG Foot Complete Right  Result Date: 06/11/2021 CLINICAL DATA:  Pain and bruising EXAM: RIGHT FOOT COMPLETE - 3+ VIEW COMPARISON:  None. FINDINGS: Acute nondisplaced fracture involving the midportion of fifth proximal phalanx. No malalignment. Positive for soft tissue swelling. Mild degenerative change at the first MTP joint. IMPRESSION: Acute nondisplaced fracture involving the fifth proximal phalanx Electronically Signed   By: Jasmine PangKim  Fujinaga M.D.   On: 06/11/2021 19:02    Procedures Procedures (including critical care time)  Medications Ordered in UC Medications - No data to display  Initial Impression / Assessment and Plan / UC Course  I have reviewed the triage vital signs and the nursing notes.  Pertinent labs & imaging results that were available during my care of the patient were reviewed by me and considered in my medical decision making (see chart for details).      Home blood pressure machine was found to be pretty accurate compared to our reading.  Patient was encouraged to continue monitoring her blood pressure and keep log for evaluation of follow-up appointment.  Discussed that if she develops any chest pain, shortness of breath, leg swelling, vision changes in the setting of high blood pressure she needs to go to the emergency room.  Encouraged her to follow-up with her primary care provider regarding medication management as scheduled.  X-ray showed nondisplaced fracture of fifth proximal phalanx.  Buddy tape toes and patient was placed in postop shoe.  Recommended to use over-the-counter medication such as Tylenol and ibuprofen for pain relief.  Recommended follow-up with podiatry patient was given contact information for  local group.  Discussed alarm symptoms that warrant emergent evaluation.  Strict  return precautions given to which patient expressed understanding.  Final Clinical Impressions(s) / UC Diagnoses   Final diagnoses:  Right foot pain  Injury of right toe, initial encounter  Elevated blood pressure reading     Discharge Instructions      Your blood pressure was appropriate and your machine appears to be functioning normally.  Please continue monitoring this regularly and follow-up with your primary care provider.  If you develop any chest pain, shortness of breath, headache, dizziness in the setting of high blood pressure you need to go to the emergency room.  Your toe is fractured.  We are going to buddy tape it and put you in a postop shoe.  You can alternate Tylenol and ibuprofen for pain.  Please follow-up with podiatry as we discussed.     ED Prescriptions   None    PDMP not reviewed this encounter.   Jeani Hawking, PA-C 06/11/21 1919

## 2021-06-11 NOTE — Discharge Instructions (Addendum)
Your blood pressure was appropriate and your machine appears to be functioning normally.  Please continue monitoring this regularly and follow-up with your primary care provider.  If you develop any chest pain, shortness of breath, headache, dizziness in the setting of high blood pressure you need to go to the emergency room.  Your toe is fractured.  We are going to buddy tape it and put you in a postop shoe.  You can alternate Tylenol and ibuprofen for pain.  Please follow-up with podiatry as we discussed.

## 2021-06-11 NOTE — ED Notes (Signed)
Checked blood pressure with patients home machine as patient asked.  Home machine 134/88, 74.

## 2021-06-11 NOTE — ED Triage Notes (Signed)
Pt said she hit her toe on the corner of drawer x 4 days and now bruised and hard to walk on. Pt also wants her BP checked.

## 2021-07-15 ENCOUNTER — Telehealth: Payer: Self-pay | Admitting: Emergency Medicine

## 2021-07-15 NOTE — Telephone Encounter (Signed)
1.Medication Requested:  traMADol (ULTRAM) 50 MG tablet   2. Pharmacy (Name, Street, [facility]): [id number] - [place], [place] - [address] AT [facility] OF [facility] [address]  Phone:  [phone] Fax:  [phone]   3. On Med List: yes  4. Last Visit with PCP: 08.03.22  5. Next visit date with PCP: n/a  Patient also wanted to make provider aware that she has been monitoriing her BP at home & it has been stable so she stopped taking the valsartan (DIOVAN) 80 MG tablet   Agent: Please be advised that RX refills may take up to 3 business days. We ask that you follow-up with your pharmacy.

## 2021-07-16 ENCOUNTER — Other Ambulatory Visit: Payer: Self-pay | Admitting: Emergency Medicine

## 2021-07-16 DIAGNOSIS — G894 Chronic pain syndrome: Secondary | ICD-10-CM

## 2021-07-16 DIAGNOSIS — M797 Fibromyalgia: Secondary | ICD-10-CM

## 2021-07-16 MED ORDER — TRAMADOL HCL 50 MG PO TABS: 25.0000 mg | ORAL_TABLET | Freq: Two times a day (BID) | ORAL | 1 refills | Status: AC | PRN

## 2021-07-16 NOTE — Telephone Encounter (Signed)
New prescription sent to pharmacy of record.  Thanks.

## 2021-07-20 ENCOUNTER — Other Ambulatory Visit: Payer: Self-pay | Admitting: Emergency Medicine

## 2021-07-20 DIAGNOSIS — E039 Hypothyroidism, unspecified: Secondary | ICD-10-CM

## 2021-09-30 ENCOUNTER — Ambulatory Visit: Payer: Medicare Other | Admitting: Cardiology

## 2021-11-05 ENCOUNTER — Ambulatory Visit: Payer: Medicare Other | Attending: Internal Medicine

## 2021-11-05 DIAGNOSIS — Z23 Encounter for immunization: Secondary | ICD-10-CM

## 2021-11-05 NOTE — Progress Notes (Signed)
° °  Covid-19 Vaccination Clinic  Name:  Jacqueline Wyatt    MRN: [id number] DOB: 1946/09/19  11/05/2021  [name] was observed post Covid-19 immunization for 15 minutes without incident. She was provided with Vaccine Information Sheet and instruction to access the V-Safe system.   [name] was instructed to call 911 with any severe reactions post vaccine: Difficulty breathing  Swelling of face and throat  A fast heartbeat  A bad rash all over body  Dizziness and weakness   Immunizations Administered     Name Date Dose VIS Date Route   Pfizer Covid-19 Vaccine Bivalent Booster 11/05/2021  2:38 PM 0.3 mL 06/24/2021 Intramuscular   Manufacturer: [place]   Lot: [id number]   [place]: [phone]

## 2021-11-06 ENCOUNTER — Other Ambulatory Visit (HOSPITAL_BASED_OUTPATIENT_CLINIC_OR_DEPARTMENT_OTHER): Payer: Self-pay

## 2021-11-06 MED ORDER — PFIZER COVID-19 VAC BIVALENT 30 MCG/0.3ML IM SUSP
INTRAMUSCULAR | 0 refills | Status: DC
  Filled 2021-11-06: qty 0.3, 1d supply, fill #0

## 2021-11-13 ENCOUNTER — Ambulatory Visit (INDEPENDENT_AMBULATORY_CARE_PROVIDER_SITE_OTHER): Payer: Medicare Other | Admitting: Nurse Practitioner

## 2021-11-13 ENCOUNTER — Other Ambulatory Visit: Payer: Self-pay | Admitting: Emergency Medicine

## 2021-11-13 ENCOUNTER — Encounter: Payer: Self-pay | Admitting: Adult Health

## 2021-11-13 ENCOUNTER — Other Ambulatory Visit: Payer: Self-pay

## 2021-11-13 VITALS — BP 150/88 | HR 80 | Ht 65.0 in | Wt 195.4 lb

## 2021-11-13 DIAGNOSIS — E782 Mixed hyperlipidemia: Secondary | ICD-10-CM | POA: Diagnosis not present

## 2021-11-13 DIAGNOSIS — R778 Other specified abnormalities of plasma proteins: Secondary | ICD-10-CM

## 2021-11-13 DIAGNOSIS — E039 Hypothyroidism, unspecified: Secondary | ICD-10-CM | POA: Diagnosis not present

## 2021-11-13 NOTE — Patient Instructions (Signed)
Medication Instructions:  No changes *If you need a refill on your cardiac medications before your next appointment, please call your pharmacy*   Lab Work: No Labs If you have labs (blood work) drawn today and your tests are completely normal, you will receive your results only by: [place] (if you have MyChart) OR A paper copy in the mail If you have any lab test that is abnormal or we need to change your treatment, we will call you to review the results.   Testing/Procedures: No Testing   Follow-Up: At [facility], you and your health needs are our priority.  As part of our continuing mission to provide you with exceptional heart care, we have created designated Provider Care Teams.  These Care Teams include your primary Cardiologist (physician) and Advanced Practice Providers (APPs -  Physician Assistants and Nurse Practitioners) who all work together to provide you with the care you need, when you need it.  We recommend signing up for the patient portal called "MyChart".  Sign up information is provided on this After Visit Summary.  MyChart is used to connect with patients for Virtual Visits (Telemedicine).  Patients are able to view lab/test results, encounter notes, upcoming appointments, etc.  Non-urgent messages can be sent to your provider as well.   To learn more about what you can do with MyChart, go to [email or web address].    Your next appointment:   1 year(s)  The format for your next appointment:   In Person  Provider:   Kirk Ruths, MD     Other Instructions Monitor Blood Pressure Daily. Call office if Blood pressure over 130/80 on consistent basis.

## 2021-11-13 NOTE — Telephone Encounter (Signed)
1.Medication Requested: traMADol (ULTRAM) 50 MG tablet  2. Pharmacy (Name, Street, [place]): [facility] [phone] - [place], [place] - [address] AT [address]  3. On Med List: yes   4. Last Visit with PCP:  05-27-2021  5. Next visit date with PCP: n/a   Offered patient an appt, patient declined

## 2021-11-13 NOTE — Progress Notes (Signed)
Office Visit    Patient Name: Jacqueline Wyatt Date of Encounter: 11/13/2021  Primary Care Provider:  Georgina Quint, MD Primary Cardiologist:  Olga Millers, MD  Chief Complaint    76 year old female with a history of chest pain, elevated troponin with no apparent CAD on cardiac catheterization, hyperlipidemia, hypothyroidism, and fibromyalgia presents for follow-up related to chest pain and hyperlipidemia.   Past Medical History    Past Medical History:  Diagnosis Date   Allergy    Arthritis    Asthma    Chest pain of uncertain etiology 03/25/2021   Fibromyalgia    Fibromyalgia muscle pain    Neuromuscular disorder (HCC)    Osteoporosis    Thyroid disease    Past Surgical History:  Procedure Laterality Date   LEFT HEART CATH AND CORONARY ANGIOGRAPHY N/A 03/24/2021   Procedure: LEFT HEART CATH AND CORONARY ANGIOGRAPHY;  Surgeon: Corky Crafts, MD;  Location: [facility];  Service: Cardiovascular;  Laterality: N/A;    Allergies  Allergies  Allergen Reactions   Almotriptan Malate     Chest pain   Banana    Eggs Or Egg-Derived Products     Stomach pain   Imitrex [Sumatriptan Base]    Penicillins    Relpax [Eletriptan Hydrobromide] Hypertension   Sulfa Drugs Cross Reactors    Vantin     dizziness   Watermelon Flavor    Azithromycin Anxiety    History of Present Illness    76 year old female with above past medical history including chest pain, elevated troponin with no apparent CAD on cardiac catheterization, hyperlipidemia, hypothyroidism, and fibromyalgia.  She was admitted to the hospital in May 2022 in the setting of elevated troponin consistent with NSTEMI. However, cardiac catheterization at the time showed normal coronaries, EF 55 to 60%.  Echocardiogram showed EF 60 to 65%, mild LVH, RVSP 39.4 mmHg, no significant valve issues. No further cardiac work-up was recommended and she was discharged home on aspirin and statin.  She was last seen  in the office on 04/20/2021 was doing well from a cardiac standpoint with no symptoms concerning for angina.   She presents today for follow-up.  Since her last visit she has been stable from a cardiac standpoint. She denies any symptoms concerning for angina. She does report chronic back pain in the setting of fibromyalgia, for which she takes tramadol.  Despite her chronic pain, she is trying to increase her daily activity by walking more. She is not taking her Valsartan, or rosuvastatin. Her BP is elevated in office today, though her home BP readings are consistently in the 110s/60s. Other than her chronic pain, she has no other concerns or complaints today.   Home Medications    Current Outpatient Medications  Medication Sig Dispense Refill   aspirin EC 81 MG EC tablet Take 1 tablet (81 mg total) by mouth daily. Swallow whole. 30 tablet 11   B Complex Vitamins (VITAMIN B COMPLEX PO) Take 1 tablet by mouth daily.     Coenzyme Q10 50 MG CHEW Chew 100 mg by mouth daily.     levothyroxine (SYNTHROID) 50 MCG tablet TAKE 1 TABLET BY MOUTH EVERY DAY BEFORE BREAKFAST 90 tablet 3   loratadine (CLARITIN) 10 MG tablet Take 10 mg by mouth daily.     traMADol (ULTRAM) 50 MG tablet Take 50 mg by mouth daily as needed.     Vitamin D, Ergocalciferol, (DRISDOL) 1.25 MG (50000 UNIT) CAPS capsule Take 1 capsule (50,000 Units  total) by mouth every 7 (seven) days. 30 capsule 12   COVID-19 mRNA bivalent vaccine, Pfizer, (PFIZER COVID-19 VAC BIVALENT) injection Inject into the muscle. (Patient not taking: Reported on 11/13/2021) 0.3 mL 0   COVID-19 mRNA vaccine, Pfizer, 30 MCG/0.3ML injection INJECT AS DIRECTED (Patient not taking: Reported on 11/13/2021) .3 mL 0   No current facility-administered medications for this visit.     Review of Systems    She denies chest pain, palpitations, dyspnea, pnd, orthopnea, n, v, dizziness, syncope, edema, weight gain, or early satiety. All other systems reviewed and are  otherwise negative except as noted above.   Physical Exam    VS:  BP (!) 150/88    Pulse 80    Ht 5\' 5"  (1.651 m)    Wt 195 lb 6.4 oz (88.6 kg)    SpO2 99%    BMI 32.52 kg/m   GEN: Well nourished, well developed, in no acute distress. HEENT: normal. Neck: Supple, no JVD, carotid bruits, or masses. Cardiac: RRR, no murmurs, rubs, or gallops. No clubbing, cyanosis, edema.  Radials/DP/PT 2+ and equal bilaterally.  Respiratory:  Respirations regular and unlabored, clear to auscultation bilaterally. GI: Soft, nontender, nondistended, BS + x 4. MS: no deformity or atrophy. Skin: warm and dry, no rash. Neuro:  Strength and sensation are intact. Psych: Normal affect.  Accessory Clinical Findings    ECG personally reviewed by me today - NSR, 80 bpm- no acute changes.  Lab Results  Component Value Date   WBC 7.6 03/25/2021   HGB 12.8 03/25/2021   HCT 37.1 03/25/2021   MCV 95.6 03/25/2021   PLT 197 03/25/2021   Lab Results  Component Value Date   CREATININE 1.02 05/27/2021   BUN 18 05/27/2021   NA 137 05/27/2021   K 4.3 05/27/2021   CL 103 05/27/2021   CO2 27 05/27/2021   Lab Results  Component Value Date   ALT 21 05/27/2021   AST 27 05/27/2021   ALKPHOS 68 05/27/2021   BILITOT 0.4 05/27/2021   Lab Results  Component Value Date   CHOL 196 05/27/2021   HDL 62.30 05/27/2021   LDLCALC 99 05/27/2021   TRIG 173.0 (H) 05/27/2021   CHOLHDL 3 05/27/2021    Lab Results  Component Value Date   HGBA1C 5.7 (H) 03/24/2021    Assessment & Plan    1. H/o elevated troponin without evidence of CAD: Elevated troponin consistent with non-STEMI in 02/2021, however, cath at the time showed normal coronaries, EF 55 to 60%. Echocardiogram showed EF 60 to 65%, mild LVH, RVSP 39.4 mmHg, no significant valve issues. Stable with no anginal symptoms. No indication for ischemic evaluation. She is not taking her valsartan or rosuvastatin. She states she does not need BP medication as her BP is well  controlled at home (though it is always elevated when she goes to the doctor). She also states that her cholesterol was normal, and given the risk for myalgias while taking statins, and history of fibromyalgia, she declines statin therapy.   2. Elevated blood pressure reading: As above. She will continue to monitor home BP and report BP consistently > 130/80.   3. Hyperlipidemia: LDL was 99 on 05/27/21.  She is no longer taking Crestor as above.  Managed and monitored by PCP.  4. Hypothyroidism: TSH 3.630 on 11/17/20, monitored and managed by PCP. Continue levothyroxine.   5. Disposition: Follow-up in 1 year.   11/19/20, NP 11/13/2021, 4:04 PM

## 2021-11-17 MED ORDER — TRAMADOL HCL 50 MG PO TABS: 50.0000 mg | ORAL_TABLET | Freq: Every day | ORAL | 0 refills | Status: DC | PRN

## 2021-11-17 NOTE — Telephone Encounter (Signed)
Patient is requesting a refill of the following medications: Requested Prescriptions   Pending Prescriptions Disp Refills   traMADol (ULTRAM) 50 MG tablet 30 tablet 0    Sig: Take 1 tablet (50 mg total) by mouth daily as needed.    Date of patient request: 11/13/21  Last office visit: 05/27/21  Date of last refill: 10/15/21 Last refill amount: 30 Follow up time period per chart: n/a

## 2021-12-16 ENCOUNTER — Ambulatory Visit: Payer: Medicare Other | Admitting: Emergency Medicine

## 2022-02-18 ENCOUNTER — Ambulatory Visit (HOSPITAL_COMMUNITY)
Admission: EM | Admit: 2022-02-18 | Discharge: 2022-02-18 | Disposition: A | Discharge status: Home / Self Care | Payer: Medicare Other | Attending: Internal Medicine | Admitting: Internal Medicine

## 2022-02-18 ENCOUNTER — Encounter (HOSPITAL_COMMUNITY): Payer: Self-pay | Admitting: Emergency Medicine

## 2022-02-18 DIAGNOSIS — L237 Allergic contact dermatitis due to plants, except food: Secondary | ICD-10-CM | POA: Diagnosis not present

## 2022-02-18 MED ORDER — TRIAMCINOLONE ACETONIDE 0.1 % EX CREA: 1.0000 "application " | TOPICAL_CREAM | Freq: Two times a day (BID) | CUTANEOUS | 0 refills | Status: DC

## 2022-02-18 NOTE — ED Triage Notes (Signed)
Pt reports week ago was working in her yard. Got into poison ivy on lower legs and arms. Reports itching.  ?

## 2022-02-18 NOTE — Discharge Instructions (Addendum)
Use the prescribed cream on affected areas twice daily as needed.  Cool compresses may also provide relief.  Someone from the clinic will reach out to you to help you establish a primary care provider.  Otherwise, please follow-up for any persistent or new symptoms. ?

## 2022-02-18 NOTE — ED Provider Notes (Signed)
?[facility] ? ? ? ?CSN: [id number] ?Arrival date & time: 02/18/22  1655 ? ? ?  ? ?History   ?Chief Complaint ?No chief complaint on file. ? ? ?HPI ?Jacqueline Wyatt is a 76 y.o. female reports working in her backyard a week ago and developing rash on lower legs and forearms.  Patient reports noticing a segment of poison ivy in yard after rash appeared.  Describes rash as itchy.  She denies any pain, no recent fever or chills.  Patient has not tried anything on affected areas. ? ? ?Past Medical History:  ?Diagnosis Date  ? Allergy   ? Arthritis   ? Asthma   ? Chest pain of uncertain etiology 99991111  ? Fibromyalgia   ? Fibromyalgia muscle pain   ? Neuromuscular disorder ([place])   ? Osteoporosis   ? Thyroid disease   ? ? ?Patient Active Problem List  ? Diagnosis Date Noted  ? Essential hypertension 05/27/2021  ? Chest pain of uncertain etiology [id number]  ? Elevated troponin level not due myocardial infarction 03/24/2021  ? Elevated troponin   ? Stage 3a chronic kidney disease ([place]) 11/07/2019  ? Dyslipidemia 11/07/2019  ? Chronic pain syndrome 08/05/2017  ? Vitamin D deficiency 08/05/2017  ? Generalized anxiety disorder 09/09/2014  ? Hypothyroidism 11/24/2011  ? Migraine 11/24/2011  ? Environmental allergies 11/24/2011  ? Fibromyalgia 11/24/2011  ? Irritable bowel syndrome (IBS) 11/24/2011  ? Osteoporosis 11/24/2011  ? ? ?Past Surgical History:  ?Procedure Laterality Date  ? LEFT HEART CATH AND CORONARY ANGIOGRAPHY N/A 03/24/2021  ? Procedure: LEFT HEART CATH AND CORONARY ANGIOGRAPHY;  Surgeon: Jettie Booze, MD;  Location: [facility];  Service: Cardiovascular;  Laterality: N/A;  ? ? ?OB History   ?No obstetric history on file. ?  ? ? ? ?Home Medications   ? ?Prior to Admission medications   ?Medication Sig Start Date End Date Taking? Authorizing Provider  ?triamcinolone cream (KENALOG) 0.1 % Apply 1 application. topically 2 (two) times daily. 02/18/22  Yes Rudolpho Sevin, NP  ?aspirin EC 81 MG EC  tablet Take 1 tablet (81 mg total) by mouth daily. Swallow whole. 03/25/21   Barrett, Evelene Croon, PA-C  ?B Complex Vitamins (VITAMIN B COMPLEX PO) Take 1 tablet by mouth daily.    [provider]  ?Coenzyme Q10 50 MG CHEW Chew 100 mg by mouth daily.    [provider]  ?COVID-19 mRNA bivalent vaccine, Pfizer, (PFIZER COVID-19 VAC BIVALENT) injection Inject into the muscle. ?Patient not taking: Reported on 11/13/2021 11/05/21   Carlyle Basques, MD  ?levothyroxine (SYNTHROID) 50 MCG tablet TAKE 1 TABLET BY MOUTH EVERY DAY BEFORE BREAKFAST 07/20/21   Horald Pollen, MD  ?loratadine (CLARITIN) 10 MG tablet Take 10 mg by mouth daily.    [provider]  ?traMADol (ULTRAM) 50 MG tablet Take 1 tablet (50 mg total) by mouth daily as needed. 11/17/21   Horald Pollen, MD  ?Vitamin D, Ergocalciferol, (DRISDOL) 1.25 MG (50000 UNIT) CAPS capsule Take 1 capsule (50,000 Units total) by mouth every 7 (seven) days. 11/17/20   Just, Laurita Quint, FNP  ? ? ?Family History ?Family History  ?Problem Relation Age of Onset  ? Heart disease Mother   ? Hypertension Mother   ? Hyperlipidemia Mother   ? Hearing loss Mother   ? Kidney disease Mother   ? Vision loss Mother   ? Lung disease Mother   ? Stroke Father   ? Arthritis Sister   ?  Meniere's disease Brother   ? Cancer Maternal Grandmother   ? Stroke Maternal Grandmother   ? Heart disease Maternal Grandfather   ? Heart disease Maternal Uncle   ? ? ?Social History ?Social History  ? ?Tobacco Use  ? Smoking status: Never  ? Smokeless tobacco: Never  ? ? ? ?Allergies   ?Almotriptan malate, Banana, Eggs or egg-derived products, Imitrex [sumatriptan base], Penicillins, Relpax [eletriptan hydrobromide], Sulfa drugs cross reactors, Vantin, Watermelon flavor, and Azithromycin ? ? ?Review of Systems ?As stated in HPI otherwise negative ? ? ?Physical Exam ?Triage Vital Signs ?ED Triage Vitals  ?Enc Vitals Group  ?   BP 02/18/22 1727 137/77  ?   Pulse Rate 02/18/22 1727  76  ?   Resp 02/18/22 1727 19  ?   Temp 02/18/22 1727 98.4 ?F (36.9 ?C)  ?   Temp Source 02/18/22 1727 Oral  ?   SpO2 02/18/22 1727 99 %  ?   Weight --   ?   Height --   ?   Head Circumference --   ?   Peak Flow --   ?   Pain Score 02/18/22 1723 0  ?   Pain Loc --   ?   Pain Edu? --   ?   Excl. in [place]? --   ? ?No data found. ? ?Updated Vital Signs ?BP 137/77 (BP Location: Left Arm)   Pulse 76   Temp 98.4 ?F (36.9 ?C) (Oral)   Resp 19   SpO2 99%  ? ?Visual Acuity ?Right Eye Distance:   ?Left Eye Distance:   ?Bilateral Distance:   ? ?Right Eye Near:   ?Left Eye Near:    ?Bilateral Near:    ? ?Physical Exam ?Constitutional:   ?   General: She is not in acute distress. ?   Appearance: Normal appearance. She is not ill-appearing or toxic-appearing.  ?HENT:  ?   Mouth/Throat:  ?   Mouth: Mucous membranes are moist.  ?Eyes:  ?   Extraocular Movements: Extraocular movements intact.  ?Musculoskeletal:  ?   Cervical back: Normal range of motion.  ?Skin: ?   General: Skin is warm and dry.  ?   Comments: Scattered erythematous lesions on medial aspect of left calf and anterior portion of right leg.  Some lesions scabbing over.  Few smaller lesions on right anterior forearm.  No drainage  ?Neurological:  ?   General: No focal deficit present.  ?   Mental Status: She is alert and oriented to person, place, and time.  ?Psychiatric:     ?   Mood and Affect: Mood normal.     ?   Behavior: Behavior normal.     ?   Thought Content: Thought content normal.  ? ? ? ?UC Treatments / Results  ?Labs ?(all labs ordered are listed, but only abnormal results are displayed) ?Labs Reviewed - No data to display ? ?EKG ? ? ?Radiology ?No results found. ? ?Procedures ?Procedures (including critical care time) ? ?Medications Ordered in UC ?Medications - No data to display ? ?Initial Impression / Assessment and Plan / UC Course  ?I have reviewed the triage vital signs and the nursing notes. ? ?Pertinent labs & imaging results that were available  during my care of the patient were reviewed by me and considered in my medical decision making (see chart for details). ? ?Poison Ivy ?-Would prefer to avoid oral steroids given patient's medical history.  Symptoms are mild at this point ?-Triamcinolone  cream twice daily as needed ?-Cool compresses as needed for relief ?-Follow-up for persistent or worsening symptoms ? ?Reviewed expections re: course of current medical issues. Questions answered. ?Outlined signs and symptoms indicating need for more acute intervention. ?Pt verbalized understanding. ?AVS given ? ? ?Final Clinical Impressions(s) / UC Diagnoses  ? ?Final diagnoses:  ?Poison ivy  ? ? ? ?Discharge Instructions   ? ?  ?Use the prescribed cream on affected areas twice daily as needed.  Cool compresses may also provide relief.  Someone from the clinic will reach out to you to help you establish a primary care provider.  Otherwise, please follow-up for any persistent or new symptoms. ? ? ? ?ED Prescriptions   ? ? Medication Sig Dispense Auth. Provider  ? triamcinolone cream (KENALOG) 0.1 % Apply 1 application. topically 2 (two) times daily. 30 g Rudolpho Sevin, NP  ? ?  ? ?PDMP not reviewed this encounter. ?  ?Rudolpho Sevin, NP ?02/18/22 1829 ? ?

## 2022-02-18 NOTE — ED Notes (Signed)
Pt reports was a patient at [facility] but has since closed and needing referral to a PCP.  ?

## 2022-05-03 IMAGING — DX DG FOOT COMPLETE 3+V*R*
3 series · 3 of 3 positions shown · non-contrast
Comparison: None.

CLINICAL DATA: Pain and bruising

EXAM:
RIGHT FOOT COMPLETE - 3+ VIEW

[foot ap]
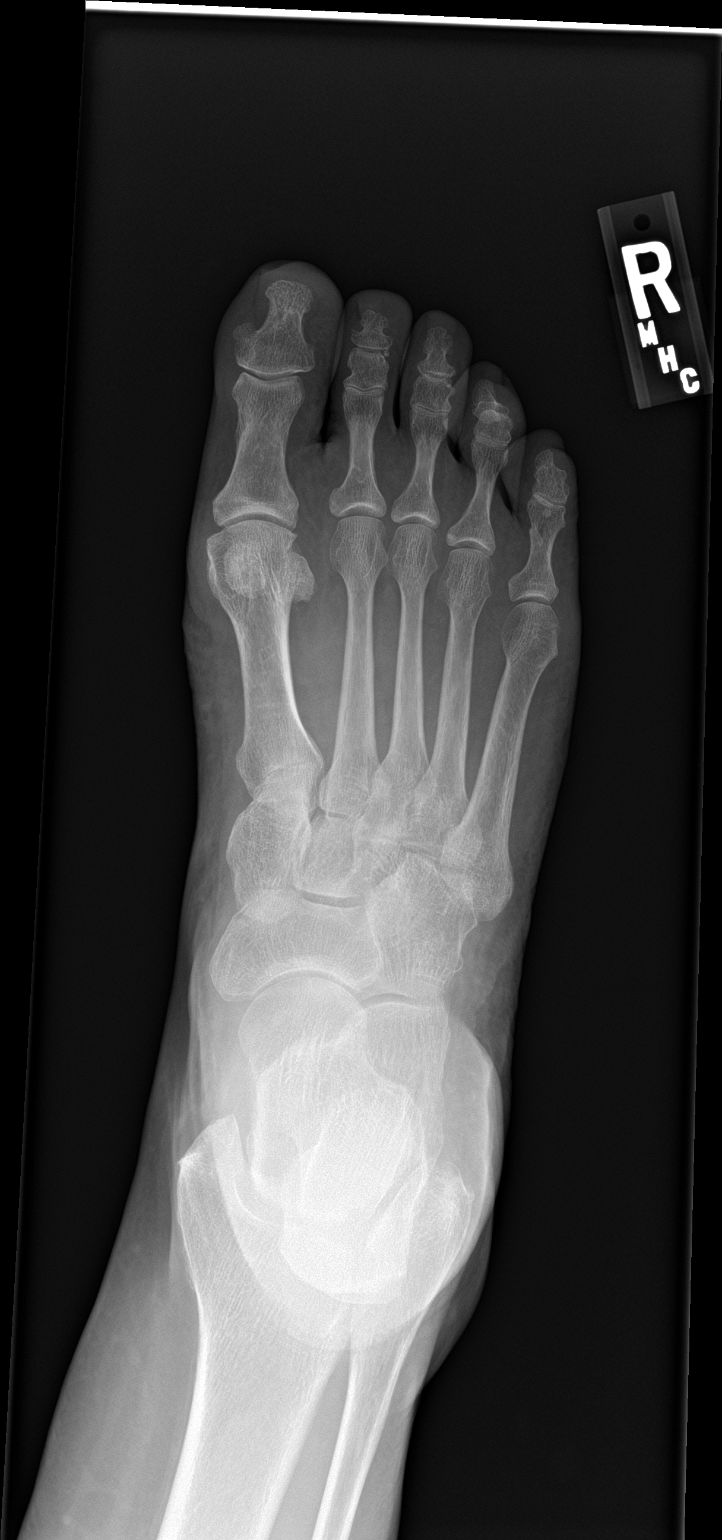

[foot obl]
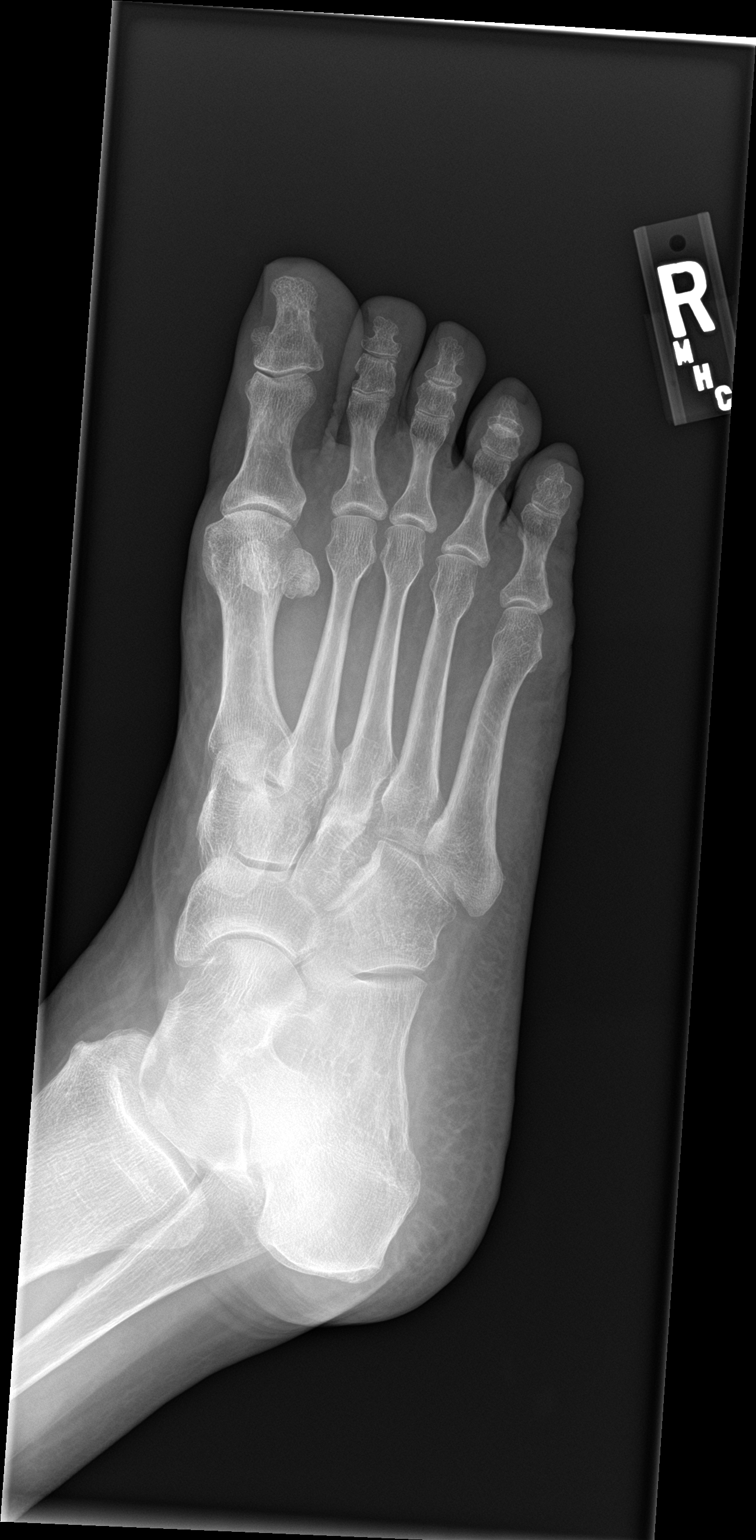

[foot lat]
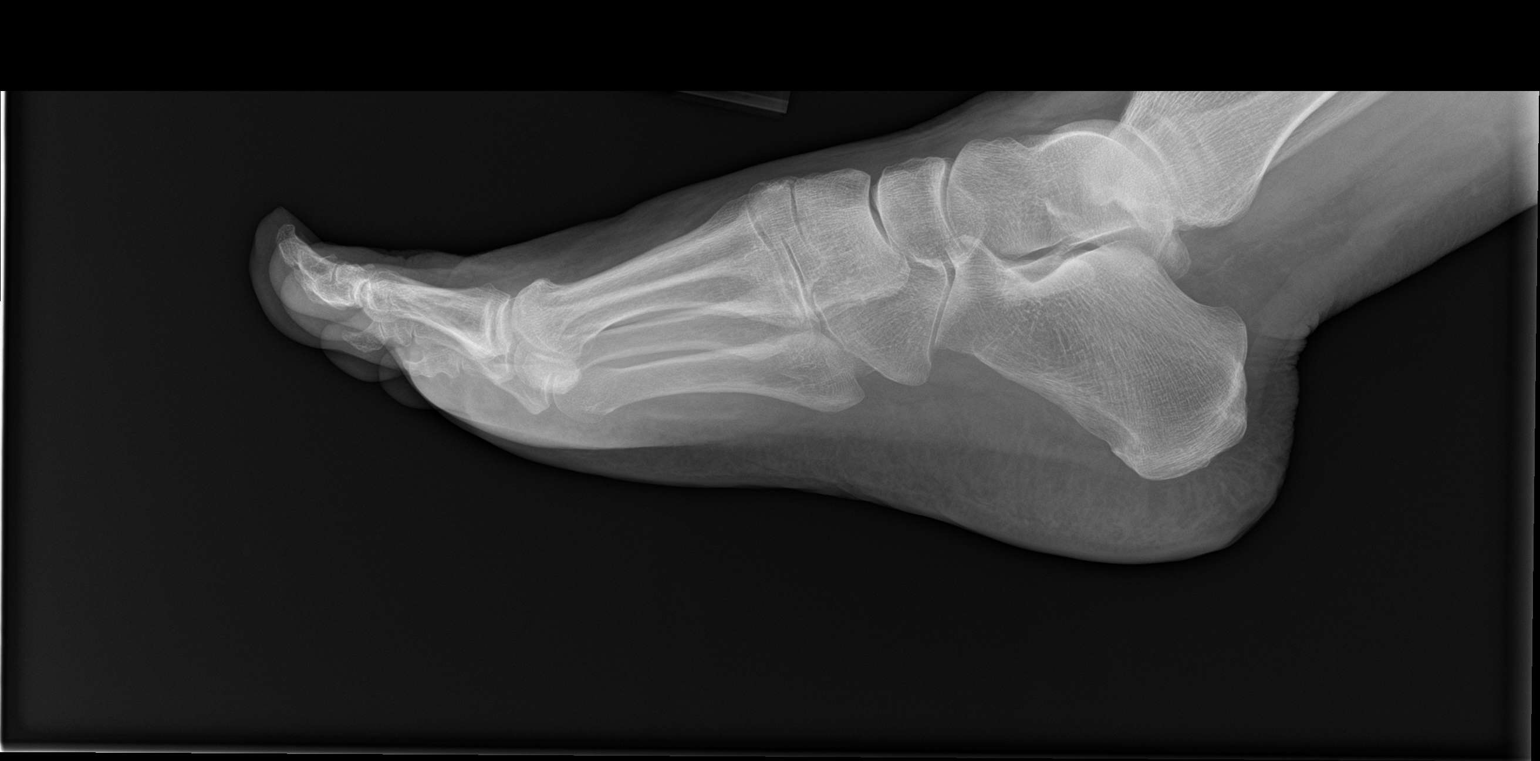

[3 of 3 positions shown; findings below may reference images not displayed]

FINDINGS: Acute nondisplaced fracture involving the midportion of fifth
proximal phalanx. No malalignment. Positive for soft tissue
swelling. Mild degenerative change at the first MTP joint.
IMPRESSION: Acute nondisplaced fracture involving the fifth proximal phalanx

## 2022-07-29 ENCOUNTER — Ambulatory Visit (INDEPENDENT_AMBULATORY_CARE_PROVIDER_SITE_OTHER): Payer: Medicare Other | Admitting: Family Medicine

## 2022-07-29 ENCOUNTER — Other Ambulatory Visit: Payer: Self-pay

## 2022-07-29 ENCOUNTER — Encounter: Payer: Self-pay | Admitting: Family Medicine

## 2022-07-29 VITALS — BP 111/67 | HR 93 | Temp 98.4°F | Resp 32 | Ht 66.0 in | Wt 190.9 lb

## 2022-07-29 DIAGNOSIS — E039 Hypothyroidism, unspecified: Secondary | ICD-10-CM | POA: Diagnosis not present

## 2022-07-29 DIAGNOSIS — M79671 Pain in right foot: Secondary | ICD-10-CM

## 2022-07-29 DIAGNOSIS — E785 Hyperlipidemia, unspecified: Secondary | ICD-10-CM

## 2022-07-29 DIAGNOSIS — I10 Essential (primary) hypertension: Secondary | ICD-10-CM | POA: Diagnosis not present

## 2022-07-29 DIAGNOSIS — M797 Fibromyalgia: Secondary | ICD-10-CM

## 2022-07-29 DIAGNOSIS — E559 Vitamin D deficiency, unspecified: Secondary | ICD-10-CM | POA: Diagnosis not present

## 2022-07-29 HISTORY — DX: Pain in right foot: M79.671

## 2022-07-29 MED ORDER — DICLOFENAC SODIUM 1 % EX GEL: 2.0000 g | Freq: Four times a day (QID) | CUTANEOUS | 0 refills | Status: DC

## 2022-07-29 NOTE — Patient Instructions (Addendum)
Lab work was done today including BMP, lipid profile, TSH and vitamin D. We will discuss the lab results as soon as they become available Continue levothyroxine Follow-up in 2 weeks to discuss treatment option for fibromyalgia.

## 2022-07-29 NOTE — Assessment & Plan Note (Addendum)
Patient has establish diagnosis for hypertension however she has never been on antihypertensive medication.  Patient monitors blood pressure at home and she brought a blood pressure log with her.  Blood pressure readings were within normal limits except 1 or 2 readings where diastolic blood pressure was slightly higher.  Patient was educated to continue monitor the blood pressure at home.  We will continue to monitor the patient off antihypertensives

## 2022-07-29 NOTE — Progress Notes (Signed)
CC: Establish primary care and right heel pain  HPI: Jacqueline Wyatt is a 76 y.o. female with past medical history listed below presenting to [facility] to establish primary care and right heel pain.   Patient reports right heel pain since July,2023.  Pain is on and off, worse at times.  Denies injury or trauma.  Patient not taking anything to help with the pain. Patient has history of hypertension however she has never been on a prescription medication to control blood pressure.  Patient brought a blood pressure log with her which has a blood pressure reading for over a month.  The blood pressure readings were within normal limits except 1 or 2 incidents where diastolic blood pressure was around 95-100.  Blood pressure in the clinic today 111/67.  For details of today's visit and the status of his chronic medical issues please refer to the assessment and plan.   Past Medical History:  Diagnosis Date   Allergy    Arthritis    Asthma    Chest pain of uncertain etiology 03/25/2021   Fibromyalgia    Fibromyalgia muscle pain    Neuromuscular disorder (HCC)    Osteoporosis    Thyroid disease    Review of Systems: Review of Systems  Constitutional: Negative.   Respiratory:  Negative for shortness of breath.   Cardiovascular:  Negative for chest pain.  Musculoskeletal:  Positive for joint pain (Right heel pain).  Neurological:  Negative for dizziness.  Psychiatric/Behavioral: Negative.       Physical Exam: Physical Exam Vitals and nursing note reviewed.  Constitutional:      Appearance: Normal appearance.  HENT:     Head: Normocephalic.  Eyes:     Conjunctiva/sclera: Conjunctivae normal.  Cardiovascular:     Rate and Rhythm: Normal rate and regular rhythm.     Pulses: Normal pulses.     Heart sounds: Normal heart sounds.  Pulmonary:     Effort: Pulmonary effort is normal. No respiratory distress.     Breath sounds: Normal breath sounds. No wheezing, rhonchi or rales.  Musculoskeletal:         General: Tenderness (Tenderness to palpation to medial/lateral aspect of right heel.  No visible redness or swelling.  No tenderness to sole of the foot.  No tenderness to plantar fascia.  Full range of motion in the foot.  Pedal pulse intact.  Neurovascular intact) present. No swelling, deformity or signs of injury.     Cervical back: Neck supple.     Right lower leg: No edema.     Right foot: Normal range of motion. No deformity, Charcot foot or foot drop.       Feet:  Feet:     Right foot:     Skin integrity: No erythema, warmth or dry skin.     Comments: Tenderness to palpation to medial aspect of right heel.  No visible redness or swelling.  No visible deformity.  Full range of motion in the foot.  Neurovascular intact. Skin:    General: Skin is warm.     Capillary Refill: Capillary refill takes less than 2 seconds.     Findings: No erythema or rash.  Neurological:     Mental Status: She is alert and oriented to person, place, and time.  Psychiatric:        Mood and Affect: Mood normal.        Behavior: Behavior normal.      Vitals:   07/29/22 1601  BP: 111/67  Pulse: 93  Resp: (!) 32  Temp: 98.4 F (36.9 C)  TempSrc: Oral  SpO2: 97%  Weight: 190 lb 14.4 oz (86.6 kg)  Height: 5\' 6"  (1.676 m)     Assessment & Plan:   See Encounters Tab for problem based charting.  Patient seen with Dr. Dareen Piano

## 2022-07-29 NOTE — Assessment & Plan Note (Signed)
Patient has chronic fatigue.  Patient were taking tramadol to help with fatigue and pain associated fibromyalgia. Patient was given education material to read at home.  We will talk about fibromyalgia treatment options on next visit.  Patient agrees with the plan of care.

## 2022-07-29 NOTE — Assessment & Plan Note (Signed)
Patient's lipid panel reflected LDL of 99 in August, 2022.  Patient do not want to be on a statin. Plan: Lipid panel drawn today. We will discuss the lab results with the patient.

## 2022-07-29 NOTE — Assessment & Plan Note (Signed)
Patient is on levothyroxine 50 mcg.  She is taking her medication regularly.  Her TSH in January, 2022 was 3.6 which was within normal limits.  We will draw the lab for TSH today.  Plan: TSH lab draw Continue: Levothyroxine 50 mcg

## 2022-07-29 NOTE — Assessment & Plan Note (Addendum)
Patient has history of vitamin D deficiency.  Patient were taking prescription strength vitamin D. Plan: Vitamin D blood draw. We will discuss treatment with the patient after the lab results.

## 2022-07-30 LAB — LIPID PANEL
Chol/HDL Ratio: 3.2 ratio (ref 0.0–4.4)
Cholesterol, Total: 209 mg/dL — ABNORMAL HIGH (ref 100–199)
HDL: 66 mg/dL (ref >39)
LDL Chol Calc (NIH): 101 mg/dL — ABNORMAL HIGH (ref 0–99)
Triglycerides: 252 mg/dL — ABNORMAL HIGH (ref 0–149)
VLDL Cholesterol Cal: 42 mg/dL — ABNORMAL HIGH (ref 5–40)

## 2022-07-30 LAB — BMP8+ANION GAP
Anion Gap: 17 mmol/L (ref 10.0–18.0)
BUN/Creatinine Ratio: 12 (ref 12–28)
BUN: 13 mg/dL (ref 8–27)
CO2: 22 mmol/L (ref 20–29)
Calcium: 9.9 mg/dL (ref 8.7–10.3)
Chloride: 101 mmol/L (ref 96–106)
Creatinine, Ser: 1.08 mg/dL — ABNORMAL HIGH (ref 0.57–1.00)
Glucose: 141 mg/dL — ABNORMAL HIGH (ref 70–99)
Potassium: 4.5 mmol/L (ref 3.5–5.2)
Sodium: 140 mmol/L (ref 134–144)
eGFR: 53 mL/min/{1.73_m2} — ABNORMAL LOW (ref >59)

## 2022-07-30 LAB — VITAMIN D 25 HYDROXY (VIT D DEFICIENCY, FRACTURES): Vit D, 25-Hydroxy: 29.2 ng/mL — ABNORMAL LOW (ref 30.0–100.0)

## 2022-07-30 LAB — TSH: TSH: 3.46 u[IU]/mL (ref 0.450–4.500)

## 2022-07-30 NOTE — Addendum Note (Signed)
Addended by: Aldine Contes on: 07/30/2022 03:13 PM   Modules accepted: Level of Service

## 2022-07-30 NOTE — Progress Notes (Signed)
Internal Medicine Clinic Attending  I saw and evaluated the patient.  I personally confirmed the key portions of the history and exam documented by Dr. Multani and I reviewed pertinent patient test results.  The assessment, diagnosis, and plan were formulated together and I agree with the documentation in the resident's note.  

## 2022-08-03 ENCOUNTER — Other Ambulatory Visit: Payer: Self-pay | Admitting: Family Medicine

## 2022-08-03 DIAGNOSIS — E559 Vitamin D deficiency, unspecified: Secondary | ICD-10-CM

## 2022-08-03 MED ORDER — VITAMIN D (ERGOCALCIFEROL) 1.25 MG (50000 UNIT) PO CAPS: 50000.0000 [IU] | ORAL_CAPSULE | ORAL | 0 refills | Status: AC

## 2022-08-03 NOTE — Progress Notes (Signed)
Prescription for vitamin D was sent to the pharmacy on file.  Patient was made aware.

## 2022-08-12 ENCOUNTER — Ambulatory Visit (HOSPITAL_COMMUNITY)
Admission: RE | Admit: 2022-08-12 | Discharge: 2022-08-12 | Disposition: A | Discharge status: Home / Self Care | Payer: Medicare Other | Source: Ambulatory Visit | Attending: Student in an Organized Health Care Education/Training Program | Admitting: Student in an Organized Health Care Education/Training Program

## 2022-08-12 ENCOUNTER — Ambulatory Visit (INDEPENDENT_AMBULATORY_CARE_PROVIDER_SITE_OTHER): Payer: Medicare Other | Admitting: Family Medicine

## 2022-08-12 ENCOUNTER — Encounter: Payer: Self-pay | Admitting: Family Medicine

## 2022-08-12 VITALS — BP 120/87 | HR 88 | Temp 98.2°F | Ht 66.0 in | Wt 191.0 lb

## 2022-08-12 DIAGNOSIS — S9031XA Contusion of right foot, initial encounter: Secondary | ICD-10-CM

## 2022-08-12 DIAGNOSIS — M79671 Pain in right foot: Secondary | ICD-10-CM

## 2022-08-12 DIAGNOSIS — M797 Fibromyalgia: Secondary | ICD-10-CM | POA: Diagnosis not present

## 2022-08-12 MED ORDER — DULOXETINE HCL 30 MG PO CPEP: 30.0000 mg | ORAL_CAPSULE | Freq: Every day | ORAL | 1 refills | Status: DC

## 2022-08-12 NOTE — Assessment & Plan Note (Signed)
Patient with established history of fibromyalgia.  Patient complains of generalized body aches, muscle ache, fatigue and stiffness in the joints.  Patient were taking tramadol 50 mg 1 tablet daily.  Patient stated tramadol is adequately treating her symptoms.  On her previous visit work-up was done to rule out anemia, hypothyroidism and vitamin D deficiency.   Lab work revealed vitamin D insufficiency otherwise the lab work was pretty unremarkable.  Patient were prescribed vitamin D.  Patient still have to pick up the medication from the pharmacy.  On the previous visit patient was advised to start duloxetine instead of tramadol.  Patient stated she was not ready for the switch.Patient was also given information on fibromyalgia to read.  Upon presenting to the clinic today we spoke about treatment plan for fibromyalgia.  Patient agreed to start duloxetine.  We will start with duloxetine 30 mg once a day.  Follow-up in 4 weeks to evaluate medication efficacy or potential side effects. Plan: -Start duloxetine -Stop tramadol

## 2022-08-12 NOTE — Assessment & Plan Note (Addendum)
  Patient complaining of right foot pain, swelling and bruising for 4 days.  Patient reports she accidentally dropped a stack of books on her right foot.  Pain to the top of right foot, pressure and achy in nature, nonradiating, worse with ambulation.  Patient denies tingling, numbness or weakness in the foot.  Slight limp with ambulation.  Patient favoring left leg during ambulation.  It appears a contusion however we will do foot x-ray to rule out fracture.  Cam walker applied in the clinic.  Tylenol, ibuprofen as needed for pain.  Ice pack 15 to 20 minutes 3-4 times a day to help with the swelling.  Keep foot elevated.  Avoid prolonged standing and weightbearing. Plan: -X-ray right foot -CAM Walker applied

## 2022-08-12 NOTE — Patient Instructions (Signed)
Foot x-ray was done CAM Walker applied.  Ice pack 15 to 20 minutes 3-4 times a day.  Keep foot elevated.  Avoid weightbearing and prolonged standing.  Tylenol, ibuprofen as needed for pain. I will call with the x-ray results as soon as it becomes available Start duloxetine.  The prescription was sent to the pharmacy. Follow-up in 4 weeks

## 2022-08-12 NOTE — Progress Notes (Signed)
   CC: Right foot pain and management of fibromyalgia  HPI: [name] is a 76 y.o. female with past medical history listed below presenting to [facility] for evaluation of right foot pain and management of fibromyalgia. For details of today's visit and the status of his chronic medical issues please refer to the assessment and plan.   Patient complaining of right foot pain, swelling and bruising for 4 days.  Patient reports she accidentally dropped a stack of books on her right foot.  Pain to the top of right foot, pressure and achy in nature, nonradiating, worse with ambulation.  Patient denies tingling, numbness or weakness in the foot.  Patient with established history of fibromyalgia.  Patient complains of generalized body aches, muscle ache, fatigue and stiffness in the joints.  Patient were taking tramadol 50 mg 1 tablet daily.  Patient stated tramadol is adequately treating her symptoms.  On her previous visit work-up was done to rule out anemia, hypothyroidism and vitamin D deficiency.   Lab work revealed vitamin D insufficiency otherwise the lab work was pretty unremarkable.  Patient were prescribed vitamin D.  Patient still have to pick up the medication from the pharmacy.  On the previous visit patient was advised to start duloxetine instead of tramadol.  Patient stated she was not ready for the switch.Patient was also given information on fibromyalgia to read.  Upon presenting to the clinic today we spoke about treatment plan for fibromyalgia.  Patient agreed to start duloxetine.     Past Medical History:  Diagnosis Date   Allergy    Arthritis    Asthma    Chest pain of uncertain etiology 05/27/3824   Fibromyalgia    Fibromyalgia muscle pain    Neuromuscular disorder ([place])    Osteoporosis    Thyroid disease    Review of Systems:  Review of Systems  Constitutional:  Positive for malaise/fatigue.  Musculoskeletal:  Positive for joint pain (Right foot pain).     Physical Exam:Physical  Exam Vitals and nursing note reviewed.  Constitutional:      Appearance: Normal appearance.  Cardiovascular:     Rate and Rhythm: Normal rate and regular rhythm.  Pulmonary:     Effort: Pulmonary effort is normal.     Breath sounds: Normal breath sounds.  Musculoskeletal:        General: Swelling, tenderness (Pain, swelling, bruising to dorsum aspect of right foot.  Tenderness predominantly to second and third metatarsal bones.Full range of motion.  neurovascular intact.  Pedal pulse intact) and signs of injury present. No deformity.     Right foot: Normal range of motion. No deformity or foot drop.       Feet:  Feet:     Right foot:     Skin integrity: Skin breakdown (Superficial skin break.  No redness.  Skin warm to touch.  No signs of cellulitis/infection) present. No blister, erythema or warmth.  Neurological:     Mental Status: She is alert.      Vitals:   08/12/22 1432  BP: 120/87  Pulse: 88  Temp: 98.2 F (36.8 C)  TempSrc: Oral  SpO2: 97%  Weight: 191 lb (86.6 kg)  Height: 5\' 6"  (1.676 m)    Assessment & Plan:   See Encounters Tab for problem based charting.  Patient seen with Dr. Evette Doffing

## 2022-08-12 NOTE — Progress Notes (Addendum)
Consult Note   Patient: Jacqueline Wyatt             Date of Birth: [birth year]           MRN: [id number]             Visit Date: 08/12/2022  Requested by: Horald Pollen, MD [place],  [place] [id number] PCP: Teola Bradley, MD   Assessment & Plan: Visit Diagnoses:  1. Contusion of right foot, initial encounter   2. Fibromyalgia     Plan: A CAM walker was placed on the pt's RLE today for the above diagnoses.  Follow Up Instructions: Return in about 4 weeks (around 09/09/2022).  Orders: Orders Placed This Encounter  Procedures   DG Foot Complete Right   Apply cam walker   Meds ordered this encounter  Medications   DULoxetine (CYMBALTA) 30 MG capsule    Sig: Take 1 capsule (30 mg total) by mouth daily.    Dispense:  30 capsule    Refill:  1     Procedures: CAM boot applied to RLE without complication    Subjective:  Chief Complaint  Patient presents with   Foot Injury   Fibromyalgia     Jacqueline Wyatt presented to the [facility] today and was seen by Dr. Jeanett Schlein with a c/c of R foot pain, bruising, and swelling after dropping books on her foot 3-5 days prior. She states the pain is moderate when weight bearing and mild when NWB. She does ambulate with an assistive device: single-point cane. There is bruising and swelling present over the dorsolateral aspect of her R foot. Pt also c/o R heel pain that has been persistent "for the past couple of months". Pt notes the heel pain is the worst when standing up out of bed in the morning and remains mild-moderate throughout the day, especially when ambulating.   Relevant PMH includes a nondisplaced fifth proximal phalanx fx of her R foot on 06/11/2021. I provided her with a CAM walker and applied it to the RLE. Encouraged icing, elevation, and gentle compression with an ACE wrap or a taller sock if that is tolerable.  Foot Injury  The incident occurred 3 to 5 days ago. The incident occurred at home. Injury mechanism:  direct impact to the RLE. Pain severity now: mild-moderate. Pain course: constant, worsens with ambulation. Pertinent negatives include no loss of sensation, numbness or tingling. The symptoms are aggravated by movement, weight bearing and palpation.     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/07/2019   Dyslipidemia 11/07/2019   Chronic pain syndrome 08/05/2017   Vitamin D deficiency 08/05/2017   Generalized anxiety disorder 09/09/2014   Hypothyroidism 11/24/2011   Migraine 11/24/2011   Environmental allergies 11/24/2011   Fibromyalgia 11/24/2011   Irritable bowel syndrome (IBS) 11/24/2011   Osteoporosis 11/24/2011   Past Medical History:  Diagnosis Date   Allergy  Arthritis    Asthma    Chest pain of uncertain etiology 04/02/6294   Fibromyalgia    Fibromyalgia muscle pain    Neuromuscular disorder ([place])    Osteoporosis    Thyroid disease     Family History  Problem Relation Age of Onset   Heart disease Mother    Hypertension Mother    Hyperlipidemia Mother    Hearing loss Mother    Kidney disease Mother    Vision loss Mother    Lung disease Mother    Stroke Father    Arthritis Sister    Meniere's disease Brother    Cancer Maternal Grandmother    Stroke Maternal Grandmother    Heart disease Maternal Grandfather    Heart disease Maternal Uncle    Past Surgical History:  Procedure Laterality Date   LEFT HEART CATH AND CORONARY ANGIOGRAPHY N/A 03/24/2021   Procedure: LEFT HEART  CATH AND CORONARY ANGIOGRAPHY;  Surgeon: Jettie Booze, MD;  Location: [place] [facility];  Service: Cardiovascular;  Laterality: N/A;   Social History   Occupational History   Not on file  Tobacco Use   Smoking status: Never   Smokeless tobacco: Never  Substance and Sexual Activity   Alcohol use: Not on file   Drug use: Not on file   Sexual activity: Not on file

## 2022-08-13 NOTE — Progress Notes (Signed)
Internal Medicine Clinic Attending  I saw and evaluated the patient.  I personally confirmed the key portions of the history and exam documented by Dr. Multani and I reviewed pertinent patient test results.  The assessment, diagnosis, and plan were formulated together and I agree with the documentation in the resident's note.  

## 2022-08-13 NOTE — Addendum Note (Signed)
Addended by: Lalla Brothers T on: 08/13/2022 08:39 AM   Modules accepted: Level of Service

## 2022-08-17 ENCOUNTER — Telehealth: Payer: Self-pay | Admitting: *Deleted

## 2022-08-17 NOTE — Telephone Encounter (Signed)
Dr. Jeanett Schlein please call patient with her xray results.  States she is still having problems.  Asking to be called after 12 noon and let the phone ring 7 times so she will know to answer.

## 2022-09-02 ENCOUNTER — Ambulatory Visit: Payer: Medicare Other

## 2022-09-02 ENCOUNTER — Encounter: Payer: Medicare Other | Admitting: Student

## 2022-09-09 ENCOUNTER — Ambulatory Visit (INDEPENDENT_AMBULATORY_CARE_PROVIDER_SITE_OTHER): Payer: Medicare Other

## 2022-09-09 ENCOUNTER — Encounter: Payer: Self-pay | Admitting: Student

## 2022-09-09 ENCOUNTER — Other Ambulatory Visit: Payer: Self-pay

## 2022-09-09 ENCOUNTER — Ambulatory Visit (INDEPENDENT_AMBULATORY_CARE_PROVIDER_SITE_OTHER): Payer: Medicare Other | Admitting: Student

## 2022-09-09 VITALS — BP 115/67 | HR 87 | Temp 98.2°F | Ht 66.0 in | Wt 191.3 lb

## 2022-09-09 VITALS — BP 115/67 | HR 87 | Temp 98.2°F | Resp 28 | Ht 66.0 in | Wt 191.3 lb

## 2022-09-09 DIAGNOSIS — I10 Essential (primary) hypertension: Secondary | ICD-10-CM | POA: Diagnosis not present

## 2022-09-09 DIAGNOSIS — H9193 Unspecified hearing loss, bilateral: Secondary | ICD-10-CM | POA: Diagnosis not present

## 2022-09-09 DIAGNOSIS — E039 Hypothyroidism, unspecified: Secondary | ICD-10-CM

## 2022-09-09 DIAGNOSIS — M797 Fibromyalgia: Secondary | ICD-10-CM | POA: Diagnosis not present

## 2022-09-09 DIAGNOSIS — Z0000 Encounter for general adult medical examination without abnormal findings: Secondary | ICD-10-CM | POA: Diagnosis not present

## 2022-09-09 DIAGNOSIS — H9190 Unspecified hearing loss, unspecified ear: Secondary | ICD-10-CM | POA: Insufficient documentation

## 2022-09-09 MED ORDER — TRAMADOL HCL 50 MG PO TABS: 25.0000 mg | ORAL_TABLET | Freq: Two times a day (BID) | ORAL | 1 refills | Status: DC

## 2022-09-09 NOTE — Patient Instructions (Signed)
Ms. Wessells,  It was a pleasure seeing you in the clinic today.   For your fibromyalgia, I have prescribed tramadol at your previous dose (25mg  twice daily). I have removed cymbalta from your medication list. For your hearing impairment, I have placed a referral to the Ear, Nose, Throat doctor for further evaluation. Please come back to see in 3 months (or sooner if needed).  Please call our clinic at [phone] if you have any questions or concerns. The best time to call is Monday-Friday from 9am-4pm, but there is someone available 24/7 at the same number. If you need medication refills, please notify your pharmacy one week in advance and they will send [phone] a request.   Thank you for letting us take part in your care. We look forward to seeing you next time!

## 2022-09-09 NOTE — Assessment & Plan Note (Signed)
TSH normal during recent check. Continue synthroid daily.

## 2022-09-09 NOTE — Assessment & Plan Note (Signed)
Today's Vitals   09/09/22 1326 09/09/22 1329  BP: 115/67   Pulse: 87   Resp: (!) 28   Temp: 98.2 F (36.8 C)   TempSrc: Oral   SpO2: 98%   Weight: 191 lb 4.8 oz (86.8 kg)   Height: 5\' 6"  (1.676 m)   PainSc:  3    Body mass index is 30.88 kg/m.  Patient with diagnosis of HTN but has never been on antihypertensive therapy. BP has been normal during the last several visits and again within normal range today. Will resolve this issue.

## 2022-09-09 NOTE — Progress Notes (Signed)
   CC: f/u fibromyalgia  HPI:  [name] is a 76 y.o. Female with history listed below presenting to the [facility] for f/u fibromyalgia. Please see individualized problem based charting for full HPI.  Past Medical History:  Diagnosis Date   Allergy    Arthritis    Asthma    Chest pain of uncertain etiology 03/25/2021   Fibromyalgia    Fibromyalgia muscle pain    Neuromuscular disorder ([facility])    Osteoporosis    Thyroid disease     Review of Systems:  Negative aside from that listed in individualized problem based charting.  Physical Exam:  Vitals:   09/09/22 1326  BP: 115/67  Pulse: 87  Resp: (!) 28  Temp: 98.2 F (36.8 C)  TempSrc: Oral  SpO2: 98%  Weight: 191 lb 4.8 oz (86.8 kg)  Height: 5\' 6"  (1.676 m)   Physical Exam Constitutional:      Appearance: She is obese. She is not ill-appearing.  HENT:     Right Ear: External ear normal.     Left Ear: External ear normal.     Ears:     Comments: Narrow ear canals bilaterally, unable to appropriately insert otoscope for full visualization. Cerumen present bilaterally. No pain with manipulation of outer ear bilaterally.    Mouth/Throat:     Mouth: Mucous membranes are moist.     Pharynx: Oropharynx is clear.  Eyes:     Extraocular Movements: Extraocular movements intact.     Conjunctiva/sclera: Conjunctivae normal.  Cardiovascular:     Rate and Rhythm: Normal rate and regular rhythm.     Pulses: Normal pulses.     Heart sounds: Normal heart sounds. No murmur heard.    No friction rub. No gallop.  Pulmonary:     Effort: Pulmonary effort is normal.     Breath sounds: Normal breath sounds. No wheezing, rhonchi or rales.  Abdominal:     General: Bowel sounds are normal. There is no distension.     Palpations: Abdomen is soft.     Tenderness: There is no abdominal tenderness.  Musculoskeletal:        General: No swelling. Normal range of motion.  Skin:    General: Skin is warm and dry.  Neurological:     General: No  focal deficit present.     Mental Status: She is alert and oriented to person, place, and time.  Psychiatric:        Mood and Affect: Mood normal.        Behavior: Behavior normal.      Assessment & Plan:   See Encounters Tab for problem based charting.  Patient discussed with Dr.  

## 2022-09-09 NOTE — Assessment & Plan Note (Signed)
Patient with long-standing fibromyalgia, diagnosed about 20 years ago. She was previously on chronic tramadol therapy which worked well for and allowed for her to remain functional. She was taking 25mg  BID with good benefit. She was recently switched from tramadol to cymbalta for better control. She reports that after she used cymbalta, she began having worsening fatigue and overall feeling bad. She stopped cymbalta after one use and has since had continued fatigue and generalized body aches and joint pains. She would like to restart tramadol therapy as she was finding much more benefit with this, especially in terms of functionality. She is not on any other CNS acting medications and was previously on a [place] dose of tramadol so will restart her previous dosing to allow for her to be more functional again.   Plan: -stop cymbalta -restart tramadol 25mg  BID

## 2022-09-09 NOTE — Progress Notes (Signed)
Subjective:   Jacqueline Wyatt is a 76 y.o. female who presents for Medicare Annual (Subsequent) preventive examination. I connected with  Marua Kreger on 09/09/22 by a  Face-To-Face  enabled telemedicine application and verified that I am speaking with the correct person using two identifiers.  Patient Location: Other:  Office/Clinic  Provider Location: Office/Clinic  I discussed the limitations of evaluation and management by telemedicine. The patient expressed understanding and agreed to proceed.  Review of Systems    Defer to PCP       Objective:    Today's Vitals   09/09/22 1506  PainSc: 3    There is no height or weight on file to calculate BMI.     09/09/2022    1:25 PM 08/12/2022    2:30 PM 07/29/2022    4:00 PM 03/25/2021    2:35 PM 03/23/2021    8:37 PM 07/03/2020    2:11 PM 03/08/2019    1:12 PM  Advanced Directives  Does Patient Have a Medical Advance Directive? No No No  No No No  Does patient want to make changes to medical advance directive?     No - Patient declined    Would patient like information on creating a medical advance directive? No - Patient declined No - Patient declined No - Patient declined No - Patient declined  No - Patient declined Yes (ED - Information included in AVS)    Current Medications (verified) Outpatient Encounter Medications as of 09/09/2022  Medication Sig   aspirin EC 81 MG EC tablet Take 1 tablet (81 mg total) by mouth daily. Swallow whole.   B Complex Vitamins (VITAMIN B COMPLEX PO) Take 1 tablet by mouth daily.   Coenzyme Q10 50 MG CHEW Chew 100 mg by mouth daily.   diclofenac Sodium (VOLTAREN) 1 % GEL Apply 2 g topically 4 (four) times daily.   levothyroxine (SYNTHROID) 50 MCG tablet TAKE 1 TABLET BY MOUTH EVERY DAY BEFORE BREAKFAST   loratadine (CLARITIN) 10 MG tablet Take 10 mg by mouth daily.   triamcinolone cream (KENALOG) 0.1 % Apply 1 application. topically 2 (two) times daily.   Vitamin D, Ergocalciferol, (DRISDOL) 1.25 MG  (50000 UNIT) CAPS capsule Take 1 capsule (50,000 Units total) by mouth every 7 (seven) days.   No facility-administered encounter medications on file as of 09/09/2022.    Allergies (verified) Almotriptan malate, Banana, Eggs or egg-derived products, Imitrex [sumatriptan base], Penicillins, Relpax [eletriptan hydrobromide], Sulfa drugs cross reactors, Vantin, Watermelon flavor, and Azithromycin   History: Past Medical History:  Diagnosis Date   Allergy    Arthritis    Asthma    Chest pain of uncertain etiology 03/28/8755   Fibromyalgia    Fibromyalgia muscle pain    Neuromuscular disorder (HCC)    Osteoporosis    Thyroid disease    Past Surgical History:  Procedure Laterality Date   LEFT HEART CATH AND CORONARY ANGIOGRAPHY N/A 03/24/2021   Procedure: LEFT HEART CATH AND CORONARY ANGIOGRAPHY;  Surgeon: Jettie Booze, MD;  Location: [facility];  Service: Cardiovascular;  Laterality: N/A;   Family History  Problem Relation Age of Onset   Heart disease Mother    Hypertension Mother    Hyperlipidemia Mother    Hearing loss Mother    Kidney disease Mother    Vision loss Mother    Lung disease Mother    Stroke Father    Arthritis Sister    Meniere's disease Brother    Cancer Maternal  Grandmother    Stroke Maternal Grandmother    Heart disease Maternal Grandfather    Heart disease Maternal Uncle    Social History   Socioeconomic History   Marital status: Single    Spouse name: Not on file   Number of children: Not on file   Years of education: Not on file   Highest education level: Not on file  Occupational History   Not on file  Tobacco Use   Smoking status: Never   Smokeless tobacco: Never  Substance and Sexual Activity   Alcohol use: Not on file   Drug use: Not on file   Sexual activity: Not on file  Other Topics Concern   Not on file  Social History Narrative   Not on file   Social Determinants of Health   Financial Resource Strain: Hyndman Risk   (09/09/2022)   Overall Financial Resource Strain (CARDIA)    Difficulty of Paying Living Expenses: Not hard at all  Food Insecurity: No Food Insecurity (09/09/2022)   Hunger Vital Sign    Worried About Running Out of Food in the Last Year: Never true    Register in the Last Year: Never true  Transportation Needs: No Transportation Needs (09/09/2022)   PRAPARE - Hydrologist (Medical): No    Lack of Transportation (Non-Medical): No  Physical Activity: Inactive (09/09/2022)   Exercise Vital Sign    Days of Exercise per Week: 0 days    Minutes of Exercise per Session: 0 min  Stress: Stress Concern Present (09/09/2022)   Gas    Feeling of Stress : To some extent  Social Connections: Socially Isolated (09/09/2022)   Social Connection and Isolation Panel [NHANES]    Frequency of Communication with Friends and Family: Once a week    Frequency of Social Gatherings with Friends and Family: Once a week    Attends Religious Services: Never    Marine scientist or Organizations: Yes    Attends Music therapist: More than 4 times per year    Marital Status: Never married    Tobacco Counseling Counseling given: Not Answered   Clinical Intake:  Pre-visit preparation completed: Yes  Pain : 0-10 Pain Score: 3  Pain Type: Chronic pain Pain Location: Foot Pain Orientation: Right Pain Onset: 1 to 4 weeks ago Pain Frequency: Intermittent        How often do you need to have someone help you when you read instructions, pamphlets, or other written materials from your doctor or pharmacy?: 2 - Rarely What is the last grade level you completed in school?: 12th grade  Diabetic?No  Interpreter Needed?: No  Information entered by :: Corey Skains Katalaya Beel,cma 09/09/2022 3:07pm   Activities of Daily Living    09/09/2022    1:24 PM 08/12/2022    2:31 PM  In your present  state of health, do you have any difficulty performing the following activities:  Hearing? 1 1  Comment hearing unable to wear   Vision? 1 0  Comment blurry   Difficulty concentrating or making decisions? 0 0  Walking or climbing stairs? 0 0  Dressing or bathing? 0 0  Doing errands, shopping? 0 0    Patient Care Team: Teola Bradley, MD as PCP - General (Family Medicine) Stanford Breed Denice Bors, MD as PCP - Cardiology (Cardiology)  Indicate any recent Medical Services you may have received from other than [facility]  providers in the past year (date may be approximate).     Assessment:   This is a routine wellness examination for Tanish.  Hearing/Vision screen No results found.  Dietary issues and exercise activities discussed:     Goals Addressed   None   Depression Screen    07/29/2022    5:36 PM 11/17/2020    1:37 PM 07/03/2020    2:07 PM 05/01/2020    1:31 PM 11/07/2019    1:59 PM 05/08/2019   11:20 AM 11/24/2018    3:44 PM  PHQ 2/9 Scores  PHQ - 2 Score 2 0 0 0 0 0 0  PHQ- 9 Score 10          Fall Risk    09/09/2022    1:24 PM 08/12/2022    2:31 PM 07/29/2022    3:59 PM 11/17/2020    1:37 PM 07/03/2020    2:07 PM  [place] in the past year? 0 0 0 0 0  Number falls in past yr: 0 0 0 0 1  Injury with Fall? 0 0 0 0 0  Risk for fall due to : No Fall Risks  No Fall Risks    Follow up Falls evaluation completed;Falls prevention discussed Falls evaluation completed Falls evaluation completed;Falls prevention discussed Falls evaluation completed Falls evaluation completed;Education provided    FALL RISK PREVENTION PERTAINING TO THE HOME:  Any stairs in or around the home? Yes  If so, are there any without handrails? No  Home free of loose throw rugs in walkways, pet beds, electrical cords, etc? Yes  Adequate lighting in your home to reduce risk of falls? Yes   ASSISTIVE DEVICES UTILIZED TO PREVENT FALLS:  Life alert? No  Use of a cane, walker or w/c? Yes  Grab  bars in the bathroom? Yes  Shower chair or bench in shower? No  Elevated toilet seat or a handicapped toilet? No   TIMED UP AND GO:  Was the test performed? No .  Length of time to ambulate 10 feet: N/A sec.   Gait slow and steady with assistive device  Cognitive Function:        07/03/2020    2:05 PM 03/08/2019    1:19 PM  6CIT Screen  What Year? 0 points 0 points  What month? 0 points 0 points  What time? 0 points 0 points  Count back from 20 0 points 0 points  Months in reverse 0 points 0 points  Repeat phrase  0 points  Total Score  0 points    Immunizations Immunization History  Administered Date(s) Administered   MMR 11/23/2009   PFIZER Comirnaty(Gray Top)Covid-19 Tri-Sucrose Vaccine 11/25/2020   PFIZER(Purple Top)SARS-COV-2 Vaccination 03/02/2020, 04/02/2020   Pfizer Covid-19 Vaccine Bivalent Booster 45yr & up 11/05/2021   Pneumococcal Conjugate-13 09/09/2014   Pneumococcal Polysaccharide-23 05/01/2020   Td 11/23/2002   Tdap 07/31/2012    TDAP status: Due, Education has been provided regarding the importance of this vaccine. Advised may receive this vaccine at local pharmacy or Health Dept. Aware to provide a copy of the vaccination record if obtained from local pharmacy or Health Dept. Verbalized acceptance and understanding.  Flu Vaccine status: Due, Education has been provided regarding the importance of this vaccine. Advised may receive this vaccine at local pharmacy or Health Dept. Aware to provide a copy of the vaccination record if obtained from local pharmacy or Health Dept. Verbalized acceptance and understanding.  Pneumococcal vaccine status: Up to  date  Covid-19 vaccine status: Completed vaccines  Qualifies for Shingles Vaccine? No   Zostavax completed No   Shingrix Completed?: No.    Education has been provided regarding the importance of this vaccine. Patient has been advised to call insurance company to determine out of pocket expense if they have  not yet received this vaccine. Advised may also receive vaccine at local pharmacy or Health Dept. Verbalized acceptance and understanding.  Screening Tests Health Maintenance  Topic Date Due   Zoster Vaccines- Shingrix (1 of 2) Never done   TETANUS/TDAP  07/31/2022   INFLUENZA VACCINE  01/22/2026 (Originally 05/25/2022)   Medicare Annual Wellness (AWV)  09/10/2023   Pneumonia Vaccine 17+ Years old  Completed   DEXA SCAN  Completed   Hepatitis C Screening  Completed   HPV VACCINES  Aged Out   COVID-19 Vaccine  Discontinued    Health Maintenance  Health Maintenance Due  Topic Date Due   Zoster Vaccines- Shingrix (1 of 2) Never done   TETANUS/TDAP  07/31/2022     Lung Cancer Screening: ([place] Dose CT Chest recommended if Age 25-80 years, 30 pack-year currently smoking OR have quit w/in 15years.) does not qualify.   Lung Cancer Screening Referral: N/A  Additional Screening:  Hepatitis C Screening: does not qualify; Completed 10/25/2002  Vision Screening: Recommended annual ophthalmology exams for early detection of glaucoma and other disorders of the eye. Is the patient up to date with their annual eye exam?  No  Who is the provider or what is the name of the office in which the patient attends annual eye exams? N/A If pt is not established with a provider, would they like to be referred to a provider to establish care? No .   Dental Screening: Recommended annual dental exams for proper oral hygiene  Community Resource Referral / Chronic Care Management: CRR required this visit?  No   CCM required this visit?  No      Plan:     I have personally reviewed and noted the following in the patient's chart:   Medical and social history Use of alcohol, tobacco or illicit drugs  Current medications and supplements including opioid prescriptions. Patient is not currently taking opioid prescriptions. Functional ability and status Nutritional status Physical activity Advanced  directives List of other physicians Hospitalizations, surgeries, and ER visits in previous 12 months Vitals Screenings to include cognitive, depression, and falls Referrals and appointments  In addition, I have reviewed and discussed with patient certain preventive protocols, quality metrics, and best practice recommendations. A written personalized care plan for preventive services as well as general preventive health recommendations were provided to patient.     Kerin Perna, [facility]   09/09/2022   Nurse Notes: Face-To-Face visit  Ms. Domzalski , Thank you for taking time to come for your Medicare Wellness Visit. I appreciate your ongoing commitment to your health goals. Please review the following plan we discussed and let me know if I can assist you in the future.   These are the goals we discussed:  Goals   None     This is a list of the screening recommended for you and due dates:  Health Maintenance  Topic Date Due   Zoster (Shingles) Vaccine (1 of 2) Never done   Tetanus Vaccine  07/31/2022   Flu Shot  01/22/2026*   Medicare Annual Wellness Visit  09/10/2023   Pneumonia Vaccine  Completed   DEXA scan (bone density measurement)  Completed  Hepatitis C Screening: USPSTF Recommendation to screen - Ages 63-79 yo.  Completed   HPV Vaccine  Aged Out   COVID-19 Vaccine  Discontinued  *Topic was postponed. The date shown is not the original due date.

## 2022-09-09 NOTE — Assessment & Plan Note (Signed)
Patient complaining of progressively decreased hearing and having difficulty with distinguishing speech in larger crowds. She reports being evaluated with a hearing test, which revealed that she was missing consonants intermittently. She states that she has tried hearing aids, but has difficulty keeping them in place due to very narrow ear canals bilaterally. She has even tried child-sized hearing aids but these become displaced very frequently and it is troublesome constantly putting them back in ears. She also has difficulty with removing cerumen from her ears despite use of debrox ear drops and using a syringe to flush cerumen out.   On exam, she does have narrow ear canals bilaterally. It is difficult to appropriately place otoscope in ears to fully visualize ear canal. There is cerumen present bilaterally. Unable to visualize tympanic membranes. No pain with manipulation of external ears.  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 Will refer her to ENT for better visualization and further evaluation to decide what would be an effective way to improve her hearing.   Plan: -ENT referral

## 2022-09-21 NOTE — Progress Notes (Signed)
Internal Medicine Clinic Attending  Case discussed with Dr. Austin Miles  At the time of the visit.  We reviewed the resident's history and exam and pertinent patient test results.  I agree with the assessment, diagnosis, and plan of care documented in the resident's note, including resumption of tramadol which has been effective for her in the past without adverse effects, allowing her to participate in her otherwise active life.

## 2022-09-23 NOTE — Progress Notes (Signed)
Internal Medicine Clinic Attending  Case and documentation of Dr. Austin Miles  soon after the resident saw the patient reviewed.  I reviewed the AWV findings.  I agree with the assessment, diagnosis, and plan of care documented in the AWV note.

## 2022-12-09 ENCOUNTER — Other Ambulatory Visit: Payer: Self-pay

## 2022-12-09 ENCOUNTER — Ambulatory Visit (INDEPENDENT_AMBULATORY_CARE_PROVIDER_SITE_OTHER): Payer: Medicare Other | Admitting: Internal Medicine

## 2022-12-09 ENCOUNTER — Encounter: Payer: Self-pay | Admitting: Internal Medicine

## 2022-12-09 VITALS — BP 128/78 | HR 85 | Temp 98.0°F | Ht 65.0 in | Wt 189.8 lb

## 2022-12-09 DIAGNOSIS — I10 Essential (primary) hypertension: Secondary | ICD-10-CM

## 2022-12-09 DIAGNOSIS — Z23 Encounter for immunization: Secondary | ICD-10-CM

## 2022-12-09 DIAGNOSIS — M797 Fibromyalgia: Secondary | ICD-10-CM

## 2022-12-09 DIAGNOSIS — H9193 Unspecified hearing loss, bilateral: Secondary | ICD-10-CM | POA: Diagnosis not present

## 2022-12-09 DIAGNOSIS — E559 Vitamin D deficiency, unspecified: Secondary | ICD-10-CM | POA: Diagnosis not present

## 2022-12-09 NOTE — Progress Notes (Signed)
CC: follow up appointment  HPI:  Jacqueline Wyatt is a 77 y.o. with medical history of fibromyalgia, HTN, hypothyroidism, hearing impairment presenting to [facility] for follow up.  Please see problem-based list for further details, assessments, and plans.  Past Medical History:  Diagnosis Date   Allergy    Arthritis    Asthma    Chest pain of uncertain etiology [id number]   Chronic pain syndrome 08/05/2017   Essential hypertension 05/27/2021   Fibromyalgia    Fibromyalgia muscle pain    Neuromuscular disorder ([place])    Osteoporosis    Right foot pain 07/29/2022   Patient has localized pain to medial aspect of right heel.  There is no visible swelling, redness or deformity.  After the physical examination I do not anticipate a fracture or acute bone pathology.  I will prescribe topical Voltaren gel for symptomatic management.  We will reevaluate the patient on the follow-up visit.   Thyroid disease     Current Outpatient Medications (Endocrine & Metabolic):    levothyroxine (SYNTHROID) 50 MCG tablet, TAKE 1 TABLET BY MOUTH EVERY DAY BEFORE BREAKFAST   Current Outpatient Medications (Respiratory):    loratadine (CLARITIN) 10 MG tablet, Take 10 mg by mouth daily.  Current Outpatient Medications (Analgesics):    traMADol (ULTRAM) 50 MG tablet, Take 0.5 tablets (25 mg total) by mouth 2 (two) times daily.   Current Outpatient Medications (Other):    B Complex Vitamins (VITAMIN B COMPLEX PO), Take 1 tablet by mouth daily.   Coenzyme Q10 50 MG CHEW, Chew 100 mg by mouth daily.   diclofenac Sodium (VOLTAREN) 1 % GEL, Apply 2 g topically 4 (four) times daily.   triamcinolone cream (KENALOG) 0.1 %, Apply 1 application. topically 2 (two) times daily.  Review of Systems:  Review of system negative unless stated in the problem list or HPI.    Physical Exam:  Vitals:   12/09/22 1439  BP: 128/78  Pulse: 85  Temp: 98 F (36.7 C)  TempSrc: Oral  SpO2: 97%  Weight: 189 lb 12.8 oz (86.1  kg)  Height: 5' 5"$  (1.651 m)    Physical Exam General: NAD HENT: NCAT Lungs: CTAB, no wheeze, rhonchi or rales.  Cardiovascular: Normal heart sounds, no r/m/g, 2+ pulses in all extremities. No LE edema Abdomen: No TTP, normal bowel sounds MSK: No asymmetry or muscle atrophy, kyphosis noted.   Skin: no lesions noted on exposed skin Neuro: Alert and oriented x4. CN grossly intact Psych: Normal mood and normal affect   Assessment & Plan:   Vitamin D deficiency Pt has vitamin D deficiency and takes 50000 units of vitamin D every 2 to 3 weeks. She was given this to take weekly but has been taken it every 2-3 weeks. She has one tablet left. Will advise her to finish this and start taking 2000 units daily for maintenance as her repeat level was normal at 34.    Fibromyalgia Pt states her pain is more controlled on tramadol. She takes 1/2 a tablet as needed for pain. Will refer for provider referred exercise program.   Hearing impairment Pt has an appointment with audiology and ENT coming up. Her ear exam was limited by narrow ear canals that could not be fully visualized.   Osteoporosis Last dexa scan showed osteopenia. Will get repeat dexa scan as last one was about 2 years ago and pt previously had osteoporosis. Will continue repleting vitamin D.    See Encounters Tab for problem based charting.  Patient discussed with Dr. Denzil Magnuson, MD Tillie Rung. [facility] Internal Medicine Residency, PGY-2

## 2022-12-09 NOTE — Patient Instructions (Signed)
Jacqueline Wyatt, it was a pleasure seeing you today! You endorsed feeling well today. Below are some of the things we talked about this visit. We look forward to seeing you in the follow up appointment!  Today we discussed: We will get lab work today. Please continue taking your medications. I will check your vitamin D level. For your fibromyalgia, increasing your exercise will help so I am referring you to exercise program.   I have ordered the following labs today:  Lab Orders         Vitamin D (25 hydroxy)       Referrals ordered today:   Referral Orders         Amb Referral To Provider Referral Exercise Program (P.R.E.P)      I have ordered the following medication/changed the following medications:   Stop the following medications: Medications Discontinued During This Encounter  Medication Reason   aspirin EC 81 MG EC tablet Patient has not taken in last 30 days     Start the following medications: No orders of the defined types were placed in this encounter.    Follow-up: 3 month follow up  Please make sure to arrive 15 minutes prior to your next appointment. If you arrive late, you may be asked to reschedule.   We look forward to seeing you next time. Please call our clinic at [phone] if you have any questions or concerns. The best time to call is Monday-Friday from 9am-4pm, but there is someone available 24/7. If after hours or the weekend, call the main hospital number and ask for the Internal Medicine Resident On-Call. If you need medication refills, please notify your pharmacy one week in advance and they will send [place] a request.  Thank you for letting us take part in your care. Wishing you the best!  Thank you, Idamae Schuller, MD

## 2022-12-10 LAB — BMP8+ANION GAP
Anion Gap: 16 mmol/L (ref 10.0–18.0)
BUN/Creatinine Ratio: 19 (ref 12–28)
BUN: 18 mg/dL (ref 8–27)
CO2: 21 mmol/L (ref 20–29)
Calcium: 9.4 mg/dL (ref 8.7–10.3)
Chloride: 102 mmol/L (ref 96–106)
Creatinine, Ser: 0.96 mg/dL (ref 0.57–1.00)
Glucose: 115 mg/dL — ABNORMAL HIGH (ref 70–99)
Potassium: 4.8 mmol/L (ref 3.5–5.2)
Sodium: 139 mmol/L (ref 134–144)
eGFR: 61 mL/min/{1.73_m2} (ref >59)

## 2022-12-10 LAB — VITAMIN D 25 HYDROXY (VIT D DEFICIENCY, FRACTURES): Vit D, 25-Hydroxy: 34.3 ng/mL (ref 30.0–100.0)

## 2022-12-11 NOTE — Assessment & Plan Note (Signed)
Last dexa scan showed osteopenia. Will get repeat dexa scan as last one was about 2 years ago and pt previously had osteoporosis. Will continue repleting vitamin D.

## 2022-12-11 NOTE — Assessment & Plan Note (Signed)
Pt has vitamin D deficiency and takes 50000 units of vitamin D every 2 to 3 weeks. She was given this to take weekly but has been taken it every 2-3 weeks. She has one tablet left. Will advise her to finish this and start taking 2000 units daily for maintenance as her repeat level was normal at 34.

## 2022-12-11 NOTE — Assessment & Plan Note (Signed)
Pt has an appointment with audiology and ENT coming up. Her ear exam was limited by narrow ear canals that could not be fully visualized.

## 2022-12-11 NOTE — Assessment & Plan Note (Signed)
Pt states her pain is more controlled on tramadol. She takes 1/2 a tablet as needed for pain. Will refer for provider referred exercise program.

## 2022-12-14 NOTE — Progress Notes (Signed)
Internal Medicine Clinic Attending  Case discussed with Dr. Khan  At the time of the visit.  We reviewed the resident's history and exam and pertinent patient test results.  I agree with the assessment, diagnosis, and plan of care documented in the resident's note.  

## 2023-01-05 ENCOUNTER — Other Ambulatory Visit: Payer: Self-pay

## 2023-01-05 DIAGNOSIS — E039 Hypothyroidism, unspecified: Secondary | ICD-10-CM

## 2023-01-05 DIAGNOSIS — M797 Fibromyalgia: Secondary | ICD-10-CM

## 2023-01-06 MED ORDER — TRAMADOL HCL 50 MG PO TABS: 25.0000 mg | ORAL_TABLET | Freq: Two times a day (BID) | ORAL | 1 refills | Status: DC

## 2023-01-06 MED ORDER — LEVOTHYROXINE SODIUM 50 MCG PO TABS: ORAL_TABLET | ORAL | 3 refills | Status: DC

## 2023-02-16 ENCOUNTER — Telehealth: Payer: Self-pay | Admitting: *Deleted

## 2023-02-16 NOTE — Telephone Encounter (Signed)
Contacted regarding PREP Class referral. She is not interested in participating at this time. Instructed her to call back to further discuss at a later date if she changes her mind.

## 2023-03-09 ENCOUNTER — Encounter: Payer: Self-pay | Admitting: Student

## 2023-03-09 ENCOUNTER — Ambulatory Visit (HOSPITAL_COMMUNITY)
Admission: RE | Admit: 2023-03-09 | Discharge: 2023-03-09 | Disposition: A | Discharge status: Home / Self Care | Payer: Medicare Other | Source: Ambulatory Visit | Attending: Internal Medicine | Admitting: Internal Medicine

## 2023-03-09 ENCOUNTER — Ambulatory Visit (INDEPENDENT_AMBULATORY_CARE_PROVIDER_SITE_OTHER): Payer: Medicare Other | Admitting: Student

## 2023-03-09 ENCOUNTER — Other Ambulatory Visit: Payer: Self-pay | Admitting: Student

## 2023-03-09 VITALS — BP 123/57 | HR 86 | Temp 98.6°F | Wt 179.4 lb

## 2023-03-09 DIAGNOSIS — M810 Age-related osteoporosis without current pathological fracture: Secondary | ICD-10-CM

## 2023-03-09 DIAGNOSIS — R5383 Other fatigue: Secondary | ICD-10-CM | POA: Diagnosis not present

## 2023-03-09 DIAGNOSIS — G8929 Other chronic pain: Secondary | ICD-10-CM

## 2023-03-09 DIAGNOSIS — E039 Hypothyroidism, unspecified: Secondary | ICD-10-CM

## 2023-03-09 DIAGNOSIS — M546 Pain in thoracic spine: Secondary | ICD-10-CM | POA: Insufficient documentation

## 2023-03-09 DIAGNOSIS — M797 Fibromyalgia: Secondary | ICD-10-CM

## 2023-03-09 DIAGNOSIS — R5382 Chronic fatigue, unspecified: Secondary | ICD-10-CM | POA: Diagnosis not present

## 2023-03-09 NOTE — Assessment & Plan Note (Signed)
Continue tramadol 25 mg twice daily.

## 2023-03-09 NOTE — Assessment & Plan Note (Signed)
Patient is here for evaluation of her chronic fatigue, that has been going on for many years.  She reports Giuliani level of energy and muscle tightness when waking up in the morning.  She still has pleasure of doing things but does not have the energy for it.  She states that it takes her a lot longer to perform her usual house chores.  She denies any joint pain but does have upper back pain with standing for a long time.  Patient has lost about 10 pounds in the last 3 months by eating less junk food.  She denies constipation, hematochezia or melena.  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  She felt frustrated due to her lack of physical activity.  Patient is taking tramadol for fibromyalgia with good relief and it allows her to function.  She however only takes her levothyroxine 1-2 doses a week.  She does not like to take pills and thinks that this medication does not help with her fatigue.  She was referred to an exercise program but did not proceed because she felt better after exercise in the past.  Her physical exam is unremarkable.  She has normal strength and range of motion of all extremities.  Her chronic fatigue is multifactorial.  I empathized with her frustration and explained to her that there is no single medication that can fix all of her symptoms.  - Her PHQ-9 is 7 indicates mild depression, which can be contributing but no indication for medication.   - Her levothyroxine nonadherence can definitely contribute to a [name] energy level.  I strongly encouraged patient to consistently take her medication in the morning on an empty stomach every day.  Will recheck TSH level at follow-up when she consistently take her levothyroxine. - Will obtain vitamin D and CBC to look for any underlying causes such as anemia. - Encourage patient to follow-up with the exercise program to increase her physical activity - Continue tramadol for pain control due to fibromyalgia - Follow-up in 3 months

## 2023-03-09 NOTE — Patient Instructions (Signed)
Ms. Jacqueline Wyatt,  It was nice seeing you in the clinic today.  I am sorry you are not feeling well.    I will check blood work for review of vitamin D and blood count to hopefully rule out any causes of your fatigue.  Please reach out to [facility] for your exercise program.  It is important that you take your levothyroxine daily, first thing in the morning on an empty stomach.  I also order an x-ray of your upper back to look for any compression fracture.  If you do, you will need to be on a bisphosphonate therapy for osteoporosis.  Please also reach out to schedule DEXA scan.  Please follow-up in 3 months sooner if needed  Dr. Cyndie Chime

## 2023-03-09 NOTE — Progress Notes (Signed)
Internal Medicine Clinic Attending  Case discussed with Dr. Nguyen  At the time of the visit.  We reviewed the resident's history and exam and pertinent patient test results.  I agree with the assessment, diagnosis, and plan of care documented in the resident's note. 

## 2023-03-09 NOTE — Progress Notes (Signed)
CC: chronic fatigue  HPI:  [name] is a 76 y.o. living with hypothyroidism, IBS, osteopenia, who presents to the clinic today for evaluation of her chronic fatigue.  Please see problem based charting for detail  Past Medical History:  Diagnosis Date   Allergy    Arthritis    Asthma    Chest pain of uncertain etiology 03/25/2021   Chronic pain syndrome 08/05/2017   Essential hypertension 05/27/2021   Fibromyalgia    Fibromyalgia muscle pain    Neuromuscular disorder (HCC)    Osteoporosis    Right foot pain 07/29/2022   Patient has localized pain to medial aspect of right heel.  There is no visible swelling, redness or deformity.  After the physical examination I do not anticipate a fracture or acute bone pathology.  I will prescribe topical Voltaren gel for symptomatic management.  We will reevaluate the patient on the follow-up visit.   Thyroid disease    Review of Systems:  per HPI  Physical Exam:  Vitals:   03/09/23 1421 03/09/23 1422  BP:  (!) 123/57  Pulse:  86  Temp:  98.6 F (37 C)  TempSrc:  Oral  SpO2:  99%  Weight: 179 lb 6.4 oz (81.4 kg) 179 lb 6.4 oz (81.4 kg)   Physical Exam Constitutional:      General: She is not in acute distress.    Appearance: She is not ill-appearing.  HENT:     Head: Normocephalic.  Eyes:     General:        Right eye: No discharge.        Left eye: No discharge.     Conjunctiva/sclera: Conjunctivae normal.  Cardiovascular:     Rate and Rhythm: Normal rate and regular rhythm.  Pulmonary:     Effort: Pulmonary effort is normal. No respiratory distress.     Breath sounds: Normal breath sounds. No wheezing.  Musculoskeletal:     Cervical back: Normal range of motion.     Comments: Point tenderness at T10 midline  Neurological:     Mental Status: She is alert.     Comments: 5/5 strength of all extremities.  Normal range of motion.  Normal gait.  Psychiatric:        Mood and Affect: Mood normal.      Assessment  & Plan:   See Encounters Tab for problem based charting.  Chronic fatigue Patient is here for evaluation of her chronic fatigue, that has been going on for many years.  She reports Jacqueline Wyatt level of energy and muscle tightness when waking up in the morning.  She still has pleasure of doing things but does not have the energy for it.  She states that it takes her a lot longer to perform her usual house chores.  She denies any joint pain but does have upper back pain with standing for a long time.  Patient has lost about 10 pounds in the last 3 months by eating less junk food.  She denies constipation, hematochezia or melena.  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  She felt frustrated due to her lack of physical activity.  Patient is taking tramadol for fibromyalgia with good relief and it allows her to function.  She however only takes her levothyroxine 1-2 doses a week.  She does not like to take pills and thinks that this medication does not help with her fatigue.  She was referred to an exercise program but did not  proceed because she felt better after exercise in the past.  Her physical exam is unremarkable.  She has normal strength and range of motion of all extremities.  Her chronic fatigue is multifactorial.  I empathized with her frustration and explained to her that there is no single medication that can fix all of her symptoms.  - Her PHQ-9 is 7 indicates mild depression, which can be contributing but no indication for medication.   - Her levothyroxine nonadherence can definitely contribute to a Jacqueline Wyatt energy level.  I strongly encouraged patient to consistently take her medication in the morning on an empty stomach every day.  Will recheck TSH level at follow-up when she consistently take her levothyroxine. - Will obtain vitamin D and CBC to look for any underlying causes such as anemia. - Encourage patient to follow-up with the exercise program to increase her physical activity -  Continue tramadol for pain control due to fibromyalgia - Follow-up in 3 months  Fibromyalgia Continue tramadol 25 mg twice daily.  Hypothyroidism Discussed as above.  Continue levothyroxine 50 mcg daily. Repeat TSH at next visit  Osteoporosis Her 2021 DEXA scan did show osteopenia.  Patient is due for repeat DEXA scan.  She has history of osteoporosis but not on a bisphosphonate therapy.  She has a point tenderness around T10-T11 spinous process that suspicious for compression fracture.  Will obtain x-ray of the thoracic spine.  If confirmed compression fracture, she will need to be on a bisphosphonate therapy.  Patient will contact the imaging center for repeat DEXA scan.   Patient discussed with Dr.  Lafonda Mosses

## 2023-03-09 NOTE — Assessment & Plan Note (Signed)
Discussed as above.  Continue levothyroxine 50 mcg daily. Repeat TSH at next visit

## 2023-03-09 NOTE — Assessment & Plan Note (Signed)
Her 2021 DEXA scan did show osteopenia.  Patient is due for repeat DEXA scan.  She has history of osteoporosis but not on a bisphosphonate therapy.  She has a point tenderness around T10-T11 spinous process that suspicious for compression fracture.  Will obtain x-ray of the thoracic spine.  If confirmed compression fracture, she will need to be on a bisphosphonate therapy.  Patient will contact the imaging center for repeat DEXA scan.

## 2023-03-10 LAB — CBC
Hematocrit: 40.1 % (ref 34.0–46.6)
Hemoglobin: 13.7 g/dL (ref 11.1–15.9)
MCH: 32.5 pg (ref 26.6–33.0)
MCHC: 34.2 g/dL (ref 31.5–35.7)
MCV: 95 fL (ref 79–97)
Platelets: 226 10*3/uL (ref 150–450)
RBC: 4.21 x10E6/uL (ref 3.77–5.28)
RDW: 13 % (ref 11.7–15.4)
WBC: 4.3 10*3/uL (ref 3.4–10.8)

## 2023-03-10 LAB — VITAMIN D 25 HYDROXY (VIT D DEFICIENCY, FRACTURES): Vit D, 25-Hydroxy: 27.2 ng/mL — ABNORMAL LOW (ref 30.0–100.0)

## 2023-04-26 ENCOUNTER — Other Ambulatory Visit: Payer: Self-pay | Admitting: Student

## 2023-04-26 DIAGNOSIS — M797 Fibromyalgia: Secondary | ICD-10-CM

## 2023-04-26 MED ORDER — TRAMADOL HCL 50 MG PO TABS: 25.0000 mg | ORAL_TABLET | Freq: Two times a day (BID) | ORAL | 2 refills | Status: DC

## 2023-04-26 NOTE — Telephone Encounter (Signed)
  traMADol (ULTRAM) 50 MG tablet     [id number] [place], [place] - [address] AT [facility] & [address] (Ph: [phone])

## 2023-04-26 NOTE — Telephone Encounter (Signed)
LOV - 03/09/23. Next appt - 7/11 with PCP.

## 2023-05-03 ENCOUNTER — Other Ambulatory Visit: Payer: Self-pay

## 2023-05-03 DIAGNOSIS — M797 Fibromyalgia: Secondary | ICD-10-CM

## 2023-05-05 ENCOUNTER — Encounter: Payer: Self-pay | Admitting: Student

## 2023-05-05 ENCOUNTER — Ambulatory Visit (INDEPENDENT_AMBULATORY_CARE_PROVIDER_SITE_OTHER): Payer: Medicare Other | Admitting: Student

## 2023-05-05 ENCOUNTER — Other Ambulatory Visit: Payer: Self-pay

## 2023-05-05 VITALS — BP 129/82 | HR 91 | Temp 98.2°F | Resp 32 | Ht 65.0 in | Wt 173.4 lb

## 2023-05-05 DIAGNOSIS — E039 Hypothyroidism, unspecified: Secondary | ICD-10-CM

## 2023-05-05 DIAGNOSIS — M797 Fibromyalgia: Secondary | ICD-10-CM | POA: Diagnosis not present

## 2023-05-05 DIAGNOSIS — E559 Vitamin D deficiency, unspecified: Secondary | ICD-10-CM

## 2023-05-05 NOTE — Progress Notes (Signed)
CC: F/u on hypothyroidism  HPI:  [name] is a 77 y.o. female living with a history stated below and presents today for f/u on hypothyroidism. Please see problem based assessment and plan for additional details.  Past Medical History:  Diagnosis Date   Allergy    Arthritis    Asthma    Chest pain of uncertain etiology 03/25/2021   Chronic pain syndrome 08/05/2017   Essential hypertension 05/27/2021   Fibromyalgia    Fibromyalgia muscle pain    Neuromuscular disorder (HCC)    Osteoporosis    Right foot pain 07/29/2022   Patient has localized pain to medial aspect of right heel.  There is no visible swelling, redness or deformity.  After the physical examination I do not anticipate a fracture or acute bone pathology.  I will prescribe topical Voltaren gel for symptomatic management.  We will reevaluate the patient on the follow-up visit.   Thyroid disease     Current Outpatient Medications on File Prior to Visit  Medication Sig Dispense Refill   B Complex Vitamins (VITAMIN B COMPLEX PO) Take 1 tablet by mouth daily.     Coenzyme Q10 50 MG CHEW Chew 100 mg by mouth daily.     diclofenac Sodium (VOLTAREN) 1 % GEL Apply 2 g topically 4 (four) times daily. 100 g 0   levothyroxine (SYNTHROID) 50 MCG tablet TAKE 1 TABLET BY MOUTH EVERY DAY BEFORE BREAKFAST 90 tablet 3   loratadine (CLARITIN) 10 MG tablet Take 10 mg by mouth daily.     traMADol (ULTRAM) 50 MG tablet Take 0.5 tablets (25 mg total) by mouth 2 (two) times daily. 30 tablet 2   triamcinolone cream (KENALOG) 0.1 % Apply 1 application. topically 2 (two) times daily. 30 g 0   No current facility-administered medications on file prior to visit.   Review of Systems: ROS negative except for what is noted on the assessment and plan.  Vitals:   05/05/23 1534  BP: 129/82  Pulse: 91  Resp: (!) 32  Temp: 98.2 F (36.8 C)  TempSrc: Oral  SpO2: 98%  Weight: 173 lb 6.4 oz (78.7 kg)  Height: 5\' 5"  (1.651 m)   Physical  Exam: Constitutional: well-appearing female sitting in chair comfortably, in no acute distress Neck: no tenderness or enlargement of thyroid Cardiovascular: regular rate and rhythm Pulmonary/Chest: non-labored, normal work of breathing on room air Neurological: alert & oriented x 3 Skin: warm and dry Psych: pleasant mood  Assessment & Plan:   Fibromyalgia Patient expressed frustration today due to unable to pick up tramadol from her pharmacy.  States that she is on chronic tramadol for her fibromyalgia with good control.  Reports taking 1/2 tablet (25 mg) daily and at times twice daily.  I called her pharmacy today and they said that the tramadol prescription is ready for pickup.  Will make patient aware that is ready for pickup.  Vitamin D deficiency Last vitamin D level was 27 in May.  Reports taking vitamin D3 supplementation.  Encouraged her to continue taking it.  Hypothyroidism Patient reports diagnosis of hypothyroidism in early 2000's at her prior PCP.  Stated she has been on levothyroxine since then.  However she has not been adherent to the medications and at times missed several doses.  However, her prior TSH levels on her chart have been in normal range for past few years. At last visit in May, addressed chronic fatigue and advised patient to take levothyroxine consistently.  TSH was normal  today.  She is continuing levothyroxine 50 mcg every morning.   Patient discussed with Dr. Layne Benton, D.O. [facility], PGY-2 Phone: [phone] Date 05/05/2023 Time 4:10 PM

## 2023-05-05 NOTE — Patient Instructions (Addendum)
Thank you, [name] for allowing [place] to provide your care today.  -Will check on Tramadol at your local pharmacy about refill.  -Will do blood work today to check your thyroid levels. Continue taking levothyroxine every morning.  -Continue with the vitamin D supplement.  I have ordered the following labs for you:  Lab Orders         TSH      Follow up:  3 months    Should you have any questions or concerns please call the internal medicine clinic at [phone].    Rana Snare, D.O. [facility]

## 2023-05-06 LAB — TSH: TSH: 1.15 u[IU]/mL (ref 0.450–4.500)

## 2023-05-06 NOTE — Assessment & Plan Note (Signed)
Last vitamin D level was 27 in May.  Reports taking vitamin D3 supplementation.  Encouraged her to continue taking it.

## 2023-05-06 NOTE — Assessment & Plan Note (Addendum)
Patient reports diagnosis of hypothyroidism in early 2000's at her prior PCP.  Stated she has been on levothyroxine since then.  However she has not been adherent to the medications and at times missed several doses.  However, her prior TSH levels on her chart have been in normal range for past few years. At last visit in May, addressed chronic fatigue and advised patient to take levothyroxine consistently.  TSH was normal today.  She is continuing levothyroxine 50 mcg every morning.

## 2023-05-06 NOTE — Assessment & Plan Note (Signed)
Patient expressed frustration today due to unable to pick up tramadol from her pharmacy.  States that she is on chronic tramadol for her fibromyalgia with good control.  Reports taking 1/2 tablet (25 mg) daily and at times twice daily.  I called her pharmacy today and they said that the tramadol prescription is ready for pickup.  Will make patient aware that is ready for pickup.

## 2023-05-09 MED ORDER — TRAMADOL HCL 50 MG PO TABS: 25.0000 mg | ORAL_TABLET | Freq: Two times a day (BID) | ORAL | 2 refills | Status: DC

## 2023-05-09 NOTE — Telephone Encounter (Signed)
[facility] was called and informed Dr Cyndie Chime has graduated. She stated rx per Dr Oswaldo Done went thru and ready for pt to pick up.

## 2023-05-12 NOTE — Progress Notes (Signed)
Internal Medicine Clinic Attending  Case discussed with the resident at the time of the visit.  We reviewed the resident's history and exam and pertinent patient test results.  I agree with the assessment, diagnosis, and plan of care documented in the resident's note.   Could consider a slow synthroid taper with serial TSH every 4-6 weeks to see if tx is still needed. Sounds like her diagnosis was many years ago, we do not have access to those records. It is possible her thyroid function has recovered. TSH has remained in normal range since 2013 despite inconsistent compliance. In fact, I do not see that she has ever had a TSH out of range. Would discuss with patient if she is interested in coming off this medication.

## 2023-08-24 ENCOUNTER — Other Ambulatory Visit: Payer: Self-pay | Admitting: Student

## 2023-08-24 DIAGNOSIS — M797 Fibromyalgia: Secondary | ICD-10-CM

## 2023-09-28 ENCOUNTER — Ambulatory Visit: Payer: Medicare Other

## 2023-09-28 VITALS — Ht 65.0 in | Wt 173.0 lb

## 2023-09-28 DIAGNOSIS — Z0000 Encounter for general adult medical examination without abnormal findings: Secondary | ICD-10-CM

## 2023-09-28 NOTE — Patient Instructions (Addendum)
Ms. Bayles , Thank you for taking time to come for your Medicare Wellness Visit. I appreciate your ongoing commitment to your health goals. Please review the following plan we discussed and let me know if I can assist you in the future.   Referrals/Orders/Follow-Ups/Clinician Recommendations: Rhoderick Moody re due for a EGG FREE Flu vaccine and a Shingles vaccine.  You are also due for a Mammogram and a Bone Density screening, which you will discuss with your provider during your next office visit.  Each day, aim for 6 glasses of water, plenty of protein in your diet and try to get up and walk/ stretch every hour for 5-10 minutes at a time.    This is a list of the screening recommended for you and due dates:  Health Maintenance  Topic Date Due   Zoster (Shingles) Vaccine (1 of 2) 12/23/2023*   Flu Shot  01/22/2026*   Medicare Annual Wellness Visit  09/27/2024   Pneumonia Vaccine  Completed   DEXA scan (bone density measurement)  Completed   Hepatitis C Screening  Completed   HPV Vaccine  Aged Out   DTaP/Tdap/Td vaccine  Discontinued   COVID-19 Vaccine  Discontinued  *Topic was postponed. The date shown is not the original due date.    Advanced directives: (Declined) Advance directive discussed with you today. Even though you declined this today, please call our office should you change your mind, and we can give you the proper paperwork for you to fill out.  Next Medicare Annual Wellness Visit scheduled for next year: Yes

## 2023-09-28 NOTE — Progress Notes (Signed)
Subjective:   Jacqueline Wyatt is a 77 y.o. female who presents for Medicare Annual (Subsequent) preventive examination.  Visit Complete: Virtual I connected with  Jacqueline Wyatt on 09/28/23 by a audio enabled telemedicine application and verified that I am speaking with the correct person using two identifiers.  Patient Location: Home  Provider Location: Home Office  I discussed the limitations of evaluation and management by telemedicine. The patient expressed understanding and agreed to proceed.  Vital Signs: Because this visit was a virtual/telehealth visit, some criteria may be missing or patient reported. Any vitals not documented were not able to be obtained and vitals that have been documented are patient reported.  Cardiac Risk Factors include: advanced age (>55men, >47 women);Other (see comment);dyslipidemia, Risk factor comments: Fibromyalgia     Objective:    Today's Vitals   09/28/23 1303  Weight: 173 lb (78.5 kg)  Height: 5\' 5"  (1.651 m)   Body mass index is 28.79 kg/m.     09/28/2023    1:26 PM 05/05/2023    3:40 PM 03/09/2023    2:24 PM 12/09/2022    2:30 PM 09/09/2022    1:25 PM 08/12/2022    2:30 PM 07/29/2022    4:00 PM  Advanced Directives  Does Patient Have a Medical Advance Directive? No No No No No No No  Would patient like information on creating a medical advance directive?  No - Patient declined No - Patient declined No - Patient declined No - Patient declined No - Patient declined No - Patient declined    Current Medications (verified) Outpatient Encounter Medications as of 09/28/2023  Medication Sig   B Complex Vitamins (VITAMIN B COMPLEX PO) Take 1 tablet by mouth daily.   Coenzyme Q10 50 MG CHEW Chew 100 mg by mouth daily.   diclofenac Sodium (VOLTAREN) 1 % GEL Apply 2 g topically 4 (four) times daily.   levothyroxine (SYNTHROID) 50 MCG tablet TAKE 1 TABLET BY MOUTH EVERY DAY BEFORE BREAKFAST   loratadine (CLARITIN) 10 MG tablet Take 10 mg by mouth  daily.   traMADol (ULTRAM) 50 MG tablet Take 0.5 tablets (25 mg total) by mouth 2 (two) times daily.   triamcinolone cream (KENALOG) 0.1 % Apply 1 application. topically 2 (two) times daily.   No facility-administered encounter medications on file as of 09/28/2023.    Allergies (verified) Almotriptan malate, Banana, Egg-derived products, Imitrex [sumatriptan base], Pear, Penicillins, Relpax [eletriptan hydrobromide], Sulfa drugs cross reactors, Vantin, Watermelon flavor, and Azithromycin   History: Past Medical History:  Diagnosis Date   Allergy    Arthritis    Asthma    Chest pain of uncertain etiology 03/25/2021   Chronic pain syndrome 08/05/2017   Essential hypertension 05/27/2021   Fibromyalgia    Fibromyalgia muscle pain    Neuromuscular disorder (HCC)    Osteoporosis    Right foot pain 07/29/2022   Patient has localized pain to medial aspect of right heel.  There is no visible swelling, redness or deformity.  After the physical examination I do not anticipate a fracture or acute bone pathology.  I will prescribe topical Voltaren gel for symptomatic management.  We will reevaluate the patient on the follow-up visit.   Thyroid disease    Past Surgical History:  Procedure Laterality Date   LEFT HEART CATH AND CORONARY ANGIOGRAPHY N/A 03/24/2021   Procedure: LEFT HEART CATH AND CORONARY ANGIOGRAPHY;  Surgeon: Corky Crafts, MD;  Location: [facility] INVASIVE CV LAB;  Service: Cardiovascular;  Laterality: N/A;  Family History  Problem Relation Age of Onset   Heart disease Mother    Hypertension Mother    Hyperlipidemia Mother    Hearing loss Mother    Kidney disease Mother    Vision loss Mother    Lung disease Mother    Stroke Father    Arthritis Sister    Meniere's disease Brother    Cancer Maternal Grandmother    Stroke Maternal Grandmother    Heart disease Maternal Grandfather    Heart disease Maternal Uncle    Social History   Socioeconomic History   Marital  status: Single    Spouse name: Not on file   Number of children: 0   Years of education: Not on file   Highest education level: Not on file  Occupational History   Occupation: Retired  Tobacco Use   Smoking status: Never   Smokeless tobacco: Never  Vaping Use   Vaping status: Never Used  Substance and Sexual Activity   Alcohol use: Not Currently    Comment: rarly wine   Drug use: Never   Sexual activity: Not on file  Other Topics Concern   Not on file  Social History Narrative   Lives alone with 6 cats   Social Determinants of Health   Financial Resource Strain: [place] Risk  (09/28/2023)   Overall Financial Resource Strain (CARDIA)    Difficulty of Paying Living Expenses: Not hard at all  Food Insecurity: No Food Insecurity (09/28/2023)   Hunger Vital Sign    Worried About Running Out of Food in the Last Year: Never true    Ran Out of Food in the Last Year: Never true  Transportation Needs: No Transportation Needs (09/28/2023)   PRAPARE - Administrator, Civil Service (Medical): No    Lack of Transportation (Non-Medical): No  Physical Activity: Inactive (09/28/2023)   Exercise Vital Sign    Days of Exercise per Week: 0 days    Minutes of Exercise per Session: 0 min  Stress: Stress Concern Present (09/28/2023)   [employer] of Occupational Health - Occupational Stress Questionnaire    Feeling of Stress : To some extent  Social Connections: Socially Isolated (09/28/2023)   Social Connection and Isolation Panel [NHANES]    Frequency of Communication with Friends and Family: Once a week    Frequency of Social Gatherings with Friends and Family: Never    Attends Religious Services: Never    Database administrator or Organizations: Yes    Attends Engineer, structural: 1 to 4 times per year    Marital Status: Never married    Tobacco Counseling Counseling given: Not Answered   Clinical Intake:  Pre-visit preparation completed: Yes  Pain :  No/denies pain     BMI - recorded: 28.79 Nutritional Status: BMI 25 -29 Overweight Nutritional Risks: Nausea/ vomitting/ diarrhea (due to IBS) Diabetes: No  How often do you need to have someone help you when you read instructions, pamphlets, or other written materials from your doctor or pharmacy?: 1 - Never  Interpreter Needed?: No  Information entered by :: Duquan Gillooly, RMA   Activities of Daily Living    09/28/2023    1:17 PM 05/05/2023    3:33 PM  In your present state of health, do you have any difficulty performing the following activities:  Hearing? 1   Comment mild to moderate hearing loss   Vision? 0   Difficulty concentrating or making decisions? 0   Walking or  climbing stairs? 1 1  Comment  tires  Dressing or bathing? 0 0  Doing errands, shopping? 0 0  Preparing Food and eating ? N   Using the Toilet? N   In the past six months, have you accidently leaked urine? N   Do you have problems with loss of bowel control? N   Managing your Medications? N   Managing your Finances? N   Housekeeping or managing your Housekeeping? N     Patient Care Team: Rana Snare, DO as PCP - General Jens Som Madolyn Frieze, MD as PCP - Cardiology (Cardiology)  Indicate any recent Medical Services you may have received from other than [facility] providers in the past year (date may be approximate).     Assessment:   This is a routine wellness examination for Jacqueline Wyatt.  Hearing/Vision screen Hearing Screening - Comments:: mild to moderate hearing loss Vision Screening - Comments:: Denies vision issues   Goals Addressed             This Visit's Progress    COMPLETED: Weight (lb) < 200 lb (90.7 kg)   173 lb (78.5 kg)    Patient would like to loose weight/ still working on losing more      Depression Screen    09/28/2023    1:32 PM 03/09/2023    2:30 PM 12/09/2022    2:30 PM 09/09/2022    3:21 PM 07/29/2022    5:36 PM 11/17/2020    1:37 PM 07/03/2020    2:07 PM  PHQ 2/9 Scores   PHQ - 2 Score 0 0 0 1 2 0 0  PHQ- 9 Score 2 7 6 8 10       Fall Risk    09/28/2023    1:26 PM 03/09/2023    2:24 PM 12/09/2022    2:30 PM 09/09/2022    1:24 PM 08/12/2022    2:31 PM  Fall Risk   Falls in the past year? 1 0 0 0 0  Number falls in past yr: 0 0  0 0  Injury with Fall? 0 0  0 0  Risk for fall due to : No Fall Risks No Fall Risks No Fall Risks No Fall Risks   Follow up Falls evaluation completed;Falls prevention discussed  Falls evaluation completed Falls evaluation completed;Falls prevention discussed Falls evaluation completed    MEDICARE RISK AT HOME: Medicare Risk at Home Any stairs in or around the home?: Yes If so, are there any without handrails?: Yes Home free of loose throw rugs in walkways, pet beds, electrical cords, etc?: Yes Adequate lighting in your home to reduce risk of falls?: Yes Life alert?: No Use of a cane, walker or w/c?: Yes (cane sometimes) Grab bars in the bathroom?: Yes Shower chair or bench in shower?: Yes Elevated toilet seat or a handicapped toilet?: Yes  TIMED UP AND GO:  Was the test performed?  No    Cognitive Function:        09/28/2023    1:28 PM 07/03/2020    2:05 PM 03/08/2019    1:19 PM  6CIT Screen  What Year? 0 points 0 points 0 points  What month? 0 points 0 points 0 points  What time? 0 points 0 points 0 points  Count back from 20 0 points 0 points 0 points  Months in reverse 0 points 0 points 0 points  Repeat phrase 0 points  0 points  Total Score 0 points  0 points    Immunizations  Immunization History  Administered Date(s) Administered   MMR 11/23/2009   PFIZER Comirnaty(Gray Top)Covid-19 Tri-Sucrose Vaccine 11/25/2020   PFIZER(Purple Top)SARS-COV-2 Vaccination 03/02/2020, 04/02/2020   Pfizer Covid-19 Vaccine Bivalent Booster 98yrs & up 11/05/2021   Pneumococcal Conjugate-13 09/09/2014   Pneumococcal Polysaccharide-23 05/01/2020   Td 11/23/2002   Tdap 07/31/2012    TDAP status: Due, Education has been  provided regarding the importance of this vaccine. Advised may receive this vaccine at local pharmacy or Health Dept. Aware to provide a copy of the vaccination record if obtained from local pharmacy or Health Dept. Verbalized acceptance and understanding.  Flu Vaccine status: Due, Education has been provided regarding the importance of this vaccine. Advised may receive this vaccine at local pharmacy or Health Dept. Aware to provide a copy of the vaccination record if obtained from local pharmacy or Health Dept. Verbalized acceptance and understanding.  Pneumococcal vaccine status: Up to date  Covid-19 vaccine status: Information provided on how to obtain vaccines.   Qualifies for Shingles Vaccine? Yes   Zostavax completed No   Shingrix Completed?: No.    Education has been provided regarding the importance of this vaccine. Patient has been advised to call insurance company to determine out of pocket expense if they have not yet received this vaccine. Advised may also receive vaccine at local pharmacy or Health Dept. Verbalized acceptance and understanding.  Screening Tests Health Maintenance  Topic Date Due   Zoster Vaccines- Shingrix (1 of 2) 12/23/2023 (Originally 04/07/1996)   INFLUENZA VACCINE  01/22/2026 (Originally 05/26/2023)   Medicare Annual Wellness (AWV)  09/27/2024   Pneumonia Vaccine 25+ Years old  Completed   DEXA SCAN  Completed   Hepatitis C Screening  Completed   HPV VACCINES  Aged Out   DTaP/Tdap/Td  Discontinued   COVID-19 Vaccine  Discontinued    Health Maintenance  There are no preventive care reminders to display for this patient.   Colorectal cancer screening: No longer required.   Mammogram status: Ordered N/A. Pt provided with contact info and advised to call to schedule appt.   Bone Density status: Ordered N/A. Pt provided with contact info and advised to call to schedule appt.  Lung Cancer Screening: ([place] Dose CT Chest recommended if Age 6-80 years, 20  pack-year currently smoking OR have quit w/in 15years.) does not qualify.   Lung Cancer Screening Referral: N/A  Additional Screening:  Hepatitis C Screening: does qualify; Completed 10/25/2002  Vision Screening: Recommended annual ophthalmology exams for early detection of glaucoma and other disorders of the eye. Is the patient up to date with their annual eye exam?  No  Who is the provider or what is the name of the office in which the patient attends annual eye exams? N/A If pt is not established with a provider, would they like to be referred to a provider to establish care? Yes .   Dental Screening: Recommended annual dental exams for proper oral hygiene   Community Resource Referral / Chronic Care Management: CRR required this visit?  No   CCM required this visit?  No     Plan:     I have personally reviewed and noted the following in the patient's chart:   Medical and social history Use of alcohol, tobacco or illicit drugs  Current medications and supplements including opioid prescriptions. Patient is currently taking opioid prescriptions. Information provided to patient regarding non-opioid alternatives. Patient advised to discuss non-opioid treatment plan with their provider. Functional ability and status Nutritional status Physical activity  Advanced directives List of other physicians Hospitalizations, surgeries, and ER visits in previous 12 months Vitals Screenings to include cognitive, depression, and falls Referrals and appointments  In addition, I have reviewed and discussed with patient certain preventive protocols, quality metrics, and best practice recommendations. A written personalized care plan for preventive services as well as general preventive health recommendations were provided to patient.     Kerrington Greenhalgh L Deniqua Perry, CMA   09/28/2023   After Visit Summary: (Mail) Due to this being a telephonic visit, the after visit summary with patients personalized  plan was offered to patient via mail   Nurse Notes: Patient is due for a EGG FREE Flu vaccine.  She is due for a Shingrix vaccine, however she will think about getting it.  She is declining the Tdap. Patient is due for a mammogram and a DEXA and would like an order to be placed during next office visit.  Patient stated that she will call to schedule an office visit.  Patient had no other concerns to address today.

## 2023-10-04 ENCOUNTER — Telehealth: Payer: Self-pay | Admitting: Student

## 2023-10-04 NOTE — Telephone Encounter (Signed)
Next appt scheduled 11/01/23 with PCP.

## 2023-10-04 NOTE — Telephone Encounter (Signed)
traMADol (ULTRAM) 50 MG tablet    [id number] - [place], [place] - [address] AT [facility] [address]

## 2023-11-01 ENCOUNTER — Encounter: Payer: Self-pay | Admitting: Student

## 2023-11-01 ENCOUNTER — Ambulatory Visit: Payer: Medicare Other | Admitting: Student

## 2023-11-01 VITALS — BP 137/84 | HR 77 | Temp 97.9°F | Ht 65.0 in | Wt 178.3 lb

## 2023-11-01 DIAGNOSIS — Z9109 Other allergy status, other than to drugs and biological substances: Secondary | ICD-10-CM | POA: Diagnosis not present

## 2023-11-01 DIAGNOSIS — E559 Vitamin D deficiency, unspecified: Secondary | ICD-10-CM | POA: Diagnosis not present

## 2023-11-01 DIAGNOSIS — M810 Age-related osteoporosis without current pathological fracture: Secondary | ICD-10-CM | POA: Diagnosis not present

## 2023-11-01 DIAGNOSIS — M797 Fibromyalgia: Secondary | ICD-10-CM

## 2023-11-01 NOTE — Assessment & Plan Note (Signed)
 Reports environmental allergies which she takes loratadine 10 mg daily.  Reports at times not effective.  Advised patient to trial different oral antihistamine such as Zyrtec or Allegra which she agrees to.

## 2023-11-01 NOTE — Assessment & Plan Note (Signed)
 Continues to take vitamin D3 supplementation daily.  Last level was at 27 in 02/2023.  Plan for recheck level at next OV.

## 2023-11-01 NOTE — Progress Notes (Signed)
 CC: Fibromyalgia  HPI:  [name] is a 78 y.o. female living with a history stated below and presents today for fibromyalgia. Please see problem based assessment and plan for additional details.  Past Medical History:  Diagnosis Date   Allergy    Arthritis    Asthma    Chest pain of uncertain etiology 03/25/2021   Chronic pain syndrome 08/05/2017   Essential hypertension 05/27/2021   Fibromyalgia    Fibromyalgia muscle pain    Neuromuscular disorder (HCC)    Osteoporosis    Right foot pain 07/29/2022   Patient has localized pain to medial aspect of right heel.  There is no visible swelling, redness or deformity.  After the physical examination I do not anticipate a fracture or acute bone pathology.  I will prescribe topical Voltaren  gel for symptomatic management.  We will reevaluate the patient on the follow-up visit.   Thyroid  disease     Current Outpatient Medications on File Prior to Visit  Medication Sig Dispense Refill   cholecalciferol (VITAMIN D3) 25 MCG (1000 UNIT) tablet Take 1,000 Units by mouth daily.     B Complex Vitamins (VITAMIN B COMPLEX PO) Take 1 tablet by mouth daily.     Coenzyme Q10 50 MG CHEW Chew 100 mg by mouth daily.     diclofenac  Sodium (VOLTAREN ) 1 % GEL Apply 2 g topically 4 (four) times daily. 100 g 0   levothyroxine  (SYNTHROID ) 50 MCG tablet TAKE 1 TABLET BY MOUTH EVERY DAY BEFORE BREAKFAST 90 tablet 3   loratadine  (CLARITIN ) 10 MG tablet Take 10 mg by mouth daily.     traMADol  (ULTRAM ) 50 MG tablet Take 0.5 tablets (25 mg total) by mouth 2 (two) times daily. 30 tablet 2   triamcinolone  cream (KENALOG ) 0.1 % Apply 1 application. topically 2 (two) times daily. 30 g 0   No current facility-administered medications on file prior to visit.    Family History  Problem Relation Age of Onset   Heart disease Mother    Hypertension Mother    Hyperlipidemia Mother    Hearing loss Mother    Kidney disease Mother    Vision loss Mother    Lung  disease Mother    Stroke Father    Arthritis Sister    Meniere's disease Brother    Cancer Maternal Grandmother    Stroke Maternal Grandmother    Heart disease Maternal Grandfather    Heart disease Maternal Uncle     Social History   Socioeconomic History   Marital status: Single    Spouse name: Not on file   Number of children: 0   Years of education: Not on file   Highest education level: Not on file  Occupational History   Occupation: Retired  Tobacco Use   Smoking status: Never   Smokeless tobacco: Never  Vaping Use   Vaping status: Never Used  Substance and Sexual Activity   Alcohol use: Not Currently    Comment: rarly wine   Drug use: Never   Sexual activity: Not on file  Other Topics Concern   Not on file  Social History Narrative   Lives alone with 6 cats   Social Drivers of Health   Financial Resource Strain: [place] Risk  (09/28/2023)   Overall Financial Resource Strain (CARDIA)    Difficulty of Paying Living Expenses: Not hard at all  Food Insecurity: No Food Insecurity (09/28/2023)   Hunger Vital Sign    Worried About Running Out of Food  in the Last Year: Never true    Ran Out of Food in the Last Year: Never true  Transportation Needs: No Transportation Needs (09/28/2023)   PRAPARE - Administrator, Civil Service (Medical): No    Lack of Transportation (Non-Medical): No  Physical Activity: Inactive (09/28/2023)   Exercise Vital Sign    Days of Exercise per Week: 0 days    Minutes of Exercise per Session: 0 min  Stress: Stress Concern Present (09/28/2023)   [employer] of Occupational Health - Occupational Stress Questionnaire    Feeling of Stress : To some extent  Social Connections: Socially Isolated (09/28/2023)   Social Connection and Isolation Panel [NHANES]    Frequency of Communication with Friends and Family: Once a week    Frequency of Social Gatherings with Friends and Family: Never    Attends Religious Services: Never    Automotive Engineer or Organizations: Yes    Attends Banker Meetings: 1 to 4 times per year    Marital Status: Never married  Intimate Partner Violence: Patient Unable To Answer (09/28/2023)   Humiliation, Afraid, Rape, and Kick questionnaire    Fear of Current or Ex-Partner: Patient unable to answer    Emotionally Abused: Patient unable to answer    Physically Abused: Patient unable to answer    Sexually Abused: Patient unable to answer    Review of Systems: ROS negative except for what is noted on the assessment and plan.  Vitals:   11/01/23 1500  BP: 137/84  Pulse: 77  Temp: 97.9 F (36.6 C)  TempSrc: Oral  SpO2: 94%  Weight: 178 lb 4.8 oz (80.9 kg)  Height: 5' 5 (1.651 m)   Physical Exam: Constitutional: alert, female sitting in chair, in no acute distress Cardiovascular: regular rate and rhythm Pulmonary/Chest: normal work of breathing on room air Neurological: alert & oriented x 3  Assessment & Plan:   Osteoporosis Last DXA scan in 2021 showed lowest T-score -2.0 which is improved from 2015 with lowest T-score -2.7 (right femoral neck).  Has declined bisphosphonate therapy in the past and declined today.  Continues to take vitamin D  supplementation daily.  Denies any recent falls or history of fractures.  Plan -Repeat DXA ordered -Continue vitamin D  supplementation  Vitamin D  deficiency Continues to take vitamin D3 supplementation daily.  Last level was at 27 in 02/2023.  Plan for recheck level at next OV.  Fibromyalgia History of fibromyalgia for 20+ years.  She is taking tramadol  25 mg daily but at times twice a day.  Endorses chronic muscle aches and pains.  Able to function fairly okay at home.  States tramadol  improves her function status to about 60-70% from 30% if not on tramadol .  Has trialed Cymbalta  but stopped due to side effects.  Discussed starting PT which patient agrees to.  Can consider adding on gabapentin/pregabalin if  worsening.  Plan -Continue tramadol  25 mg daily (has refills) -Referral for PT today  Environmental allergies Reports environmental allergies which she takes loratadine  10 mg daily.  Reports at times not effective.  Advised patient to trial different oral antihistamine such as Zyrtec or Allegra which she agrees to.   Patient discussed with Dr. Machen  Ziv Welchel, D.O. [facility] Internal Medicine, PGY-2 Phone: [phone] Date 11/01/2023 Time 5:03 PM

## 2023-11-01 NOTE — Patient Instructions (Addendum)
 Thank you, [name] for allowing us  to provide your care today. Today we discussed   -Referral for physical therapy, continue with staying active at home as well -Refills available for the tramadol  to help with your fibromyalgia -Bone scan ordered today to be completed at [facility] -Continue taking your vitamin D3 supplement daily  -You can try Zyrtec if Claritin  has not been helping with your environmental allergies.   Referrals ordered today:  Referral Orders         Ambulatory referral to Physical Therapy      Follow up:  6 months    Should you have any questions or concerns please call the internal medicine clinic at [phone].    Jacqueline Wyatt, D.O. [facility]

## 2023-11-01 NOTE — Assessment & Plan Note (Signed)
 History of fibromyalgia for 20+ years.  She is taking tramadol  25 mg daily but at times twice a day.  Endorses chronic muscle aches and pains.  Able to function fairly okay at home.  States tramadol  improves her function status to about 60-70% from 30% if not on tramadol .  Has trialed Cymbalta  but stopped due to side effects.  Discussed starting PT which patient agrees to.  Can consider adding on gabapentin/pregabalin if worsening.  Plan -Continue tramadol  25 mg daily (has refills) -Referral for PT today

## 2023-11-01 NOTE — Assessment & Plan Note (Addendum)
 Last DXA scan in 2021 showed lowest T-score -2.0 which is improved from 2015 with lowest T-score -2.7 (right femoral neck).  Has declined bisphosphonate therapy in the past and declined today.  Continues to take vitamin D  supplementation daily.  Denies any recent falls or history of fractures.  Plan -Repeat DXA ordered -Continue vitamin D  supplementation

## 2023-11-02 NOTE — Progress Notes (Signed)
 Internal Medicine Clinic Attending  Case discussed with the resident at the time of the visit.  We reviewed the resident's history and exam and pertinent patient test results.  I agree with the assessment, diagnosis, and plan of care documented in the resident's note.

## 2023-11-23 ENCOUNTER — Ambulatory Visit: Payer: Medicare Other | Attending: Internal Medicine | Admitting: Physical Therapy

## 2023-11-23 ENCOUNTER — Encounter: Payer: Self-pay | Admitting: Physical Therapy

## 2023-11-23 DIAGNOSIS — M797 Fibromyalgia: Secondary | ICD-10-CM | POA: Insufficient documentation

## 2023-11-23 NOTE — Therapy (Addendum)
OUTPATIENT PHYSICAL THERAPY LOWER EXTREMITY EVALUATION   Patient Name: Jacqueline Wyatt MRN: [id number] DOB:October 17, 1946, 78 y.o., female Today's Date: 11/23/2023  END OF SESSION:  PT End of Session - 11/23/23 1505     Visit Number 1    Number of Visits 8    Date for PT Re-Evaluation 01/18/24    Authorization Type [employer] MCR    PT Start Time 1500    PT Stop Time 1550    PT Time Calculation (min) 50 min    Activity Tolerance Patient tolerated treatment well    Behavior During Therapy [facility] for tasks assessed/performed             Past Medical History:  Diagnosis Date   Allergy    Arthritis    Asthma    Chest pain of uncertain etiology 03/25/2021   Chronic pain syndrome 08/05/2017   Essential hypertension 05/27/2021   Fibromyalgia    Fibromyalgia muscle pain    Neuromuscular disorder (HCC)    Osteoporosis    Right foot pain 07/29/2022   Patient has localized pain to medial aspect of right heel.  There is no visible swelling, redness or deformity.  After the physical examination I do not anticipate a fracture or acute bone pathology.  I will prescribe topical Voltaren gel for symptomatic management.  We will reevaluate the patient on the follow-up visit.   Thyroid disease    Past Surgical History:  Procedure Laterality Date   LEFT HEART CATH AND CORONARY ANGIOGRAPHY N/A 03/24/2021   Procedure: LEFT HEART CATH AND CORONARY ANGIOGRAPHY;  Surgeon: Corky Crafts, MD;  Location: [facility] INVASIVE CV LAB;  Service: Cardiovascular;  Laterality: N/A;   Patient Active Problem List   Diagnosis Date Noted   Chronic fatigue 03/09/2023   Hearing impairment 09/09/2022   Dyslipidemia 11/07/2019   Vitamin D deficiency 08/05/2017   Hypothyroidism 11/24/2011   Environmental allergies 11/24/2011   Fibromyalgia 11/24/2011   Irritable bowel syndrome (IBS) 11/24/2011   Osteoporosis 11/24/2011    PCP: Rana Snare DO   REFERRING PROVIDER: Mercie Eon, MD  REFERRING DIAG: M79.7  (ICD-10-CM) - Fibromyalgia  THERAPY DIAG:  Fibromyalgia  Rationale for Evaluation and Treatment: Rehabilitation  ONSET DATE: 20 years   SUBJECTIVE:   SUBJECTIVE STATEMENT: I hurt all over. More stiffness and fatigue.  I feel all the muscle are tightened up.  I get a lot of hand cramps.  I limit my outings to 2-3 hours at a time.  Patient is referred for muscle cramping and joint stiffness but predominately fatigue. She used to enjoy walking but has not been able to do it in the cold. She is having RT hip/groin pain lately which limits her walking.  She has occ numbness in both hands.  She has neck and back pain as well.    PERTINENT HISTORY: H/o migraines  Fatigue  Osteopenia Fibromyalgia   PAIN:  Are you having pain? Yes: NPRS scale: not rated. Its a [place] level all the time.   Pain location: hip groin 2/10-3/10 aching to leg  Pain description: aching  Aggravating factors: walking  Relieving factors: rest   PRECAUTIONS: Other: osteopenia  RED FLAGS: None   WEIGHT BEARING RESTRICTIONS: No  FALLS:  Has patient fallen in last 6 months?  No just 1 near fall   LIVING ENVIRONMENT: Lives with: lives alone Lives in: House/apartment Stairs: Yes: Internal: 6 STE, 9 down the stairs split level  steps; on right going up Has following equipment at home: Single point  cane and Grab bars  OCCUPATION: retired in clinical lab   PLOF: Independent, Vocation/Vocational requirements: art degree, clinical lab work , Leisure: cats, and cared for her mother    PATIENT GOALS: I want to be able to move better and feel better   NEXT MD VISIT: unknown   OBJECTIVE:  Note: Objective measures were completed at Evaluation unless otherwise noted.  XR:  DIAGNOSTIC FINDINGS:  FINDINGS: No fracture or dislocation. No spondylolisthesis. Mild thoracic kyphosis. Vertebral endplate spurring at multiple levels in the mid and lower thoracic spine.   IMPRESSION: No acute findings. Multilevel  degenerative changes as above.  PATIENT SURVEYS:  Fatigue Assessment Scale given on eval   COGNITION: Overall cognitive status: Within functional limits for tasks assessed     SENSATION: WFL  EDEMA: None    MUSCLE LENGTH: Hamstrings: tight  Thomas test: tight   POSTURE: rounded shoulders, forward head, and increased thoracic kyphosis  PALPATION: Pain with palpation to Rt lateral and anterior hip and thigh, It feels "bruised"  LOWER EXTREMITY ROM:  Active ROM Right eval Left eval  Hip flexion Min pain WNL  WNL   Hip extension    Hip abduction    Hip adduction    Hip internal rotation Pain, WNL Pain WNL  Hip external rotation Pain, WNL  Pain WNL   Knee flexion WNL WNL   Knee extension WNL WNL   Ankle dorsiflexion    Ankle plantarflexion    Ankle inversion    Ankle eversion     (Blank rows = not tested)  Trunk motion: Flexion NT due bone density Extension WFL Rotation 25% each direction   LOWER EXTREMITY MMT:  MMT Right eval Left eval  Hip flexion 4 4  Hip extension    Hip abduction 3- 3-  Hip adduction    Hip internal rotation    Hip external rotation    Knee flexion 4 4  Knee extension 4 4  Ankle dorsiflexion    Ankle plantarflexion    Ankle inversion    Ankle eversion     (Blank rows = not tested)  LOWER EXTREMITY SPECIAL TESTS:  Hip special tests: Luisa Hart (FABER) test: positive  and Hip scouring test: positive   FUNCTIONAL TESTS:  5 times sit to stand: 16 sec  2 minute walk test: 473 feet, pain 1/10   GAIT: Distance walked: 473 Assistive device utilized: None Level of assistance: Complete Independence Comments: min increase in hip pain                                                                                                                                 TREATMENT DATE: 11/23/23  Pain with supine , hooklying Hamstring stretch Bridging LTR  Pt ed and self care, POC 2 min walk test    PATIENT EDUCATION:  Education  details: Fatigue, PT, POC  strengthening and walking for bone density , aquatics  Person educated: Patient Education  method: Explanation, Demonstration, and Handouts Education comprehension: verbalized understanding and needs further education  HOME EXERCISE PROGRAM: Access Code: [email or web address] URL: [email or web address] Date: 11/23/2023 Prepared by: Karie Mainland  Exercises - Hooklying Single Knee to Chest Stretch  - 1 x daily - 7 x weekly - 1 sets - 5 reps - 30 hold - Supine Bridge  - 1 x daily - 7 x weekly - 2 sets - 10 reps - 5 hold - Supine Lower Trunk Rotation  - 1 x daily - 7 x weekly - 2 sets - 10 reps - 10 hold - Standing Hip Abduction with Anterior Support  - 1 x daily - 7 x weekly - 2 sets - 10 reps - Standing Marching  - 1 x daily - 7 x weekly - 2 sets - 10 reps  ASSESSMENT:  CLINICAL IMPRESSION: Patient is a 78 y.o. female who was seen today for physical therapy evaluation and treatment for fatigue, polyarthralgia and myalgia .    OBJECTIVE IMPAIRMENTS: decreased activity tolerance, decreased endurance, decreased mobility, difficulty walking, decreased ROM, decreased strength, increased fascial restrictions, increased muscle spasms, impaired flexibility, impaired UE functional use, postural dysfunction, and pain.   ACTIVITY LIMITATIONS: carrying, lifting, bending, squatting, transfers, bed mobility, and locomotion level  PARTICIPATION LIMITATIONS: meal prep, cleaning, laundry, interpersonal relationship, shopping, and community activity  PERSONAL FACTORS: Behavior pattern, Time since onset of injury/illness/exacerbation, and 1-2 comorbidities: osteopenia, fatigue   are also affecting patient's functional outcome.   REHAB POTENTIAL: Excellent  CLINICAL DECISION MAKING: Stable/uncomplicated  EVALUATION COMPLEXITY: [place]   GOALS: Goals reviewed with patient? Yes  LONG TERM GOALS: Target date: 01/04/2024  Pt will be I with HEP for posture and general mobility   Baseline: unknown, given basic LE on eval  Goal status: INITIAL  2.  Pt will be able to report hip pain resolved when walking in her home  Baseline: 1/10-2/10 Goal status: INITIAL  3.  Patient will be able to begin walking (weather permitting) 20 min x 2-3 per week  Baseline: has not done this due to cold  Goal status: INITIAL  4.  Pt will be able to tolerate 30 min of standing, light home tasks before needing to sit or lie down  Baseline: 15 min  Goal status: INITIAL  5.  Fatigue Score will improve, goal TBA on 1st treatment (FAS)  Baseline: NT , given on eval  Goal status: INITIAL  PLAN:  PT FREQUENCY: 1-2x/week  PT DURATION: 6 weeks  PLANNED INTERVENTIONS: 97164- PT Re-evaluation, 97110-Therapeutic exercises, 97530- Therapeutic activity, 97535- Self Care, 54098- Manual therapy, [id number]- Aquatic Therapy, Patient/Family education, Cryotherapy, and Moist heat  PLAN FOR NEXT SESSION: NuStep, check HEP, posture- upper back.  Pt may be interested in Aquatics but does have some reservations.    Safia Panzer, PT 11/23/2023, 4:24 PM   Karie Mainland, PT 11/23/23 4:24 PM Phone: [phone] Fax: [phone]   Date of referral: 11/01/23 Referring provider: Mercie Eon, MD Referring diagnosis? M79.7 (ICD-10-CM) - Fibromyalgia Treatment diagnosis? (if different than referring diagnosis) cramp and spasm, polyarthralgia   What was this (referring dx) caused by? Ongoing Issue  Ashby Dawes of Condition: Chronic (continuous duration > 3 months)   Laterality: Both  Current Functional Measure Score: Other Pain  Objective measurements identify impairments when they are compared to normal values, the uninvolved extremity, and prior level of function.  [x]  Yes  []  No  Objective assessment of functional ability: Moderate functional limitations   Briefly describe symptoms: fatigue, excessive when considering activity , cramping  of muscles, R hip pain, abnormal posture   How did symptoms  start: gradual onset for 20 + years, progressive fatigue   Average pain intensity:  Last 24 hours: 2/10-3/10   Past week: 2/10-3/10   How often does the pt experience symptoms? Frequently  How much have the symptoms interfered with usual daily activities? Moderately  How has condition changed since care began at this facility? NA - initial visit  In general, how is the patients overall health? Very Good   BACK PAIN (STarT Back Screening Tool) No   Karie Mainland, PT 11/28/23 8:52 AM Phone: [phone] Fax: [phone]

## 2023-11-28 NOTE — Addendum Note (Signed)
Addended by: Lanell Persons on: 11/28/2023 04:31 PM   Modules accepted: Orders

## 2023-11-30 ENCOUNTER — Ambulatory Visit: Payer: Medicare Other | Attending: Internal Medicine | Admitting: Physical Therapy

## 2023-11-30 ENCOUNTER — Encounter: Payer: Self-pay | Admitting: Physical Therapy

## 2023-11-30 DIAGNOSIS — M797 Fibromyalgia: Secondary | ICD-10-CM

## 2023-11-30 NOTE — Therapy (Signed)
 OUTPATIENT PHYSICAL THERAPY NOTE   Patient Name: Jacqueline Wyatt MRN: [id number] DOB:1946-04-16, 78 y.o., female Today's Date: 11/30/2023  END OF SESSION:  PT End of Session - 11/30/23 1107     Visit Number 2    Number of Visits 8    Date for PT Re-Evaluation 01/18/24    Authorization Type U[employer] MCR    PT Start Time 1104    PT Stop Time 1145    PT Time Calculation (min) 41 min    Activity Tolerance Patient tolerated treatment well    Behavior During Therapy WFL for tasks assessed/performed;Anxious              Past Medical History:  Diagnosis Date   Allergy    Arthritis    Asthma    Chest pain of uncertain etiology 03/25/2021   Chronic pain syndrome 08/05/2017   Essential hypertension 05/27/2021   Fibromyalgia    Fibromyalgia muscle pain    Neuromuscular disorder (HCC)    Osteoporosis    Right foot pain 07/29/2022   Patient has localized pain to medial aspect of right heel.  There is no visible swelling, redness or deformity.  After the physical examination I do not anticipate a fracture or acute bone pathology.  I will prescribe topical Voltaren  gel for symptomatic management.  We will reevaluate the patient on the follow-up visit.   Thyroid  disease    Past Surgical History:  Procedure Laterality Date   LEFT HEART CATH AND CORONARY ANGIOGRAPHY N/A 03/24/2021   Procedure: LEFT HEART CATH AND CORONARY ANGIOGRAPHY;  Surgeon: Dann Candyce RAMAN, MD;  Location: [facility] INVASIVE CV LAB;  Service: Cardiovascular;  Laterality: N/A;   Patient Active Problem List   Diagnosis Date Noted   Chronic fatigue 03/09/2023   Hearing impairment 09/09/2022   Dyslipidemia 11/07/2019   Vitamin D  deficiency 08/05/2017   Hypothyroidism 11/24/2011   Environmental allergies 11/24/2011   Fibromyalgia 11/24/2011   Irritable bowel syndrome (IBS) 11/24/2011   Osteoporosis 11/24/2011    PCP: Elicia Sharper DO   REFERRING PROVIDER: Lovie Clarity, MD  REFERRING DIAG: M79.7 (ICD-10-CM) -  Fibromyalgia  THERAPY DIAG:  Fibromyalgia  Rationale for Evaluation and Treatment: Rehabilitation  ONSET DATE: 20 years   SUBJECTIVE:   SUBJECTIVE STATEMENT: I am uncomfortable.  It is like 5 am for me.  Some upper back pain, general stiffness and soreness. The fatigue is so bad its gotten worse.    PERTINENT HISTORY: H/o migraines  Fatigue  Osteopenia Fibromyalgia   PAIN:  Are you having pain? Yes: NPRS scale: not rated. Its a [place] level all the time.   Pain location: upper back, general   Pain description: aching  Aggravating factors: walking  Relieving factors: rest   PRECAUTIONS: Other: osteopenia  RED FLAGS: None   WEIGHT BEARING RESTRICTIONS: No  FALLS:  Has patient fallen in last 6 months?  No just 1 near fall   LIVING ENVIRONMENT: Lives with: lives alone Lives in: House/apartment Stairs: Yes: Internal: 6 STE, 9 down the stairs split level  steps; on right going up Has following equipment at home: Single point cane and Grab bars  OCCUPATION: retired in clinical lab   PLOF: Independent, Vocation/Vocational requirements: art degree, clinical lab work , Leisure: cats, and cared for her mother    PATIENT GOALS: I want to be able to move better and feel better   NEXT MD VISIT: unknown   OBJECTIVE:  Note: Objective measures were completed at Evaluation unless otherwise noted.  XR:  DIAGNOSTIC FINDINGS:  FINDINGS: No fracture or dislocation. No spondylolisthesis. Mild thoracic kyphosis. Vertebral endplate spurring at multiple levels in the mid and lower thoracic spine.   IMPRESSION: No acute findings. Multilevel degenerative changes as above.  PATIENT SURVEYS:  Fatigue Assessment Scale given on eval   COGNITION: Overall cognitive status: Within functional limits for tasks assessed     SENSATION: WFL  EDEMA: None    MUSCLE LENGTH: Hamstrings: tight  Thomas test: tight   POSTURE: rounded shoulders, forward head, and increased thoracic  kyphosis  PALPATION: Pain with palpation to Rt lateral and anterior hip and thigh, It feels bruised  LOWER EXTREMITY ROM:  Active ROM Right eval Left eval  Hip flexion Min pain WNL  WNL   Hip extension    Hip abduction    Hip adduction    Hip internal rotation Pain, WNL Pain WNL  Hip external rotation Pain, WNL  Pain WNL   Knee flexion WNL WNL   Knee extension WNL WNL   Ankle dorsiflexion    Ankle plantarflexion    Ankle inversion    Ankle eversion     (Blank rows = not tested)  Trunk motion: Flexion NT due bone density Extension WFL Rotation 25% each direction   LOWER EXTREMITY MMT:  MMT Right eval Left eval  Hip flexion 4 4  Hip extension    Hip abduction 3- 3-  Hip adduction    Hip internal rotation    Hip external rotation    Knee flexion 4 4  Knee extension 4 4  Ankle dorsiflexion    Ankle plantarflexion    Ankle inversion    Ankle eversion     (Blank rows = not tested)  LOWER EXTREMITY SPECIAL TESTS:  Hip special tests: Belvie (FABER) test: positive  and Hip scouring test: positive   FUNCTIONAL TESTS:  5 times sit to stand: 16 sec  2 minute walk test: 473 feet, pain 1/10   GAIT: Distance walked: 473 Assistive device utilized: None Level of assistance: Complete Independence Comments: min increase in hip pain                                                                                                                                 TREATMENT DATE:   [facility] Adult PT Treatment:                                                DATE: 11/30/23  Therapeutic Activity: NuStep L4 UE and LE for 6 min  Hamstring stretch- x 2 strap, arms tired  Bridging x 10 x 2 , cues to slow  LTR x 5  Hip abduction x 10 each side cues  Sit to stand  x 10 no UEs  SLR cramping mod to SAQ x 10 Red band on  thighs double leg x 10 x 2  Single leg x 10 each side , band painful on leg Seated horizontal abd red band x 2 x10 mod cues   11/23/23  Pain with supine ,  hooklying Hamstring stretch Bridging LTR  Pt ed and self care, POC 2 min walk test    PATIENT EDUCATION:  Education details: Fatigue, PT, POC  strengthening and walking for bone density , aquatics  Person educated: Patient Education method: Explanation, Demonstration, and Handouts Education comprehension: verbalized understanding and needs further education  HOME EXERCISE PROGRAM: Access Code: [email or web address] URL: [email or web address] Date: 11/23/2023 Prepared by: Delon Norma  Exercises - Hooklying Single Knee to Chest Stretch  - 1 x daily - 7 x weekly - 1 sets - 5 reps - 30 hold - Supine Bridge  - 1 x daily - 7 x weekly - 2 sets - 10 reps - 5 hold - Supine Lower Trunk Rotation  - 1 x daily - 7 x weekly - 2 sets - 10 reps - 10 hold - Standing Hip Abduction with Anterior Support  - 1 x daily - 7 x weekly - 2 sets - 10 reps - Standing Marching  - 1 x daily - 7 x weekly - 2 sets - 10 reps  ASSESSMENT:  CLINICAL IMPRESSION: Patient needs cues to stay on task.  She was able to complete her home program with mod cues. Fatigue most limits her ability to be active in her community and in her home. She scored 36/60 on the FAS, goal set.  She felt good but also extreme exhaustion.  Added upper back exercise to HEP.  She will benefit from skilled PT to improve overall stamina and reduce general joint and muscle aches.    OBJECTIVE IMPAIRMENTS: decreased activity tolerance, decreased endurance, decreased mobility, difficulty walking, decreased ROM, decreased strength, increased fascial restrictions, increased muscle spasms, impaired flexibility, impaired UE functional use, postural dysfunction, and pain.   ACTIVITY LIMITATIONS: carrying, lifting, bending, squatting, transfers, bed mobility, and locomotion level  PARTICIPATION LIMITATIONS: meal prep, cleaning, laundry, interpersonal relationship, shopping, and community activity  PERSONAL FACTORS: Behavior pattern, Time since  onset of injury/illness/exacerbation, and 1-2 comorbidities: osteopenia, fatigue   are also affecting patient's functional outcome.   REHAB POTENTIAL: Excellent  CLINICAL DECISION MAKING: Stable/uncomplicated  EVALUATION COMPLEXITY: [place]   GOALS: Goals reviewed with patient? Yes  LONG TERM GOALS: Target date: 01/04/2024  Pt will be I with HEP for posture and general mobility  Baseline: unknown, given basic LE on eval  Goal status:ongoing   2.  Pt will be able to report hip pain resolved when walking in her home  Baseline: 1/10-2/10 Goal status: INITIAL  3.  Patient will be able to begin walking (weather permitting) 20 min x 2-3 per week  Baseline: has not done this due to cold  Goal status: INITIAL  4.  Pt will be able to tolerate 30 min of standing, light home tasks before needing to sit or lie down  Baseline: 15 min  Goal status: INITIAL  5.  Fatigue Score will improve to 30/36 to show an impact on mobility  Baseline:  (scored 36/50) Goal status: INITIAL  PLAN:  PT FREQUENCY: 1-2x/week  PT DURATION: 6 weeks  PLANNED INTERVENTIONS: 97164- PT Re-evaluation, 97110-Therapeutic exercises, 97530- Therapeutic activity, 97535- Self Care, 02859- Manual therapy, [id number]- Aquatic Therapy, Patient/Family education, Cryotherapy, and Moist heat  PLAN FOR NEXT SESSION: NuStep, check HEP, posture- upper back.  Pt may be interested in Aquatics but  does have some reservations.    Makynzie Dobesh, PT 11/30/2023, 11:49 AM   Delon Norma, PT 11/30/23 11:49 AM Phone: [phone] Fax: [phone]   Date of referral: 11/01/23 Referring provider: Lovie Clarity, MD Referring diagnosis? M79.7 (ICD-10-CM) - Fibromyalgia Treatment diagnosis? (if different than referring diagnosis) cramp and spasm, polyarthralgia   What was this (referring dx) caused by? Ongoing Issue  [place] of Condition: Chronic (continuous duration > 3 months)   Laterality: Both  Current Functional Measure Score: Other  Pain  Objective measurements identify impairments when they are compared to normal values, the uninvolved extremity, and prior level of function.  [x]  Yes  []  No  Objective assessment of functional ability: Moderate functional limitations   Briefly describe symptoms: fatigue, excessive when considering activity , cramping of muscles, R hip pain, abnormal posture   How did symptoms start: gradual onset for 20 + years, progressive fatigue   Average pain intensity:  Last 24 hours: 2/10-3/10   Past week: 2/10-3/10   How often does the pt experience symptoms? Frequently  How much have the symptoms interfered with usual daily activities? Moderately  How has condition changed since care began at this facility? NA - initial visit  In general, how is the patients overall health? Very Good   BACK PAIN (STarT Back Screening Tool) No   Delon Norma, PT 11/30/23 11:49 AM Phone: [phone] Fax: [phone]

## 2023-12-07 ENCOUNTER — Ambulatory Visit: Payer: Medicare Other

## 2023-12-07 DIAGNOSIS — M797 Fibromyalgia: Secondary | ICD-10-CM

## 2023-12-07 NOTE — Therapy (Signed)
OUTPATIENT PHYSICAL THERAPY NOTE   Patient Name: Jacqueline Wyatt MRN: [id number] DOB:1946-04-19, 78 y.o., female Today's Date: 12/07/2023  END OF SESSION:  PT End of Session - 12/07/23 1454     Visit Number 3    Number of Visits 8    Date for PT Re-Evaluation 01/18/24    Authorization Type [employer] MCR    PT Start Time 1450    PT Stop Time 1528    PT Time Calculation (min) 38 min    Activity Tolerance Patient tolerated treatment well    Behavior During Therapy WFL for tasks assessed/performed;Anxious               Past Medical History:  Diagnosis Date   Allergy    Arthritis    Asthma    Chest pain of uncertain etiology 03/25/2021   Chronic pain syndrome 08/05/2017   Essential hypertension 05/27/2021   Fibromyalgia    Fibromyalgia muscle pain    Neuromuscular disorder (HCC)    Osteoporosis    Right foot pain 07/29/2022   Patient has localized pain to medial aspect of right heel.  There is no visible swelling, redness or deformity.  After the physical examination I do not anticipate a fracture or acute bone pathology.  I will prescribe topical Voltaren gel for symptomatic management.  We will reevaluate the patient on the follow-up visit.   Thyroid disease    Past Surgical History:  Procedure Laterality Date   LEFT HEART CATH AND CORONARY ANGIOGRAPHY N/A 03/24/2021   Procedure: LEFT HEART CATH AND CORONARY ANGIOGRAPHY;  Surgeon: Corky Crafts, MD;  Location: [facility] INVASIVE CV LAB;  Service: Cardiovascular;  Laterality: N/A;   Patient Active Problem List   Diagnosis Date Noted   Chronic fatigue 03/09/2023   Hearing impairment 09/09/2022   Dyslipidemia 11/07/2019   Vitamin D deficiency 08/05/2017   Hypothyroidism 11/24/2011   Environmental allergies 11/24/2011   Fibromyalgia 11/24/2011   Irritable bowel syndrome (IBS) 11/24/2011   Osteoporosis 11/24/2011    PCP: Rana Snare DO   REFERRING PROVIDER: Mercie Eon, MD  REFERRING DIAG: M79.7 (ICD-10-CM) -  Fibromyalgia  THERAPY DIAG:  Fibromyalgia  Rationale for Evaluation and Treatment: Rehabilitation  ONSET DATE: 20 years   SUBJECTIVE:   SUBJECTIVE STATEMENT: Patient reports that she has done her HEP a few times since last session.   PERTINENT HISTORY: H/o migraines  Fatigue  Osteopenia Fibromyalgia   PAIN:  Are you having pain? Yes: NPRS scale: not rated. Its a [place] level all the time.   Pain location: upper back, general   Pain description: aching  Aggravating factors: walking  Relieving factors: rest   PRECAUTIONS: Other: osteopenia  RED FLAGS: None   WEIGHT BEARING RESTRICTIONS: No  FALLS:  Has patient fallen in last 6 months?  No just 1 near fall   LIVING ENVIRONMENT: Lives with: lives alone Lives in: House/apartment Stairs: Yes: Internal: 6 STE, 9 down the stairs split level  steps; on right going up Has following equipment at home: Single point cane and Grab bars  OCCUPATION: retired in clinical lab   PLOF: Independent, Vocation/Vocational requirements: art degree, clinical lab work , Leisure: cats, and cared for her mother    PATIENT GOALS: I want to be able to move better and feel better   NEXT MD VISIT: unknown   OBJECTIVE:  Note: Objective measures were completed at Evaluation unless otherwise noted.  XR:  DIAGNOSTIC FINDINGS:  FINDINGS: No fracture or dislocation. No spondylolisthesis. Mild thoracic kyphosis. Vertebral  endplate spurring at multiple levels in the mid and lower thoracic spine.   IMPRESSION: No acute findings. Multilevel degenerative changes as above.  PATIENT SURVEYS:  Fatigue Assessment Scale given on eval   COGNITION: Overall cognitive status: Within functional limits for tasks assessed     SENSATION: WFL  EDEMA: None    MUSCLE LENGTH: Hamstrings: tight  Thomas test: tight   POSTURE: rounded shoulders, forward head, and increased thoracic kyphosis  PALPATION: Pain with palpation to Rt lateral and anterior  hip and thigh, It feels "bruised"  LOWER EXTREMITY ROM:  Active ROM Right eval Left eval  Hip flexion Min pain WNL  WNL   Hip extension    Hip abduction    Hip adduction    Hip internal rotation Pain, WNL Pain WNL  Hip external rotation Pain, WNL  Pain WNL   Knee flexion WNL WNL   Knee extension WNL WNL   Ankle dorsiflexion    Ankle plantarflexion    Ankle inversion    Ankle eversion     (Blank rows = not tested)  Trunk motion: Flexion NT due bone density Extension WFL Rotation 25% each direction   LOWER EXTREMITY MMT:  MMT Right eval Left eval  Hip flexion 4 4  Hip extension    Hip abduction 3- 3-  Hip adduction    Hip internal rotation    Hip external rotation    Knee flexion 4 4  Knee extension 4 4  Ankle dorsiflexion    Ankle plantarflexion    Ankle inversion    Ankle eversion     (Blank rows = not tested)  LOWER EXTREMITY SPECIAL TESTS:  Hip special tests: Luisa Hart (FABER) test: positive  and Hip scouring test: positive   FUNCTIONAL TESTS:  5 times sit to stand: 16 sec  2 minute walk test: 473 feet, pain 1/10   GAIT: Distance walked: 473 Assistive device utilized: None Level of assistance: Complete Independence Comments: min increase in hip pain                                                                                                                                 TREATMENT DATE:  [facility] Adult PT Treatment:                                                DATE: 12/07/23 Therapeutic Exercise: NuStep L5 UE and LE for 6 min  Seated hamstring stretch 2x30" BIL Bridging x 15 (cramps in BIL hamstrings) SAQ over bolster 2x10 BIL SKTC x30" BIL DKTC x30" Supine hip adduction ball squeeze 5" hold x10 Sit to stand x 10 no UEs    [facility] Adult PT Treatment:  DATE: 11/30/23  Therapeutic Activity: NuStep L4 UE and LE for 6 min  Hamstring stretch- x 2 strap, arms tired  Bridging x 10 x 2 , cues to slow  LTR  x 5  Hip abduction x 10 each side cues  Sit to stand  x 10 no UEs  SLR cramping mod to SAQ x 10 Red band on thighs double leg x 10 x 2  Single leg x 10 each side , band painful on leg Seated horizontal abd red band x 2 x10 mod cues   11/23/23  Pain with supine , hooklying Hamstring stretch Bridging LTR  Pt ed and self care, POC 2 min walk test    PATIENT EDUCATION:  Education details: Fatigue, PT, POC  strengthening and walking for bone density , aquatics  Person educated: Patient Education method: Explanation, Demonstration, and Handouts Education comprehension: verbalized understanding and needs further education  HOME EXERCISE PROGRAM: Access Code: [email or web address] URL: [email or web address] Date: 11/23/2023 Prepared by: Karie Mainland  Exercises - Hooklying Single Knee to Chest Stretch  - 1 x daily - 7 x weekly - 1 sets - 5 reps - 30 hold - Supine Bridge  - 1 x daily - 7 x weekly - 2 sets - 10 reps - 5 hold - Supine Lower Trunk Rotation  - 1 x daily - 7 x weekly - 2 sets - 10 reps - 10 hold - Standing Hip Abduction with Anterior Support  - 1 x daily - 7 x weekly - 2 sets - 10 reps - Standing Marching  - 1 x daily - 7 x weekly - 2 sets - 10 reps  ASSESSMENT:  CLINICAL IMPRESSION: Patient presents to PT reporting continued pain in hip/groin area and that she has done her HEP a few times since last session. Session today focused on gentle strengthening and mobility within her pain tolerance. She is limited by fatigue, occasional muscle cramps, and needing cues to remain on task throughout session. Patient continues to benefit from skilled PT services and should be progressed as able to improve functional independence.    OBJECTIVE IMPAIRMENTS: decreased activity tolerance, decreased endurance, decreased mobility, difficulty walking, decreased ROM, decreased strength, increased fascial restrictions, increased muscle spasms, impaired flexibility, impaired UE functional  use, postural dysfunction, and pain.   ACTIVITY LIMITATIONS: carrying, lifting, bending, squatting, transfers, bed mobility, and locomotion level  PARTICIPATION LIMITATIONS: meal prep, cleaning, laundry, interpersonal relationship, shopping, and community activity  PERSONAL FACTORS: Behavior pattern, Time since onset of injury/illness/exacerbation, and 1-2 comorbidities: osteopenia, fatigue   are also affecting patient's functional outcome.   REHAB POTENTIAL: Excellent  CLINICAL DECISION MAKING: Stable/uncomplicated  EVALUATION COMPLEXITY: [place]   GOALS: Goals reviewed with patient? Yes  LONG TERM GOALS: Target date: 01/04/2024  Pt will be I with HEP for posture and general mobility  Baseline: unknown, given basic LE on eval  Goal status:ongoing   2.  Pt will be able to report hip pain resolved when walking in her home  Baseline: 1/10-2/10 Goal status: INITIAL  3.  Patient will be able to begin walking (weather permitting) 20 min x 2-3 per week  Baseline: has not done this due to cold  Goal status: INITIAL  4.  Pt will be able to tolerate 30 min of standing, light home tasks before needing to sit or lie down  Baseline: 15 min  Goal status: INITIAL  5.  Fatigue Score will improve to 30/36 to show an impact on mobility  Baseline:  (scored 36/50) Goal  status: INITIAL  PLAN:  PT FREQUENCY: 1-2x/week  PT DURATION: 6 weeks  PLANNED INTERVENTIONS: 97164- PT Re-evaluation, 97110-Therapeutic exercises, 97530- Therapeutic activity, 97535- Self Care, 16109- Manual therapy, [id number]- Aquatic Therapy, Patient/Family education, Cryotherapy, and Moist heat  PLAN FOR NEXT SESSION: NuStep, check HEP, posture- upper back.  Pt may be interested in Aquatics but does have some reservations.    Berta Minor PTA 12/07/23 3:30 PM Phone: [phone] Fax: [phone]

## 2023-12-14 ENCOUNTER — Ambulatory Visit: Payer: Medicare Other

## 2023-12-21 ENCOUNTER — Ambulatory Visit: Payer: Medicare Other

## 2023-12-21 ENCOUNTER — Ambulatory Visit: Payer: Medicare Other | Admitting: Physical Therapy

## 2023-12-21 DIAGNOSIS — M797 Fibromyalgia: Secondary | ICD-10-CM | POA: Diagnosis not present

## 2023-12-21 NOTE — Therapy (Deleted)
 OUTPATIENT PHYSICAL THERAPY NOTE   Patient Name: Jacqueline Wyatt MRN: [id number] DOB:16-May-1946, 78 y.o., female Today's Date: 12/21/2023  END OF SESSION:      Past Medical History:  Diagnosis Date   Allergy    Arthritis    Asthma    Chest pain of uncertain etiology 03/25/2021   Chronic pain syndrome 08/05/2017   Essential hypertension 05/27/2021   Fibromyalgia    Fibromyalgia muscle pain    Neuromuscular disorder (HCC)    Osteoporosis    Right foot pain 07/29/2022   Patient has localized pain to medial aspect of right heel.  There is no visible swelling, redness or deformity.  After the physical examination I do not anticipate a fracture or acute bone pathology.  I will prescribe topical Voltaren gel for symptomatic management.  We will reevaluate the patient on the follow-up visit.   Thyroid disease    Past Surgical History:  Procedure Laterality Date   LEFT HEART CATH AND CORONARY ANGIOGRAPHY N/A 03/24/2021   Procedure: LEFT HEART CATH AND CORONARY ANGIOGRAPHY;  Surgeon: Corky Crafts, MD;  Location[facility] INVASIVE CV LAB;  Service: Cardiovascular;  Laterality: N/A;   Patient Active Problem List   Diagnosis Date Noted   Chronic fatigue 03/09/2023   Hearing impairment 09/09/2022   Dyslipidemia 11/07/2019   Vitamin D deficiency 08/05/2017   Hypothyroidism 11/24/2011   Environmental allergies 11/24/2011   Fibromyalgia 11/24/2011   Irritable bowel syndrome (IBS) 11/24/2011   Osteoporosis 11/24/2011    PCP: Rana Snare DO   REFERRING PROVIDER: Mercie Eon, MD  REFERRING DIAG: M79.7 (ICD-10-CM) - Fibromyalgia  THERAPY DIAG:  No diagnosis found.  Rationale for Evaluation and Treatment: Rehabilitation  ONSET DATE: 20 years   SUBJECTIVE:   SUBJECTIVE STATEMENT: Patient reports that she has done her HEP a few times since last session.   PERTINENT HISTORY: H/o migraines  Fatigue  Osteopenia Fibromyalgia   PAIN:  Are you having pain? Yes: NPRS scale:  not rated. Its a [place] level all the time.   Pain location: upper back, general   Pain description: aching  Aggravating factors: walking  Relieving factors: rest   PRECAUTIONS: Other: osteopenia  RED FLAGS: None   WEIGHT BEARING RESTRICTIONS: No  FALLS:  Has patient fallen in last 6 months?  No just 1 near fall   LIVING ENVIRONMENT: Lives with: lives alone Lives in: House/apartment Stairs: Yes: Internal: 6 STE, 9 down the stairs split level  steps; on right going up Has following equipment at home: Single point cane and Grab bars  OCCUPATION: retired in clinical lab   PLOF: Independent, Vocation/Vocational requirements: art degree, clinical lab work , Leisure: cats, and cared for her mother    PATIENT GOALS: I want to be able to move better and feel better   NEXT MD VISIT: unknown   OBJECTIVE:  Note: Objective measures were completed at Evaluation unless otherwise noted.  XR:  DIAGNOSTIC FINDINGS:  FINDINGS: No fracture or dislocation. No spondylolisthesis. Mild thoracic kyphosis. Vertebral endplate spurring at multiple levels in the mid and lower thoracic spine.   IMPRESSION: No acute findings. Multilevel degenerative changes as above.  PATIENT SURVEYS:  Fatigue Assessment Scale given on eval   COGNITION: Overall cognitive status: Within functional limits for tasks assessed     SENSATION: WFL  EDEMA: None    MUSCLE LENGTH: Hamstrings: tight  Thomas test: tight   POSTURE: rounded shoulders, forward head, and increased thoracic kyphosis  PALPATION: Pain with palpation to Rt lateral and anterior  hip and thigh, It feels "bruised"  LOWER EXTREMITY ROM:  Active ROM Right eval Left eval  Hip flexion Min pain WNL  WNL   Hip extension    Hip abduction    Hip adduction    Hip internal rotation Pain, WNL Pain WNL  Hip external rotation Pain, WNL  Pain WNL   Knee flexion WNL WNL   Knee extension WNL WNL   Ankle dorsiflexion    Ankle plantarflexion     Ankle inversion    Ankle eversion     (Blank rows = not tested)  Trunk motion: Flexion NT due bone density Extension WFL Rotation 25% each direction   LOWER EXTREMITY MMT:  MMT Right eval Left eval  Hip flexion 4 4  Hip extension    Hip abduction 3- 3-  Hip adduction    Hip internal rotation    Hip external rotation    Knee flexion 4 4  Knee extension 4 4  Ankle dorsiflexion    Ankle plantarflexion    Ankle inversion    Ankle eversion     (Blank rows = not tested)  LOWER EXTREMITY SPECIAL TESTS:  Hip special tests: Luisa Hart (FABER) test: positive  and Hip scouring test: positive   FUNCTIONAL TESTS:  5 times sit to stand: 16 sec  2 minute walk test: 473 feet, pain 1/10   GAIT: Distance walked: 473 Assistive device utilized: None Level of assistance: Complete Independence Comments: min increase in hip pain                                                                                                                                 TREATMENT DATE:  [facility] Adult PT Treatment:                                                DATE: 12/07/23 Therapeutic Exercise: NuStep L5 UE and LE for 6 min  Seated hamstring stretch 2x30" BIL Bridging x 15 (cramps in BIL hamstrings) SAQ over bolster 2x10 BIL SKTC x30" BIL DKTC x30" Supine hip adduction ball squeeze 5" hold x10 Sit to stand x 10 no UEs    [facility] Adult PT Treatment:                                                DATE: 11/30/23  Therapeutic Activity: NuStep L4 UE and LE for 6 min  Hamstring stretch- x 2 strap, arms tired  Bridging x 10 x 2 , cues to slow  LTR x 5  Hip abduction x 10 each side cues  Sit to stand  x 10 no UEs  SLR cramping mod to SAQ x 10  Red band on thighs double leg x 10 x 2  Single leg x 10 each side , band painful on leg Seated horizontal abd red band x 2 x10 mod cues   11/23/23  Pain with supine , hooklying Hamstring stretch Bridging LTR  Pt ed and self care, POC 2 min walk test     PATIENT EDUCATION:  Education details: Fatigue, PT, POC  strengthening and walking for bone density , aquatics  Person educated: Patient Education method: Explanation, Demonstration, and Handouts Education comprehension: verbalized understanding and needs further education  HOME EXERCISE PROGRAM: Access Code: [email or web address] URL: [email or web address] Date: 11/23/2023 Prepared by: Karie Mainland  Exercises - Hooklying Single Knee to Chest Stretch  - 1 x daily - 7 x weekly - 1 sets - 5 reps - 30 hold - Supine Bridge  - 1 x daily - 7 x weekly - 2 sets - 10 reps - 5 hold - Supine Lower Trunk Rotation  - 1 x daily - 7 x weekly - 2 sets - 10 reps - 10 hold - Standing Hip Abduction with Anterior Support  - 1 x daily - 7 x weekly - 2 sets - 10 reps - Standing Marching  - 1 x daily - 7 x weekly - 2 sets - 10 reps  ASSESSMENT:  CLINICAL IMPRESSION: Patient presents to PT reporting continued pain in hip/groin area and that she has done her HEP a few times since last session. Session today focused on gentle strengthening and mobility within her pain tolerance. She is limited by fatigue, occasional muscle cramps, and needing cues to remain on task throughout session. Patient continues to benefit from skilled PT services and should be progressed as able to improve functional independence.    OBJECTIVE IMPAIRMENTS: decreased activity tolerance, decreased endurance, decreased mobility, difficulty walking, decreased ROM, decreased strength, increased fascial restrictions, increased muscle spasms, impaired flexibility, impaired UE functional use, postural dysfunction, and pain.   ACTIVITY LIMITATIONS: carrying, lifting, bending, squatting, transfers, bed mobility, and locomotion level  PARTICIPATION LIMITATIONS: meal prep, cleaning, laundry, interpersonal relationship, shopping, and community activity  PERSONAL FACTORS: Behavior pattern, Time since onset of injury/illness/exacerbation, and  1-2 comorbidities: osteopenia, fatigue   are also affecting patient's functional outcome.   REHAB POTENTIAL: Excellent  CLINICAL DECISION MAKING: Stable/uncomplicated  EVALUATION COMPLEXITY: [place]   GOALS: Goals reviewed with patient? Yes  LONG TERM GOALS: Target date: 01/04/2024  Pt will be I with HEP for posture and general mobility  Baseline: unknown, given basic LE on eval  Goal status:ongoing   2.  Pt will be able to report hip pain resolved when walking in her home  Baseline: 1/10-2/10 Goal status: INITIAL  3.  Patient will be able to begin walking (weather permitting) 20 min x 2-3 per week  Baseline: has not done this due to cold  Goal status: INITIAL  4.  Pt will be able to tolerate 30 min of standing, light home tasks before needing to sit or lie down  Baseline: 15 min  Goal status: INITIAL  5.  Fatigue Score will improve to 30/36 to show an impact on mobility  Baseline:  (scored 36/50) Goal status: INITIAL  PLAN:  PT FREQUENCY: 1-2x/week  PT DURATION: 6 weeks  PLANNED INTERVENTIONS: 97164- PT Re-evaluation, 97110-Therapeutic exercises, 97530- Therapeutic activity, 97535- Self Care, 54098- Manual therapy, [id number]- Aquatic Therapy, Patient/Family education, Cryotherapy, and Moist heat  PLAN FOR NEXT SESSION: NuStep, check HEP, posture- upper back.  Pt may be interested in Aquatics but does have  some reservations.    Lanell Persons PT 12/21/23 12:09 PM Phone: [phone] Fax: [phone]

## 2023-12-21 NOTE — Therapy (Signed)
 OUTPATIENT PHYSICAL THERAPY NOTE   Patient Name: Jacqueline Wyatt MRN: [id number] DOB:02/08/1946, 78 y.o., female Today's Date: 12/21/2023  END OF SESSION:  PT End of Session - 12/21/23 1444     Visit Number 4    Number of Visits 8    Date for PT Re-Evaluation 01/18/24    Authorization Type [employer] MCR    PT Start Time 1445    PT Stop Time 1525    PT Time Calculation (min) 40 min    Activity Tolerance Patient tolerated treatment well    Behavior During Therapy WFL for tasks assessed/performed;Anxious                Past Medical History:  Diagnosis Date   Allergy    Arthritis    Asthma    Chest pain of uncertain etiology 03/25/2021   Chronic pain syndrome 08/05/2017   Essential hypertension 05/27/2021   Fibromyalgia    Fibromyalgia muscle pain    Neuromuscular disorder (HCC)    Osteoporosis    Right foot pain 07/29/2022   Patient has localized pain to medial aspect of right heel.  There is no visible swelling, redness or deformity.  After the physical examination I do not anticipate a fracture or acute bone pathology.  I will prescribe topical Voltaren gel for symptomatic management.  We will reevaluate the patient on the follow-up visit.   Thyroid disease    Past Surgical History:  Procedure Laterality Date   LEFT HEART CATH AND CORONARY ANGIOGRAPHY N/A 03/24/2021   Procedure: LEFT HEART CATH AND CORONARY ANGIOGRAPHY;  Surgeon: Corky Crafts, MD;  Location: [facility] INVASIVE CV LAB;  Service: Cardiovascular;  Laterality: N/A;   Patient Active Problem List   Diagnosis Date Noted   Chronic fatigue 03/09/2023   Hearing impairment 09/09/2022   Dyslipidemia 11/07/2019   Vitamin D deficiency 08/05/2017   Hypothyroidism 11/24/2011   Environmental allergies 11/24/2011   Fibromyalgia 11/24/2011   Irritable bowel syndrome (IBS) 11/24/2011   Osteoporosis 11/24/2011    PCP: Rana Snare DO   REFERRING PROVIDER: Mercie Eon, MD  REFERRING DIAG: M79.7 (ICD-10-CM) -  Fibromyalgia  THERAPY DIAG:  Fibromyalgia  Rationale for Evaluation and Treatment: Rehabilitation  ONSET DATE: 20 years   SUBJECTIVE:   SUBJECTIVE STATEMENT: Pt presents to PT with reports of continued symptoms of fatigue, 3/10 on RPE. Has been compliant with HEP.  PERTINENT HISTORY: H/o migraines  Fatigue  Osteopenia Fibromyalgia   PAIN:  Are you having pain?  Yes: NPRS scale: not rated. Its a [place] level all the time.   Pain location: upper back, general   Pain description: aching  Aggravating factors: walking  Relieving factors: rest   PRECAUTIONS: Other: osteopenia  RED FLAGS: None   WEIGHT BEARING RESTRICTIONS: No  FALLS:  Has patient fallen in last 6 months?  No just 1 near fall   LIVING ENVIRONMENT: Lives with: lives alone Lives in: House/apartment Stairs: Yes: Internal: 6 STE, 9 down the stairs split level  steps; on right going up Has following equipment at home: Single point cane and Grab bars  OCCUPATION: retired in clinical lab   PLOF: Independent, Vocation/Vocational requirements: art degree, clinical lab work , Leisure: cats, and cared for her mother    PATIENT GOALS: I want to be able to move better and feel better   NEXT MD VISIT: unknown   OBJECTIVE:  Note: Objective measures were completed at Evaluation unless otherwise noted.  XR:  DIAGNOSTIC FINDINGS:  FINDINGS: No fracture or dislocation.  No spondylolisthesis. Mild thoracic kyphosis. Vertebral endplate spurring at multiple levels in the mid and lower thoracic spine.   IMPRESSION: No acute findings. Multilevel degenerative changes as above.  PATIENT SURVEYS:  Fatigue Assessment Scale given on eval   COGNITION: Overall cognitive status: Within functional limits for tasks assessed     SENSATION: WFL  EDEMA: None    MUSCLE LENGTH: Hamstrings: tight  Thomas test: tight   POSTURE: rounded shoulders, forward head, and increased thoracic kyphosis  PALPATION: Pain with  palpation to Rt lateral and anterior hip and thigh, It feels "bruised"  LOWER EXTREMITY ROM:  Active ROM Right eval Left eval  Hip flexion Min pain WNL  WNL   Hip extension    Hip abduction    Hip adduction    Hip internal rotation Pain, WNL Pain WNL  Hip external rotation Pain, WNL  Pain WNL   Knee flexion WNL WNL   Knee extension WNL WNL   Ankle dorsiflexion    Ankle plantarflexion    Ankle inversion    Ankle eversion     (Blank rows = not tested)  Trunk motion: Flexion NT due bone density Extension WFL Rotation 25% each direction   LOWER EXTREMITY MMT:  MMT Right eval Left eval  Hip flexion 4 4  Hip extension    Hip abduction 3- 3-  Hip adduction    Hip internal rotation    Hip external rotation    Knee flexion 4 4  Knee extension 4 4  Ankle dorsiflexion    Ankle plantarflexion    Ankle inversion    Ankle eversion     (Blank rows = not tested)  LOWER EXTREMITY SPECIAL TESTS:  Hip special tests: Luisa Hart (FABER) test: positive  and Hip scouring test: positive   FUNCTIONAL TESTS:  5 times sit to stand: 16 sec  2 minute walk test: 473 feet, pain 1/10   GAIT: Distance walked: 473 Assistive device utilized: None Level of assistance: Complete Independence Comments: min increase in hip pain                                                                                                                                 TREATMENT DATE:  [facility] Adult PT Treatment:                                                DATE: 12/21/23 Therapeutic Exercise: NuStep L5 UE and LE for 5 min while taking subjective SLR x 10 each - frequent rest breaks due to fatigue and rest SAQ 2x10 Supine horizontal abd 2x15 YTB Supine chest press 2x10 2# Hooklying clamshell 2x15 RTB Hooklying bent knee fallout 2x10 RTB Bridge x 10 - cramping so stopped exercise Seated hamstring stretch x 30" BIL STS 2x10 - no UE high table FM  row 2x10 17#  [facility] Adult PT Treatment:                                                 DATE: 12/07/23 Therapeutic Exercise: NuStep L5 UE and LE for 6 min  Seated hamstring stretch 2x30" BIL Bridging x 15 (cramps in BIL hamstrings) SAQ over bolster 2x10 BIL SKTC x30" BIL DKTC x30" Supine hip adduction ball squeeze 5" hold x10 Sit to stand x 10 no UEs    [facility] Adult PT Treatment:                                                DATE: 11/30/23  Therapeutic Activity: NuStep L4 UE and LE for 6 min  Hamstring stretch- x 2 strap, arms tired  Bridging x 10 x 2 , cues to slow  LTR x 5  Hip abduction x 10 each side cues  Sit to stand  x 10 no UEs  SLR cramping mod to SAQ x 10 Red band on thighs double leg x 10 x 2  Single leg x 10 each side , band painful on leg Seated horizontal abd red band x 2 x10 mod cues   PATIENT EDUCATION:  Education details: Fatigue, PT, POC  strengthening and walking for bone density , aquatics  Person educated: Patient Education method: Explanation, Demonstration, and Handouts Education comprehension: verbalized understanding and needs further education  HOME EXERCISE PROGRAM: Access Code: [email or web address] URL: [email or web address] Date: 11/23/2023 Prepared by: Karie Mainland  Exercises - Hooklying Single Knee to Chest Stretch  - 1 x daily - 7 x weekly - 1 sets - 5 reps - 30 hold - Supine Bridge  - 1 x daily - 7 x weekly - 2 sets - 10 reps - 5 hold - Supine Lower Trunk Rotation  - 1 x daily - 7 x weekly - 2 sets - 10 reps - 10 hold - Standing Hip Abduction with Anterior Support  - 1 x daily - 7 x weekly - 2 sets - 10 reps - Standing Marching  - 1 x daily - 7 x weekly - 2 sets - 10 reps  ASSESSMENT:  CLINICAL IMPRESSION: Pt was able to complete prescribed exercises with no adverse effect, did have increased fatigue post session. Exercsies focused on improving core and proximal hip strength as well as periscapular endurance and overall activity tolerance. Continue to be limited by weakness and decrease in overall  functional activity tolerance. She continues to benefit from skilled PT, will continue to be seen per POC.    OBJECTIVE IMPAIRMENTS: decreased activity tolerance, decreased endurance, decreased mobility, difficulty walking, decreased ROM, decreased strength, increased fascial restrictions, increased muscle spasms, impaired flexibility, impaired UE functional use, postural dysfunction, and pain.   ACTIVITY LIMITATIONS: carrying, lifting, bending, squatting, transfers, bed mobility, and locomotion level  PARTICIPATION LIMITATIONS: meal prep, cleaning, laundry, interpersonal relationship, shopping, and community activity  PERSONAL FACTORS: Behavior pattern, Time since onset of injury/illness/exacerbation, and 1-2 comorbidities: osteopenia, fatigue   are also affecting patient's functional outcome.   REHAB POTENTIAL: Excellent  CLINICAL DECISION MAKING: Stable/uncomplicated  EVALUATION COMPLEXITY: [place]   GOALS: Goals reviewed with patient? Yes  LONG TERM GOALS: Target date: 01/04/2024  Pt will be  I with HEP for posture and general mobility  Baseline: unknown, given basic LE on eval  Goal status:ongoing   2.  Pt will be able to report hip pain resolved when walking in her home  Baseline: 1/10-2/10 Goal status: INITIAL  3.  Patient will be able to begin walking (weather permitting) 20 min x 2-3 per week  Baseline: has not done this due to cold  Goal status: INITIAL  4.  Pt will be able to tolerate 30 min of standing, light home tasks before needing to sit or lie down  Baseline: 15 min  Goal status: INITIAL  5.  Fatigue Score will improve to 30/36 to show an impact on mobility  Baseline:  (scored 36/50) Goal status: INITIAL  PLAN:  PT FREQUENCY: 1-2x/week  PT DURATION: 6 weeks  PLANNED INTERVENTIONS: 97164- PT Re-evaluation, 97110-Therapeutic exercises, 97530- Therapeutic activity, 97535- Self Care, 16109- Manual therapy, [id number]- Aquatic Therapy, Patient/Family education,  Cryotherapy, and Moist heat  PLAN FOR NEXT SESSION: NuStep, check HEP, posture- upper back.  Pt may be interested in Aquatics but does have some reservations.    Eloy End PT 12/21/23 3:29 PM Phone: [phone] Fax: [phone]

## 2023-12-28 ENCOUNTER — Ambulatory Visit: Payer: Medicare Other | Attending: Internal Medicine

## 2023-12-28 DIAGNOSIS — M797 Fibromyalgia: Secondary | ICD-10-CM | POA: Insufficient documentation

## 2023-12-28 NOTE — Therapy (Signed)
 OUTPATIENT PHYSICAL THERAPY NOTE   Patient Name: Jacqueline Wyatt MRN: [id number] DOB:1946-07-15, 78 y.o., female Today's Date: 12/28/2023  END OF SESSION:  PT End of Session - 12/28/23 1446     Visit Number 5    Number of Visits 8    Date for PT Re-Evaluation 01/18/24    Authorization Type [employer] MCR    PT Start Time 1445    PT Stop Time 1525    PT Time Calculation (min) 40 min    Activity Tolerance Patient tolerated treatment well    Behavior During Therapy WFL for tasks assessed/performed;Anxious             Past Medical History:  Diagnosis Date   Allergy    Arthritis    Asthma    Chest pain of uncertain etiology 03/25/2021   Chronic pain syndrome 08/05/2017   Essential hypertension 05/27/2021   Fibromyalgia    Fibromyalgia muscle pain    Neuromuscular disorder (HCC)    Osteoporosis    Right foot pain 07/29/2022   Patient has localized pain to medial aspect of right heel.  There is no visible swelling, redness or deformity.  After the physical examination I do not anticipate a fracture or acute bone pathology.  I will prescribe topical Voltaren gel for symptomatic management.  We will reevaluate the patient on the follow-up visit.   Thyroid disease    Past Surgical History:  Procedure Laterality Date   LEFT HEART CATH AND CORONARY ANGIOGRAPHY N/A 03/24/2021   Procedure: LEFT HEART CATH AND CORONARY ANGIOGRAPHY;  Surgeon: Corky Crafts, MD;  Location: [facility] INVASIVE CV LAB;  Service: Cardiovascular;  Laterality: N/A;   Patient Active Problem List   Diagnosis Date Noted   Chronic fatigue 03/09/2023   Hearing impairment 09/09/2022   Dyslipidemia 11/07/2019   Vitamin D deficiency 08/05/2017   Hypothyroidism 11/24/2011   Environmental allergies 11/24/2011   Fibromyalgia 11/24/2011   Irritable bowel syndrome (IBS) 11/24/2011   Osteoporosis 11/24/2011    PCP: Rana Snare DO   REFERRING PROVIDER: Mercie Eon, MD  REFERRING DIAG: M79.7 (ICD-10-CM) -  Fibromyalgia  THERAPY DIAG:  Fibromyalgia  Rationale for Evaluation and Treatment: Rehabilitation  ONSET DATE: 20 years   SUBJECTIVE:   SUBJECTIVE STATEMENT: Patient reports continued fatigue and that her Lt hip is hurting today.  PERTINENT HISTORY: H/o migraines  Fatigue  Osteopenia Fibromyalgia   PAIN:  Are you having pain?  Yes: NPRS scale: not rated. Its a [place] level all the time.   Pain location: upper back, general   Pain description: aching  Aggravating factors: walking  Relieving factors: rest   PRECAUTIONS: Other: osteopenia  RED FLAGS: None   WEIGHT BEARING RESTRICTIONS: No  FALLS:  Has patient fallen in last 6 months?  No just 1 near fall   LIVING ENVIRONMENT: Lives with: lives alone Lives in: House/apartment Stairs: Yes: Internal: 6 STE, 9 down the stairs split level  steps; on right going up Has following equipment at home: Single point cane and Grab bars  OCCUPATION: retired in clinical lab   PLOF: Independent, Vocation/Vocational requirements: art degree, clinical lab work , Leisure: cats, and cared for her mother    PATIENT GOALS: I want to be able to move better and feel better   NEXT MD VISIT: unknown   OBJECTIVE:  Note: Objective measures were completed at Evaluation unless otherwise noted.  XR:  DIAGNOSTIC FINDINGS:  FINDINGS: No fracture or dislocation. No spondylolisthesis. Mild thoracic kyphosis. Vertebral endplate spurring at multiple  levels in the mid and lower thoracic spine.   IMPRESSION: No acute findings. Multilevel degenerative changes as above.  PATIENT SURVEYS:  Fatigue Assessment Scale given on eval   COGNITION: Overall cognitive status: Within functional limits for tasks assessed     SENSATION: WFL  EDEMA: None    MUSCLE LENGTH: Hamstrings: tight  Thomas test: tight   POSTURE: rounded shoulders, forward head, and increased thoracic kyphosis  PALPATION: Pain with palpation to Rt lateral and anterior  hip and thigh, It feels "bruised"  LOWER EXTREMITY ROM:  Active ROM Right eval Left eval  Hip flexion Min pain WNL  WNL   Hip extension    Hip abduction    Hip adduction    Hip internal rotation Pain, WNL Pain WNL  Hip external rotation Pain, WNL  Pain WNL   Knee flexion WNL WNL   Knee extension WNL WNL   Ankle dorsiflexion    Ankle plantarflexion    Ankle inversion    Ankle eversion     (Blank rows = not tested)  Trunk motion: Flexion NT due bone density Extension WFL Rotation 25% each direction   LOWER EXTREMITY MMT:  MMT Right eval Left eval  Hip flexion 4 4  Hip extension    Hip abduction 3- 3-  Hip adduction    Hip internal rotation    Hip external rotation    Knee flexion 4 4  Knee extension 4 4  Ankle dorsiflexion    Ankle plantarflexion    Ankle inversion    Ankle eversion     (Blank rows = not tested)  LOWER EXTREMITY SPECIAL TESTS:  Hip special tests: Luisa Hart (FABER) test: positive  and Hip scouring test: positive   FUNCTIONAL TESTS:  5 times sit to stand: 16 sec  2 minute walk test: 473 feet, pain 1/10   GAIT: Distance walked: 473 Assistive device utilized: None Level of assistance: Complete Independence Comments: min increase in hip pain                                                                                                                                 TREATMENT DATE:  [facility] Adult PT Treatment:                                                DATE: 12/28/23 Therapeutic Exercise: NuStep L5 UE and LE for 6 min while taking subjective FM row 2x10 17# SLR x 10 each - no rest breaks needed today SAQ 2x10 Supine horizontal abd 2x15 YTB Supine chest press 2x10 2# dumbbells  Hooklying clamshell 2x15 GTB Bridge x 10 - cramps today Seated hamstring stretch 2 x 30" BIL STS 2x10 - no UE high table   [facility] Adult PT Treatment:  DATE: 12/21/23 Therapeutic Exercise: NuStep L5 UE and LE for 5 min  while taking subjective SLR x 10 each - frequent rest breaks due to fatigue and rest SAQ 2x10 Supine horizontal abd 2x15 YTB Supine chest press 2x10 2# Hooklying clamshell 2x15 RTB Hooklying bent knee fallout 2x10 RTB Bridge x 10 - cramping so stopped exercise Seated hamstring stretch x 30" BIL STS 2x10 - no UE high table FM row 2x10 17#  [facility] Adult PT Treatment:                                                DATE: 12/07/23 Therapeutic Exercise: NuStep L5 UE and LE for 6 min  Seated hamstring stretch 2x30" BIL Bridging x 15 (cramps in BIL hamstrings) SAQ over bolster 2x10 BIL SKTC x30" BIL DKTC x30" Supine hip adduction ball squeeze 5" hold x10 Sit to stand x 10 no UEs     PATIENT EDUCATION:  Education details: Fatigue, PT, POC  strengthening and walking for bone density , aquatics  Person educated: Patient Education method: Explanation, Demonstration, and Handouts Education comprehension: verbalized understanding and needs further education  HOME EXERCISE PROGRAM: Access Code: [email or web address] URL: [email or web address] Date: 11/23/2023 Prepared by: Karie Mainland  Exercises - Hooklying Single Knee to Chest Stretch  - 1 x daily - 7 x weekly - 1 sets - 5 reps - 30 hold - Supine Bridge  - 1 x daily - 7 x weekly - 2 sets - 10 reps - 5 hold - Supine Lower Trunk Rotation  - 1 x daily - 7 x weekly - 2 sets - 10 reps - 10 hold - Standing Hip Abduction with Anterior Support  - 1 x daily - 7 x weekly - 2 sets - 10 reps - Standing Marching  - 1 x daily - 7 x weekly - 2 sets - 10 reps  ASSESSMENT:  CLINICAL IMPRESSION: Patient presents to PT reporting continued fatigue and that her Lt hip has been hurting. Session today continued to focus on periscapular, core, and proximal hip strengthening. She was able to tolerate exercises today better than previous session, no muscle cramps, and did not need as frequent of rest breaks. Patient was able to tolerate all prescribed exercises  with no adverse effects. Patient continues to benefit from skilled PT services and should be progressed as able to improve functional independence.    OBJECTIVE IMPAIRMENTS: decreased activity tolerance, decreased endurance, decreased mobility, difficulty walking, decreased ROM, decreased strength, increased fascial restrictions, increased muscle spasms, impaired flexibility, impaired UE functional use, postural dysfunction, and pain.   ACTIVITY LIMITATIONS: carrying, lifting, bending, squatting, transfers, bed mobility, and locomotion level  PARTICIPATION LIMITATIONS: meal prep, cleaning, laundry, interpersonal relationship, shopping, and community activity  PERSONAL FACTORS: Behavior pattern, Time since onset of injury/illness/exacerbation, and 1-2 comorbidities: osteopenia, fatigue   are also affecting patient's functional outcome.   REHAB POTENTIAL: Excellent  CLINICAL DECISION MAKING: Stable/uncomplicated  EVALUATION COMPLEXITY: [place]   GOALS: Goals reviewed with patient? Yes  LONG TERM GOALS: Target date: 01/04/2024  Pt will be I with HEP for posture and general mobility  Baseline: unknown, given basic LE on eval  Goal status:ongoing   2.  Pt will be able to report hip pain resolved when walking in her home  Baseline: 1/10-2/10 Goal status: INITIAL  3.  Patient will be able to begin walking (weather  permitting) 20 min x 2-3 per week  Baseline: has not done this due to cold  Goal status: INITIAL  4.  Pt will be able to tolerate 30 min of standing, light home tasks before needing to sit or lie down  Baseline: 15 min  Goal status: INITIAL  5.  Fatigue Score will improve to 30/36 to show an impact on mobility  Baseline:  (scored 36/50) Goal status: INITIAL  PLAN:  PT FREQUENCY: 1-2x/week  PT DURATION: 6 weeks  PLANNED INTERVENTIONS: 97164- PT Re-evaluation, 97110-Therapeutic exercises, 97530- Therapeutic activity, 97535- Self Care, 16109- Manual therapy, [id number]- Aquatic  Therapy, Patient/Family education, Cryotherapy, and Moist heat  PLAN FOR NEXT SESSION: NuStep, check HEP, posture- upper back.  No aquatics at this time.   Berta Minor PTA 12/28/23 3:25 PM Phone: [phone] Fax: [phone]

## 2023-12-29 LAB — LAB REPORT - SCANNED
Albumin, Urine POC: 6.6
Creatinine, POC: 146.2 mg/dL
EGFR: 55
Microalb Creat Ratio: 5

## 2024-01-04 ENCOUNTER — Ambulatory Visit

## 2024-01-04 DIAGNOSIS — M797 Fibromyalgia: Secondary | ICD-10-CM

## 2024-01-04 NOTE — Therapy (Signed)
 OUTPATIENT PHYSICAL THERAPY NOTE   Patient Name: Jacqueline Wyatt MRN: [id number] DOB:09-Jan-1946, 78 y.o., female Today's Date: 01/04/2024  END OF SESSION:  PT End of Session - 01/04/24 1445     Visit Number 6    Number of Visits 8    Date for PT Re-Evaluation 01/18/24    Authorization Type [employer] MCR    PT Start Time 1445    PT Stop Time 1523    PT Time Calculation (min) 38 min    Activity Tolerance Patient tolerated treatment well    Behavior During Therapy WFL for tasks assessed/performed;Anxious             Past Medical History:  Diagnosis Date   Allergy    Arthritis    Asthma    Chest pain of uncertain etiology 03/25/2021   Chronic pain syndrome 08/05/2017   Essential hypertension 05/27/2021   Fibromyalgia    Fibromyalgia muscle pain    Neuromuscular disorder (HCC)    Osteoporosis    Right foot pain 07/29/2022   Patient has localized pain to medial aspect of right heel.  There is no visible swelling, redness or deformity.  After the physical examination I do not anticipate a fracture or acute bone pathology.  I will prescribe topical Voltaren gel for symptomatic management.  We will reevaluate the patient on the follow-up visit.   Thyroid disease    Past Surgical History:  Procedure Laterality Date   LEFT HEART CATH AND CORONARY ANGIOGRAPHY N/A 03/24/2021   Procedure: LEFT HEART CATH AND CORONARY ANGIOGRAPHY;  Surgeon: Corky Crafts, MD;  Location: [facility] INVASIVE CV LAB;  Service: Cardiovascular;  Laterality: N/A;   Patient Active Problem List   Diagnosis Date Noted   Chronic fatigue 03/09/2023   Hearing impairment 09/09/2022   Dyslipidemia 11/07/2019   Vitamin D deficiency 08/05/2017   Hypothyroidism 11/24/2011   Environmental allergies 11/24/2011   Fibromyalgia 11/24/2011   Irritable bowel syndrome (IBS) 11/24/2011   Osteoporosis 11/24/2011    PCP: Rana Snare DO   REFERRING PROVIDER: Mercie Eon, MD  REFERRING DIAG: M79.7 (ICD-10-CM) -  Fibromyalgia  THERAPY DIAG:  Fibromyalgia  Rationale for Evaluation and Treatment: Rehabilitation  ONSET DATE: 20 years   SUBJECTIVE:   SUBJECTIVE STATEMENT: Patient reports that she feels "same old same old" regarding the hip pain, fatigue, and generally not feeling well.   PERTINENT HISTORY: H/o migraines  Fatigue  Osteopenia Fibromyalgia   PAIN:  Are you having pain?  Yes: NPRS scale: not rated. Its a [place] level all the time.   Pain location: upper back, general   Pain description: aching  Aggravating factors: walking  Relieving factors: rest   PRECAUTIONS: Other: osteopenia  RED FLAGS: None   WEIGHT BEARING RESTRICTIONS: No  FALLS:  Has patient fallen in last 6 months?  No just 1 near fall   LIVING ENVIRONMENT: Lives with: lives alone Lives in: House/apartment Stairs: Yes: Internal: 6 STE, 9 down the stairs split level  steps; on right going up Has following equipment at home: Single point cane and Grab bars  OCCUPATION: retired in clinical lab   PLOF: Independent, Vocation/Vocational requirements: art degree, clinical lab work , Leisure: cats, and cared for her mother    PATIENT GOALS: I want to be able to move better and feel better   NEXT MD VISIT: unknown   OBJECTIVE:  Note: Objective measures were completed at Evaluation unless otherwise noted.  XR:  DIAGNOSTIC FINDINGS:  FINDINGS: No fracture or dislocation. No spondylolisthesis.  Mild thoracic kyphosis. Vertebral endplate spurring at multiple levels in the mid and lower thoracic spine.   IMPRESSION: No acute findings. Multilevel degenerative changes as above.  PATIENT SURVEYS:  Fatigue Assessment Scale given on eval   COGNITION: Overall cognitive status: Within functional limits for tasks assessed     SENSATION: WFL  EDEMA: None    MUSCLE LENGTH: Hamstrings: tight  Thomas test: tight   POSTURE: rounded shoulders, forward head, and increased thoracic kyphosis  PALPATION: Pain  with palpation to Rt lateral and anterior hip and thigh, It feels "bruised"  LOWER EXTREMITY ROM:  Active ROM Right eval Left eval  Hip flexion Min pain WNL  WNL   Hip extension    Hip abduction    Hip adduction    Hip internal rotation Pain, WNL Pain WNL  Hip external rotation Pain, WNL  Pain WNL   Knee flexion WNL WNL   Knee extension WNL WNL   Ankle dorsiflexion    Ankle plantarflexion    Ankle inversion    Ankle eversion     (Blank rows = not tested)  Trunk motion: Flexion NT due bone density Extension WFL Rotation 25% each direction   LOWER EXTREMITY MMT:  MMT Right eval Left eval  Hip flexion 4 4  Hip extension    Hip abduction 3- 3-  Hip adduction    Hip internal rotation    Hip external rotation    Knee flexion 4 4  Knee extension 4 4  Ankle dorsiflexion    Ankle plantarflexion    Ankle inversion    Ankle eversion     (Blank rows = not tested)  LOWER EXTREMITY SPECIAL TESTS:  Hip special tests: Luisa Hart (FABER) test: positive  and Hip scouring test: positive   FUNCTIONAL TESTS:  5 times sit to stand: 16 sec  2 minute walk test: 473 feet, pain 1/10   GAIT: Distance walked: 473 Assistive device utilized: None Level of assistance: Complete Independence Comments: min increase in hip pain                                                                                                                                TREATMENT DATE:  [facility] Adult PT Treatment:                                                DATE: 01/04/24 Therapeutic Exercise: NuStep L5 UE and LE for  min while taking subjective FM row 2x10 17# FM shoulder extension 13# 2x10 SLR x 10 each - no rest breaks needed today Supine horizontal abd 2x15 RTB Supine chest press 2x10 2# dumbbells  Supine alternating shoulder flex/ext 2# 2x10 BIL Hooklying clamshell 2x15 GTB Bridge 2 x 10 - no cramps today Seated hamstring stretch 2 x 30" BIL STS 2x10 -  no UE    [facility] Adult PT Treatment:                                                 DATE: 12/28/23 Therapeutic Exercise: NuStep L5 UE and LE for 6 min while taking subjective FM row 2x10 17# SLR x 10 each - no rest breaks needed today SAQ 2x10 Supine horizontal abd 2x15 YTB Supine chest press 2x10 2# dumbbells  Hooklying clamshell 2x15 GTB Bridge x 10 - no cramps today Seated hamstring stretch 2 x 30" BIL STS 2x10 - no UE high table   [facility] Adult PT Treatment:                                                DATE: 12/21/23 Therapeutic Exercise: NuStep L5 UE and LE for 5 min while taking subjective SLR x 10 each - frequent rest breaks due to fatigue and rest SAQ 2x10 Supine horizontal abd 2x15 YTB Supine chest press 2x10 2# Hooklying clamshell 2x15 RTB Hooklying bent knee fallout 2x10 RTB Bridge x 10 - cramping so stopped exercise Seated hamstring stretch x 30" BIL STS 2x10 - no UE high table FM row 2x10 17#   PATIENT EDUCATION:  Education details: Fatigue, PT, POC  strengthening and walking for bone density , aquatics  Person educated: Patient Education method: Explanation, Demonstration, and Handouts Education comprehension: verbalized understanding and needs further education  HOME EXERCISE PROGRAM: Access Code: [email or web address] URL: [email or web address] Date: 11/23/2023 Prepared by: Karie Mainland  Exercises - Hooklying Single Knee to Chest Stretch  - 1 x daily - 7 x weekly - 1 sets - 5 reps - 30 hold - Supine Bridge  - 1 x daily - 7 x weekly - 2 sets - 10 reps - 5 hold - Supine Lower Trunk Rotation  - 1 x daily - 7 x weekly - 2 sets - 10 reps - 10 hold - Standing Hip Abduction with Anterior Support  - 1 x daily - 7 x weekly - 2 sets - 10 reps - Standing Marching  - 1 x daily - 7 x weekly - 2 sets - 10 reps  ASSESSMENT:  CLINICAL IMPRESSION: Patient presents to PT reporting continued Lt hip pain, fatigue, and that she just generally does not feel well. Session today continued to focus on periscapular, core,  and proximal hip strengthening. Patient was able to tolerate all prescribed exercises with no adverse effects. Patient continues to benefit from skilled PT services and should be progressed as able to improve functional independence.    OBJECTIVE IMPAIRMENTS: decreased activity tolerance, decreased endurance, decreased mobility, difficulty walking, decreased ROM, decreased strength, increased fascial restrictions, increased muscle spasms, impaired flexibility, impaired UE functional use, postural dysfunction, and pain.   ACTIVITY LIMITATIONS: carrying, lifting, bending, squatting, transfers, bed mobility, and locomotion level  PARTICIPATION LIMITATIONS: meal prep, cleaning, laundry, interpersonal relationship, shopping, and community activity  PERSONAL FACTORS: Behavior pattern, Time since onset of injury/illness/exacerbation, and 1-2 comorbidities: osteopenia, fatigue   are also affecting patient's functional outcome.   REHAB POTENTIAL: Excellent  CLINICAL DECISION MAKING: Stable/uncomplicated  EVALUATION COMPLEXITY: [place]   GOALS: Goals reviewed with patient? Yes  LONG TERM GOALS: Target date: 01/04/2024  Pt will be  I with HEP for posture and general mobility  Baseline: unknown, given basic LE on eval  Goal status:ongoing   2.  Pt will be able to report hip pain resolved when walking in her home  Baseline: 1/10-2/10 Goal status: INITIAL  3.  Patient will be able to begin walking (weather permitting) 20 min x 2-3 per week  Baseline: has not done this due to cold  Goal status: INITIAL  4.  Pt will be able to tolerate 30 min of standing, light home tasks before needing to sit or lie down  Baseline: 15 min  Goal status: INITIAL  5.  Fatigue Score will improve to 30/36 to show an impact on mobility  Baseline:  (scored 36/50) Goal status: INITIAL  PLAN:  PT FREQUENCY: 1-2x/week  PT DURATION: 6 weeks  PLANNED INTERVENTIONS: 97164- PT Re-evaluation, 97110-Therapeutic  exercises, 97530- Therapeutic activity, 97535- Self Care, 16109- Manual therapy, [id number]- Aquatic Therapy, Patient/Family education, Cryotherapy, and Moist heat  PLAN FOR NEXT SESSION: NuStep, check HEP, posture- upper back.  No aquatics at this time.   Berta Minor PTA 01/04/24 3:24 PM Phone: [phone] Fax: [phone]

## 2024-01-11 ENCOUNTER — Ambulatory Visit

## 2024-01-11 DIAGNOSIS — M797 Fibromyalgia: Secondary | ICD-10-CM | POA: Diagnosis not present

## 2024-01-11 NOTE — Therapy (Addendum)
 OUTPATIENT PHYSICAL THERAPY NOTE   Patient Name: Jacqueline Wyatt MRN: [id number] DOB:[birth year], 78 y.o., female Today's Date: 01/11/2024  END OF SESSION:  PT End of Session - 01/11/24 1400     Visit Number 7    Number of Visits 8    Date for PT Re-Evaluation 01/18/24    Authorization Type [employer] MCR    PT Start Time 1400    PT Stop Time 1438    PT Time Calculation (min) 38 min    Activity Tolerance Patient tolerated treatment well    Behavior During Therapy WFL for tasks assessed/performed;Anxious              Past Medical History:  Diagnosis Date   Allergy    Arthritis    Asthma    Chest pain of uncertain etiology 03/25/2021   Chronic pain syndrome 08/05/2017   Essential hypertension 05/27/2021   Fibromyalgia    Fibromyalgia muscle pain    Neuromuscular disorder (HCC)    Osteoporosis    Right foot pain 07/29/2022   Patient has localized pain to medial aspect of right heel.  There is no visible swelling, redness or deformity.  After the physical examination I do not anticipate a fracture or acute bone pathology.  I will prescribe topical Voltaren gel for symptomatic management.  We will reevaluate the patient on the follow-up visit.   Thyroid disease    Past Surgical History:  Procedure Laterality Date   LEFT HEART CATH AND CORONARY ANGIOGRAPHY N/A 03/24/2021   Procedure: LEFT HEART CATH AND CORONARY ANGIOGRAPHY;  Surgeon: Corky Crafts, MD;  Location: [facility] INVASIVE CV LAB;  Service: Cardiovascular;  Laterality: N/A;   Patient Active Problem List   Diagnosis Date Noted   Chronic fatigue 03/09/2023   Hearing impairment 09/09/2022   Dyslipidemia 11/07/2019   Vitamin D deficiency 08/05/2017   Hypothyroidism 11/24/2011   Environmental allergies 11/24/2011   Fibromyalgia 11/24/2011   Irritable bowel syndrome (IBS) 11/24/2011   Osteoporosis 11/24/2011    PCP: Rana Snare DO   REFERRING PROVIDER: Mercie Eon, MD  REFERRING DIAG: M79.7 (ICD-10-CM) -  Fibromyalgia  THERAPY DIAG:  Fibromyalgia  Rationale for Evaluation and Treatment: Rehabilitation  ONSET DATE: 20 years   SUBJECTIVE:   SUBJECTIVE STATEMENT: Patient reports that her pain and overall fatigue continue.  PERTINENT HISTORY: H/o migraines  Fatigue  Osteopenia Fibromyalgia   PAIN:  Are you having pain?  Yes: NPRS scale: not rated. Its a [place] level all the time.   Pain location: upper back, general   Pain description: aching  Aggravating factors: walking  Relieving factors: rest   PRECAUTIONS: Other: osteopenia  RED FLAGS: None   WEIGHT BEARING RESTRICTIONS: No  FALLS:  Has patient fallen in last 6 months?  No just 1 near fall   LIVING ENVIRONMENT: Lives with: lives alone Lives in: House/apartment Stairs: Yes: Internal: 6 STE, 9 down the stairs split level  steps; on right going up Has following equipment at home: Single point cane and Grab bars  OCCUPATION: retired in clinical lab   PLOF: Independent, Vocation/Vocational requirements: art degree, clinical lab work , Leisure: cats, and cared for her mother    PATIENT GOALS: I want to be able to move better and feel better   NEXT MD VISIT: unknown   OBJECTIVE:  Note: Objective measures were completed at Evaluation unless otherwise noted.  XR:  DIAGNOSTIC FINDINGS:  FINDINGS: No fracture or dislocation. No spondylolisthesis. Mild thoracic kyphosis. Vertebral endplate spurring at multiple levels in  the mid and lower thoracic spine.   IMPRESSION: No acute findings. Multilevel degenerative changes as above.  PATIENT SURVEYS:  Fatigue Assessment Scale given on eval   COGNITION: Overall cognitive status: Within functional limits for tasks assessed     SENSATION: WFL  EDEMA: None    MUSCLE LENGTH: Hamstrings: tight  Thomas test: tight   POSTURE: rounded shoulders, forward head, and increased thoracic kyphosis  PALPATION: Pain with palpation to Rt lateral and anterior hip and thigh,  It feels "bruised"  LOWER EXTREMITY ROM:  Active ROM Right eval Left eval  Hip flexion Min pain WNL  WNL   Hip extension    Hip abduction    Hip adduction    Hip internal rotation Pain, WNL Pain WNL  Hip external rotation Pain, WNL  Pain WNL   Knee flexion WNL WNL   Knee extension WNL WNL   Ankle dorsiflexion    Ankle plantarflexion    Ankle inversion    Ankle eversion     (Blank rows = not tested)  Trunk motion: Flexion NT due bone density Extension WFL Rotation 25% each direction   LOWER EXTREMITY MMT:  MMT Right eval Left eval  Hip flexion 4 4  Hip extension    Hip abduction 3- 3-  Hip adduction    Hip internal rotation    Hip external rotation    Knee flexion 4 4  Knee extension 4 4  Ankle dorsiflexion    Ankle plantarflexion    Ankle inversion    Ankle eversion     (Blank rows = not tested)  LOWER EXTREMITY SPECIAL TESTS:  Hip special tests: Luisa Hart (FABER) test: positive  and Hip scouring test: positive   FUNCTIONAL TESTS:  5 times sit to stand: 16 sec  2 minute walk test: 473 feet, pain 1/10   GAIT: Distance walked: 473 Assistive device utilized: None Level of assistance: Complete Independence Comments: min increase in hip pain                                                                                                                                TREATMENT DATE:  [facility] Adult PT Treatment:                                                DATE: 01/11/24 Therapeutic Exercise: NuStep L5 UE and LE for 6 min while taking subjective FM row 2x10 17# FM shoulder extension 17# 2x10 Standing hip abduction/extension 2x10 ea BIL SLR x 10 each Supine horizontal abd 2x15 GTB Supine chest press 2x10 3# dumbbells  Supine alternating shoulder flex/ext 3# 2x10 BIL Hooklying clamshell 2x15 GTB Bridge 2 x 10 - no cramps today Seated hamstring stretch 2 x 30" BIL STS 2x10 - no UE    [facility] Adult PT Treatment:  DATE: 01/04/24 Therapeutic Exercise: NuStep L5 UE and LE for 6 min while taking subjective FM row 2x10 17# FM shoulder extension 13# 2x10 SLR x 10 each - no rest breaks needed today Supine horizontal abd 2x15 RTB Supine chest press 2x10 2# dumbbells  Supine alternating shoulder flex/ext 2# 2x10 BIL Hooklying clamshell 2x15 GTB Bridge 2 x 10 - no cramps today Seated hamstring stretch 2 x 30" BIL STS 2x10 - no UE    [facility] Adult PT Treatment:                                                DATE: 12/28/23 Therapeutic Exercise: NuStep L5 UE and LE for 6 min while taking subjective FM row 2x10 17# SLR x 10 each - no rest breaks needed today SAQ 2x10 Supine horizontal abd 2x15 YTB Supine chest press 2x10 2# dumbbells  Hooklying clamshell 2x15 GTB Bridge x 10 - no cramps today Seated hamstring stretch 2 x 30" BIL STS 2x10 - no UE high table   PATIENT EDUCATION:  Education details: Fatigue, PT, POC  strengthening and walking for bone density , aquatics  Person educated: Patient Education method: Explanation, Demonstration, and Handouts Education comprehension: verbalized understanding and needs further education  HOME EXERCISE PROGRAM: Access Code: [email or web address] URL: [email or web address] Date: 11/23/2023 Prepared by: Karie Mainland  Exercises - Hooklying Single Knee to Chest Stretch  - 1 x daily - 7 x weekly - 1 sets - 5 reps - 30 hold - Supine Bridge  - 1 x daily - 7 x weekly - 2 sets - 10 reps - 5 hold - Supine Lower Trunk Rotation  - 1 x daily - 7 x weekly - 2 sets - 10 reps - 10 hold - Standing Hip Abduction with Anterior Support  - 1 x daily - 7 x weekly - 2 sets - 10 reps - Standing Marching  - 1 x daily - 7 x weekly - 2 sets - 10 reps  ASSESSMENT:  CLINICAL IMPRESSION: Patient presents to PT reporting no changes since last session. Session today continued to focus on core, proximal hip, and periscapular strengthening. More standing exercises incorporated today to  good effect. Patient was able to tolerate all prescribed exercises with no adverse effects. Patient continues to benefit from skilled PT services and should be progressed as able to improve functional independence.   OBJECTIVE IMPAIRMENTS: decreased activity tolerance, decreased endurance, decreased mobility, difficulty walking, decreased ROM, decreased strength, increased fascial restrictions, increased muscle spasms, impaired flexibility, impaired UE functional use, postural dysfunction, and pain.   ACTIVITY LIMITATIONS: carrying, lifting, bending, squatting, transfers, bed mobility, and locomotion level  PARTICIPATION LIMITATIONS: meal prep, cleaning, laundry, interpersonal relationship, shopping, and community activity  PERSONAL FACTORS: Behavior pattern, Time since onset of injury/illness/exacerbation, and 1-2 comorbidities: osteopenia, fatigue   are also affecting patient's functional outcome.   REHAB POTENTIAL: Excellent  CLINICAL DECISION MAKING: Stable/uncomplicated  EVALUATION COMPLEXITY: [place]   GOALS: Goals reviewed with patient? Yes  LONG TERM GOALS: Target date: 01/04/2024  Pt will be I with HEP for posture and general mobility  Baseline: unknown, given basic LE on eval  Goal status: MET Pt reports adherence 01/11/24   2.  Pt will be able to report hip pain resolved when walking in her home  Baseline: 1/10-2/10 Goal status: Ongoing  3.  Patient will be able to  begin walking (weather permitting) 20 min x 2-3 per week  Baseline: has not done this due to cold  Goal status: Ongoing  4.  Pt will be able to tolerate 30 min of standing, light home tasks before needing to sit or lie down  Baseline: 15 min  Goal status: Ongoing  5.  Fatigue Score will improve to 30/36 to show an impact on mobility  Baseline:  (scored 36/50) Goal status: INITIAL  PLAN:  PT FREQUENCY: 1-2x/week  PT DURATION: 6 weeks  PLANNED INTERVENTIONS: 97164- PT Re-evaluation, 97110-Therapeutic  exercises, 97530- Therapeutic activity, 97535- Self Care, [id number]- Manual therapy, [id number]- Aquatic Therapy, Patient/Family education, Cryotherapy, and Moist heat  PLAN FOR NEXT SESSION: NuStep, check HEP, posture- upper back.  No aquatics at this time.   Berta Minor PTA 01/11/24 5:04 PM Phone: [phone] Fax: [phone]

## 2024-01-18 ENCOUNTER — Ambulatory Visit

## 2024-01-18 DIAGNOSIS — M797 Fibromyalgia: Secondary | ICD-10-CM

## 2024-01-18 NOTE — Therapy (Signed)
 OUTPATIENT PHYSICAL THERAPY NOTE   Patient Name: Jacqueline Wyatt MRN: [id number] DOB:June 07, 1946, 78 y.o., female Today's Date: 01/18/2024  END OF SESSION:  PT End of Session - 01/18/24 1359     Visit Number 8    Number of Visits 8    Date for PT Re-Evaluation 01/18/24    Authorization Type UHC MCR    Authorization Time Period 6 visits 01/16/24-03/17/24    Authorization - Visit Number 7    Authorization - Number of Visits 12    PT Start Time 1400    PT Stop Time 1438    PT Time Calculation (min) 38 min    Activity Tolerance Patient tolerated treatment well    Behavior During Therapy [facility] for tasks assessed/performed             Past Medical History:  Diagnosis Date   Allergy    Arthritis    Asthma    Chest pain of uncertain etiology 03/25/2021   Chronic pain syndrome 08/05/2017   Essential hypertension 05/27/2021   Fibromyalgia    Fibromyalgia muscle pain    Neuromuscular disorder (HCC)    Osteoporosis    Right foot pain 07/29/2022   Patient has localized pain to medial aspect of right heel.  There is no visible swelling, redness or deformity.  After the physical examination I do not anticipate a fracture or acute bone pathology.  I will prescribe topical Voltaren gel for symptomatic management.  We will reevaluate the patient on the follow-up visit.   Thyroid disease    Past Surgical History:  Procedure Laterality Date   LEFT HEART CATH AND CORONARY ANGIOGRAPHY N/A 03/24/2021   Procedure: LEFT HEART CATH AND CORONARY ANGIOGRAPHY;  Surgeon: Corky Crafts, MD;  Location: [facility] INVASIVE CV LAB;  Service: Cardiovascular;  Laterality: N/A;   Patient Active Problem List   Diagnosis Date Noted   Chronic fatigue 03/09/2023   Hearing impairment 09/09/2022   Dyslipidemia 11/07/2019   Vitamin D deficiency 08/05/2017   Hypothyroidism 11/24/2011   Environmental allergies 11/24/2011   Fibromyalgia 11/24/2011   Irritable bowel syndrome (IBS) 11/24/2011   Osteoporosis 11/24/2011     PCP: Rana Snare DO   REFERRING PROVIDER: Mercie Eon, MD  REFERRING DIAG: M79.7 (ICD-10-CM) - Fibromyalgia  THERAPY DIAG:  Fibromyalgia  Rationale for Evaluation and Treatment: Rehabilitation  ONSET DATE: 20 years   SUBJECTIVE:   SUBJECTIVE STATEMENT: Patient reports that her Rt hip pain continues, especially when she is lying on the Rt side at night. She does not report much benefit from PT thus far.  PERTINENT HISTORY: H/o migraines  Fatigue  Osteopenia Fibromyalgia   PAIN:  Are you having pain?  Yes: NPRS scale: not rated. Its a [place] level all the time.   Pain location: upper back, general   Pain description: aching  Aggravating factors: walking  Relieving factors: rest   PRECAUTIONS: Other: osteopenia  RED FLAGS: None   WEIGHT BEARING RESTRICTIONS: No  FALLS:  Has patient fallen in last 6 months?  No just 1 near fall   LIVING ENVIRONMENT: Lives with: lives alone Lives in: House/apartment Stairs: Yes: Internal: 6 STE, 9 down the stairs split level  steps; on right going up Has following equipment at home: Single point cane and Grab bars  OCCUPATION: retired in clinical lab   PLOF: Independent, Vocation/Vocational requirements: art degree, clinical lab work , Leisure: cats, and cared for her mother    PATIENT GOALS: I want to be able to move better and  feel better   NEXT MD VISIT: unknown   OBJECTIVE:  Note: Objective measures were completed at Evaluation unless otherwise noted.  XR:  DIAGNOSTIC FINDINGS:  FINDINGS: No fracture or dislocation. No spondylolisthesis. Mild thoracic kyphosis. Vertebral endplate spurring at multiple levels in the mid and lower thoracic spine.   IMPRESSION: No acute findings. Multilevel degenerative changes as above.  PATIENT SURVEYS:  Fatigue Assessment Scale given on eval   COGNITION: Overall cognitive status: Within functional limits for tasks assessed     SENSATION: WFL  EDEMA: None    MUSCLE  LENGTH: Hamstrings: tight  Thomas test: tight   POSTURE: rounded shoulders, forward head, and increased thoracic kyphosis  PALPATION: Pain with palpation to Rt lateral and anterior hip and thigh, It feels "bruised"  LOWER EXTREMITY ROM:  Active ROM Right eval Left eval  Hip flexion Min pain WNL  WNL   Hip extension    Hip abduction    Hip adduction    Hip internal rotation Pain, WNL Pain WNL  Hip external rotation Pain, WNL  Pain WNL   Knee flexion WNL WNL   Knee extension WNL WNL   Ankle dorsiflexion    Ankle plantarflexion    Ankle inversion    Ankle eversion     (Blank rows = not tested)  Trunk motion: Flexion NT due bone density Extension WFL Rotation 25% each direction   LOWER EXTREMITY MMT:  MMT Right eval Left eval  Hip flexion 4 4  Hip extension    Hip abduction 3- 3-  Hip adduction    Hip internal rotation    Hip external rotation    Knee flexion 4 4  Knee extension 4 4  Ankle dorsiflexion    Ankle plantarflexion    Ankle inversion    Ankle eversion     (Blank rows = not tested)  LOWER EXTREMITY SPECIAL TESTS:  Hip special tests: Luisa Hart (FABER) test: positive  and Hip scouring test: positive   FUNCTIONAL TESTS:  5 times sit to stand: 16 sec  2 minute walk test: 473 feet, pain 1/10   GAIT: Distance walked: 473 Assistive device utilized: None Level of assistance: Complete Independence Comments: min increase in hip pain                                                                                                                                TREATMENT DATE:  [facility] Adult PT Treatment:                                                DATE: 01/18/24 Therapeutic Exercise: NuStep L5 UE and LE for 6 min while taking subjective Standing hip abduction/extension 2x10 ea BIL SLR x 10 each Supine horizontal abd 2x15 GTB Supine chest press 2x10 3# dumbbells  Supine alternating shoulder  flex/ext 3# 2x10 BIL Hooklying clamshell 2x15 GTB Bridge 2 x  10 - no cramps today Seated hamstring stretch 2 x 30" BIL STS 2x10 - no UE    [facility] Adult PT Treatment:                                                DATE: 01/11/24 Therapeutic Exercise: NuStep L5 UE and LE for 6 min while taking subjective FM row 2x10 17# FM shoulder extension 17# 2x10 Standing hip abduction/extension 2x10 ea BIL SLR x 10 each Supine horizontal abd 2x15 GTB Supine chest press 2x10 3# dumbbells  Supine alternating shoulder flex/ext 3# 2x10 BIL Hooklying clamshell 2x15 GTB Bridge 2 x 10 - no cramps today Seated hamstring stretch 2 x 30" BIL STS 2x10 - no UE    [facility] Adult PT Treatment:                                                DATE: 01/04/24 Therapeutic Exercise: NuStep L5 UE and LE for 6 min while taking subjective FM row 2x10 17# FM shoulder extension 13# 2x10 SLR x 10 each - no rest breaks needed today Supine horizontal abd 2x15 RTB Supine chest press 2x10 2# dumbbells  Supine alternating shoulder flex/ext 2# 2x10 BIL Hooklying clamshell 2x15 GTB Bridge 2 x 10 - no cramps today Seated hamstring stretch 2 x 30" BIL STS 2x10 - no UE     PATIENT EDUCATION:  Education details: Fatigue, PT, POC  strengthening and walking for bone density , aquatics  Person educated: Patient Education method: Explanation, Demonstration, and Handouts Education comprehension: verbalized understanding and needs further education  HOME EXERCISE PROGRAM: Access Code: [email or web address] URL: [email or web address] Date: 01/18/2024 Prepared by: Berta Minor  Exercises - Hooklying Single Knee to Chest Stretch  - 1 x daily - 7 x weekly - 1 sets - 5 reps - 30 hold - Supine Bridge  - 1 x daily - 7 x weekly - 2 sets - 10 reps - 5 hold - Supine Lower Trunk Rotation  - 1 x daily - 7 x weekly - 2 sets - 10 reps - 10 hold - Standing Hip Abduction with Anterior Support  - 1 x daily - 7 x weekly - 2 sets - 10 reps - Standing Marching  - 1 x daily - 7 x weekly - 2 sets - 10  reps - Supine Shoulder Horizontal Abduction with Resistance  - 1 x daily - 7 x weekly - 2 sets - 10 reps - 5 hold - Sit to Stand  - 1 x daily - 7 x weekly - 2 sets - 10 reps  ASSESSMENT:  CLINICAL IMPRESSION: Patient presents to PT reporting no changes since last session, that her fatigue and hip pain are the "same ole same ole." Session today continues to focus on core, proximal hip, and periscapular strengthening. Patient was able to tolerate all prescribed exercises with no adverse effects. She may be appropriate for DC from PT with home program at next session.   OBJECTIVE IMPAIRMENTS: decreased activity tolerance, decreased endurance, decreased mobility, difficulty walking, decreased ROM, decreased strength, increased fascial restrictions, increased muscle spasms, impaired flexibility, impaired UE functional use, postural dysfunction, and pain.  ACTIVITY LIMITATIONS: carrying, lifting, bending, squatting, transfers, bed mobility, and locomotion level  PARTICIPATION LIMITATIONS: meal prep, cleaning, laundry, interpersonal relationship, shopping, and community activity  PERSONAL FACTORS: Behavior pattern, Time since onset of injury/illness/exacerbation, and 1-2 comorbidities: osteopenia, fatigue   are also affecting patient's functional outcome.   REHAB POTENTIAL: Excellent  CLINICAL DECISION MAKING: Stable/uncomplicated  EVALUATION COMPLEXITY: [place]   GOALS: Goals reviewed with patient? Yes  LONG TERM GOALS: Target date: 01/04/2024  Pt will be I with HEP for posture and general mobility  Baseline: unknown, given basic LE on eval  Goal status: MET Pt reports adherence 01/11/24   2.  Pt will be able to report hip pain resolved when walking in her home  Baseline: 1/10-2/10 Goal status: Ongoing  3.  Patient will be able to begin walking (weather permitting) 20 min x 2-3 per week  Baseline: has not done this due to cold  Goal status: Ongoing  4.  Pt will be able to tolerate 30  min of standing, light home tasks before needing to sit or lie down  Baseline: 15 min  Goal status: Ongoing  5.  Fatigue Score will improve to 30/36 to show an impact on mobility  Baseline:  (scored 36/50) Goal status: INITIAL  PLAN:  PT FREQUENCY: 1-2x/week  PT DURATION: 6 weeks  PLANNED INTERVENTIONS: 97164- PT Re-evaluation, 97110-Therapeutic exercises, 97530- Therapeutic activity, 97535- Self Care, 16109- Manual therapy, [id number]- Aquatic Therapy, Patient/Family education, Cryotherapy, and Moist heat  PLAN FOR NEXT SESSION: NuStep, check HEP, posture- upper back.  No aquatics at this time.   Berta Minor PTA 01/18/24 2:37 PM Phone: [phone] Fax: [phone]

## 2024-01-25 ENCOUNTER — Encounter: Payer: Self-pay | Admitting: Physical Therapy

## 2024-01-25 ENCOUNTER — Ambulatory Visit: Attending: Internal Medicine | Admitting: Physical Therapy

## 2024-01-25 DIAGNOSIS — M797 Fibromyalgia: Secondary | ICD-10-CM | POA: Insufficient documentation

## 2024-01-25 NOTE — Therapy (Signed)
 OUTPATIENT PHYSICAL THERAPY NOTE  Discharge  Patient Name: Jacqueline Wyatt MRN: [id number] DOB:08-26-1946, 78 y.o., female Today's Date: 01/25/2024  END OF SESSION:  PT End of Session - 01/25/24 1510     Visit Number 9    Date for PT Re-Evaluation 01/18/24    Authorization Type UHC MCR    Authorization Time Period 6 visits 01/16/24-03/17/24    Authorization - Visit Number 8    Authorization - Number of Visits 12    PT Start Time 1505    PT Stop Time 1545    PT Time Calculation (min) 40 min    Activity Tolerance Patient tolerated treatment well    Behavior During Therapy WFL for tasks assessed/performed            PHYSICAL THERAPY DISCHARGE SUMMARY  Visits from Start of Care: 9  Current functional level related to goals / functional outcomes: See below   Remaining deficits: Fatigue, hip and core strength, endurance   Education / Equipment: Extensive on fatigue, energy muscle strength, exercise  Patient agrees to discharge. Patient goals were partially met. Patient is being discharged due to maximized rehab potential.    Past Medical History:  Diagnosis Date   Allergy    Arthritis    Asthma    Chest pain of uncertain etiology 03/25/2021   Chronic pain syndrome 08/05/2017   Essential hypertension 05/27/2021   Fibromyalgia    Fibromyalgia muscle pain    Neuromuscular disorder (HCC)    Osteoporosis    Right foot pain 07/29/2022   Patient has localized pain to medial aspect of right heel.  There is no visible swelling, redness or deformity.  After the physical examination I do not anticipate a fracture or acute bone pathology.  I will prescribe topical Voltaren gel for symptomatic management.  We will reevaluate the patient on the follow-up visit.   Thyroid disease    Past Surgical History:  Procedure Laterality Date   LEFT HEART CATH AND CORONARY ANGIOGRAPHY N/A 03/24/2021   Procedure: LEFT HEART CATH AND CORONARY ANGIOGRAPHY;  Surgeon: Corky Crafts, MD;   Location: [facility] INVASIVE CV LAB;  Service: Cardiovascular;  Laterality: N/A;   Patient Active Problem List   Diagnosis Date Noted   Chronic fatigue 03/09/2023   Hearing impairment 09/09/2022   Dyslipidemia 11/07/2019   Vitamin D deficiency 08/05/2017   Hypothyroidism 11/24/2011   Environmental allergies 11/24/2011   Fibromyalgia 11/24/2011   Irritable bowel syndrome (IBS) 11/24/2011   Osteoporosis 11/24/2011    PCP: Rana Snare DO   REFERRING PROVIDER: Mercie Eon, MD  REFERRING DIAG: M79.7 (ICD-10-CM) - Fibromyalgia  THERAPY DIAG:  Fibromyalgia  Rationale for Evaluation and Treatment: Rehabilitation  ONSET DATE: 20 years   SUBJECTIVE:   SUBJECTIVE STATEMENT: No pain in my hip.  It's the exhaustion that gets me.  She does her home exercises 2-3 x per week.    PERTINENT HISTORY: H/o migraines  Fatigue  Osteopenia Fibromyalgia   PAIN:  Are you having pain?  Yes: NPRS scale: not rated. Its a [place] level all the time.   Pain location: upper back, general   Pain description: aching  Aggravating factors: walking  Relieving factors: rest   PRECAUTIONS: Other: osteopenia  RED FLAGS: None   WEIGHT BEARING RESTRICTIONS: No  FALLS:  Has patient fallen in last 6 months?  No just 1 near fall   LIVING ENVIRONMENT: Lives with: lives alone Lives in: House/apartment Stairs: Yes: Internal: 6 STE, 9 down the stairs split level  steps; on right going up Has following equipment at home: Single point cane and Grab bars  OCCUPATION: retired in clinical lab   PLOF: Independent, Vocation/Vocational requirements: art degree, clinical lab work , Leisure: cats, and cared for her mother    PATIENT GOALS: I want to be able to move better and feel better   NEXT MD VISIT: unknown   OBJECTIVE:  Note: Objective measures were completed at Evaluation unless otherwise noted.  XR:  DIAGNOSTIC FINDINGS:  FINDINGS: No fracture or dislocation. No spondylolisthesis. Mild  thoracic kyphosis. Vertebral endplate spurring at multiple levels in the mid and lower thoracic spine.   IMPRESSION: No acute findings. Multilevel degenerative changes as above.  PATIENT SURVEYS:  Fatigue Assessment Scale given on eval   COGNITION: Overall cognitive status: Within functional limits for tasks assessed     SENSATION: WFL  EDEMA: None    MUSCLE LENGTH: Hamstrings: tight  Thomas test: tight   POSTURE: rounded shoulders, forward head, and increased thoracic kyphosis  PALPATION: Pain with palpation to Rt lateral and anterior hip and thigh, It feels "bruised"  LOWER EXTREMITY ROM:  Active ROM Right eval Left eval  Hip flexion Min pain WNL  WNL   Hip extension    Hip abduction    Hip adduction    Hip internal rotation Pain, WNL Pain WNL  Hip external rotation Pain, WNL  Pain WNL   Knee flexion WNL WNL   Knee extension WNL WNL   Ankle dorsiflexion    Ankle plantarflexion    Ankle inversion    Ankle eversion     (Blank rows = not tested)  Trunk motion: Flexion NT due bone density Extension WFL Rotation 25% each direction   LOWER EXTREMITY MMT:  MMT Right eval Left eval Rt/Lt. 01/25/24  Hip flexion 4 4 5/5, 4+/5  Hip extension     Hip abduction 3- 3- 4/5, 4-/5  Hip adduction     Hip internal rotation     Hip external rotation     Knee flexion 4 4 5/5, 4+/5  Knee extension 4 4 5/5, 5/5  Ankle dorsiflexion     Ankle plantarflexion     Ankle inversion     Ankle eversion      (Blank rows = not tested)  LOWER EXTREMITY SPECIAL TESTS:  Hip special tests: Luisa Hart (FABER) test: positive  and Hip scouring test: positive   FUNCTIONAL TESTS:  5 times sit to stand: 16 sec  2 minute walk test: 473 feet, pain 1/10   01/25/24: 5 x STS: 14 sec    2 min walk test: NT GAIT: Distance walked: 473 Assistive device utilized: None Level of assistance: Complete Independence Comments: min increase in hip pain                                                                                                                                 TREATMENT DATE:   [facility] Adult  PT Treatment:                                                DATE: 01/25/24  Therapeutic Activity: MMT, Sit to stand x 5  NuStep LE and UE for 5 min L6  Hip abduction  Self Care: Theories of fibromyalgia and chronic pain Movement snacks Activities versus exercise Walking program The role of an exercise partner for accountability Muscle fatigue versus systemic fatigue   [facility] Adult PT Treatment:                                                DATE: 01/18/24 Therapeutic Exercise: NuStep L5 UE and LE for 6 min while taking subjective Standing hip abduction/extension 2x10 ea BIL SLR x 10 each Supine horizontal abd 2x15 GTB Supine chest press 2x10 3# dumbbells  Supine alternating shoulder flex/ext 3# 2x10 BIL Hooklying clamshell 2x15 GTB Bridge 2 x 10 - no cramps today Seated hamstring stretch 2 x 30" BIL STS 2x10 - no UE    [facility] Adult PT Treatment:                                                DATE: 01/11/24 Therapeutic Exercise: NuStep L5 UE and LE for 6 min while taking subjective FM row 2x10 17# FM shoulder extension 17# 2x10 Standing hip abduction/extension 2x10 ea BIL SLR x 10 each Supine horizontal abd 2x15 GTB Supine chest press 2x10 3# dumbbells  Supine alternating shoulder flex/ext 3# 2x10 BIL Hooklying clamshell 2x15 GTB Bridge 2 x 10 - no cramps today Seated hamstring stretch 2 x 30" BIL STS 2x10 - no UE    [facility] Adult PT Treatment:                                                DATE: 01/04/24 Therapeutic Exercise: NuStep L5 UE and LE for 6 min while taking subjective FM row 2x10 17# FM shoulder extension 13# 2x10 SLR x 10 each - no rest breaks needed today Supine horizontal abd 2x15 RTB Supine chest press 2x10 2# dumbbells  Supine alternating shoulder flex/ext 2# 2x10 BIL Hooklying clamshell 2x15 GTB Bridge 2 x 10 - no cramps today Seated hamstring  stretch 2 x 30" BIL STS 2x10 - no UE     PATIENT EDUCATION:  Education details: Fatigue, PT, POC  strengthening and walking for bone density , aquatics  Person educated: Patient Education method: Explanation, Demonstration, and Handouts Education comprehension: verbalized understanding and needs further education  HOME EXERCISE PROGRAM: Access Code: [email or web address] URL: [email or web address] Date: 01/18/2024 Prepared by: Berta Minor  Exercises - Hooklying Single Knee to Chest Stretch  - 1 x daily - 7 x weekly - 1 sets - 5 reps - 30 hold - Supine Bridge  - 1 x daily - 7 x weekly - 2 sets - 10 reps - 5 hold - Supine Lower Trunk Rotation  - 1 x daily - 7 x  weekly - 2 sets - 10 reps - 10 hold - Standing Hip Abduction with Anterior Support  - 1 x daily - 7 x weekly - 2 sets - 10 reps - Standing Marching  - 1 x daily - 7 x weekly - 2 sets - 10 reps - Supine Shoulder Horizontal Abduction with Resistance  - 1 x daily - 7 x weekly - 2 sets - 10 reps - 5 hold - Sit to Stand  - 1 x daily - 7 x weekly - 2 sets - 10 reps  ASSESSMENT:  CLINICAL IMPRESSION: Patient continues to have what she describes as severe exhaustion each and every day.  She scored 30/50  on the FAS, which was the goal set.   She does have a bit more strength in her lower body.  She was encouraged to cont her home program.  She has not been able to walk due to the cold and lack of motivation.  She has difficulty with standing and walking.  She has to get up and change positions a lot due to stiffness. She may benefit from further workup to assess nutritional status, deficiencies or other causes of her fatigue. She is now discharged from PT.   OBJECTIVE IMPAIRMENTS: decreased activity tolerance, decreased endurance, decreased mobility, difficulty walking, decreased ROM, decreased strength, increased fascial restrictions, increased muscle spasms, impaired flexibility, impaired UE functional use, postural dysfunction,  and pain.   ACTIVITY LIMITATIONS: carrying, lifting, bending, squatting, transfers, bed mobility, and locomotion level  PARTICIPATION LIMITATIONS: meal prep, cleaning, laundry, interpersonal relationship, shopping, and community activity  PERSONAL FACTORS: Behavior pattern, Time since onset of injury/illness/exacerbation, and 1-2 comorbidities: osteopenia, fatigue   are also affecting patient's functional outcome.   REHAB POTENTIAL: Excellent  CLINICAL DECISION MAKING: Stable/uncomplicated  EVALUATION COMPLEXITY: [place]   GOALS: Goals reviewed with patient? Yes  LONG TERM GOALS: Target date: 01/04/2024  Pt will be I with HEP for posture and general mobility  Baseline: unknown, given basic LE on eval  Goal status: MET Pt reports adherence 01/11/24   2.  Pt will be able to report hip pain resolved when walking in her home  Baseline: 1/10-2/10 Goal status: MET , not unless she is walking out of the house, shopping   3.  Patient will be able to begin walking (weather permitting) 20 min x 2-3 per week  Baseline: has not done this due to cold.  Goal status: NOT MET   4.  Pt will be able to tolerate 30 min of standing, light home tasks before needing to sit or lie down  Baseline: 15 min - continues Goal status: NOT MET   5.  Fatigue Score will improve to 30/36 to show an impact on mobility  Baseline:  (scored 36/50) 30/50  Goal status: MET   PLAN:  PT FREQUENCY: 1-2x/week  PT DURATION: 6 weeks  PLANNED INTERVENTIONS: 97164- PT Re-evaluation, 97110-Therapeutic exercises, 97530- Therapeutic activity, 97535- Self Care, 08657- Manual therapy, [phone]- Aquatic Therapy, Patient/Family education, Cryotherapy, and Moist heat  PLAN FOR NEXT SESSION :  NA, DC   Lanell Persons PT 01/25/24 7:19 PM Phone: [phone] Fax: [phone]

## 2024-03-28 ENCOUNTER — Other Ambulatory Visit: Payer: Self-pay | Admitting: Student

## 2024-03-28 DIAGNOSIS — M797 Fibromyalgia: Secondary | ICD-10-CM

## 2024-03-28 DIAGNOSIS — E039 Hypothyroidism, unspecified: Secondary | ICD-10-CM

## 2024-03-28 NOTE — Telephone Encounter (Signed)
 Copied from CRM [id number]. Topic: Clinical - Medication Refill >> Mar 28, 2024  4:07 PM Adrianna P wrote: Medication: levothyroxine  (SYNTHROID ) 50 MCG tablet  traMADol  (ULTRAM ) 50 MG tablet  Has the patient contacted their pharmacy? No (Agent: If no, request that the patient contact the pharmacy for the refill. If patient does not wish to contact the pharmacy document the reason why and proceed with request.) (Agent: If yes, when and what did the pharmacy advise?)  This is the patient's preferred pharmacy:  [id number] Jonette Nestle, [place] - [address] AT [facility] & [address] [place] [address] Phone: [phone] Fax: [phone]  Is this the correct pharmacy for this prescription? Yes If no, delete pharmacy and type the correct one.   Has the prescription been filled recently? No  Is the patient out of the medication? No  Has the patient been seen for an appointment in the last year OR does the patient have an upcoming appointment? Yes  Can we respond through MyChart? No  Agent: Please be advised that Rx refills may take up to 3 business days. We ask that you follow-up with your pharmacy.

## 2024-03-29 MED ORDER — LEVOTHYROXINE SODIUM 50 MCG PO TABS
ORAL_TABLET | ORAL | 3 refills | Status: DC
Start: 2024-03-29 — End: 2024-09-18

## 2024-03-29 MED ORDER — TRAMADOL HCL 50 MG PO TABS
25.0000 mg | ORAL_TABLET | Freq: Two times a day (BID) | ORAL | 2 refills | Status: AC
Start: 2024-03-29 — End: ?

## 2024-04-11 ENCOUNTER — Other Ambulatory Visit: Payer: Self-pay | Admitting: Student

## 2024-04-11 DIAGNOSIS — M797 Fibromyalgia: Secondary | ICD-10-CM

## 2024-04-11 MED ORDER — TRAMADOL HCL 50 MG PO TABS
25.0000 mg | ORAL_TABLET | Freq: Two times a day (BID) | ORAL | 2 refills | Status: DC
Start: 2024-04-11 — End: 2024-04-18

## 2024-04-12 ENCOUNTER — Other Ambulatory Visit: Payer: Self-pay | Admitting: Student

## 2024-04-12 DIAGNOSIS — E039 Hypothyroidism, unspecified: Secondary | ICD-10-CM

## 2024-04-12 NOTE — Telephone Encounter (Unsigned)
 Copied from CRM [id number]. Topic: Clinical - Medication Refill >> Mar 28, 2024  4:07 PM Adrianna P wrote: Medication: levothyroxine  (SYNTHROID ) 50 MCG tablet  traMADol  (ULTRAM ) 50 MG tablet  Has the patient contacted their pharmacy? No (Agent: If no, request that the patient contact the pharmacy for the refill. If patient does not wish to contact the pharmacy document the reason why and proceed with request.) (Agent: If yes, when and what did the pharmacy advise?)  This is the patient's preferred pharmacy:  [id number] Jonette Nestle, [place] - [address] AT [facility] OF [facility] & [address] [place] [address] Phone: [phone] Fax: [phone]  Is this the correct pharmacy for this prescription? Yes If no, delete pharmacy and type the correct one.   Has the prescription been filled recently? No  Is the patient out of the medication? No  Has the patient been seen for an appointment in the last year OR does the patient have an upcoming appointment? Yes  Can we respond through MyChart? No  Agent: Please be advised that Rx refills may take up to 3 business days. We ask that you follow-up with your pharmacy. >> Apr 12, 2024  4:28 PM Retta Caster wrote: traMADol  (ULTRAM ) 50 MG tablet     Patient is trying ot refill this medication but states it is getting sent to wrong PCP for refill. Needs all back when updated.  [phone] call after 12noon let it ring 7 times

## 2024-04-16 NOTE — Telephone Encounter (Signed)
 Received a fax from the pharmacy for Tramadol - per pharmacy still not letting us  fill this rx and contacted patient to let her know she might have to get it elsewhere. She will be contacting provider. If you want to close these from [facility] or use electronic system to do so, thanks.    Jacqueline Wyatt can you confirm which rx she pick up?

## 2024-04-18 ENCOUNTER — Other Ambulatory Visit: Payer: Self-pay | Admitting: Student

## 2024-04-18 DIAGNOSIS — M797 Fibromyalgia: Secondary | ICD-10-CM

## 2024-04-18 NOTE — Telephone Encounter (Signed)
 Copied from CRM [id number]. Topic: Clinical - Medication Refill >> Apr 18, 2024  4:27 PM Merlynn A wrote: Medication: traMADol  (ULTRAM ) 50 MG tablet  Has the patient contacted their pharmacy? Yes (Agent: If no, request that the patient contact the pharmacy for the refill. If patient does not wish to contact the pharmacy document the reason why and proceed with request.) (Agent: If yes, when and what did the pharmacy advise?)  This is the patient's preferred pharmacy:  [facility]  [place] near [facility]  Is this the correct pharmacy for this prescription? Yes If no, delete pharmacy and type the correct one.   Has the prescription been filled recently? Yes  Is the patient out of the medication? Yes  Has the patient been seen for an appointment in the last year OR does the patient have an upcoming appointment? Yes  Can we respond through MyChart? No  Agent: Please be advised that Rx refills may take up to 3 business days. We ask that you follow-up with your pharmacy.

## 2024-04-19 MED ORDER — TRAMADOL HCL 50 MG PO TABS: 25.0000 mg | ORAL_TABLET | Freq: Two times a day (BID) | ORAL | 5 refills | Status: DC

## 2024-05-02 ENCOUNTER — Ambulatory Visit: Payer: Self-pay | Admitting: Student

## 2024-05-02 VITALS — BP 139/89 | HR 78 | Temp 98.2°F | Ht 65.0 in | Wt 192.2 lb

## 2024-05-02 DIAGNOSIS — E785 Hyperlipidemia, unspecified: Secondary | ICD-10-CM

## 2024-05-02 DIAGNOSIS — E559 Vitamin D deficiency, unspecified: Secondary | ICD-10-CM

## 2024-05-02 DIAGNOSIS — M797 Fibromyalgia: Secondary | ICD-10-CM | POA: Diagnosis not present

## 2024-05-02 DIAGNOSIS — H04123 Dry eye syndrome of bilateral lacrimal glands: Secondary | ICD-10-CM

## 2024-05-02 DIAGNOSIS — E039 Hypothyroidism, unspecified: Secondary | ICD-10-CM

## 2024-05-02 DIAGNOSIS — H04129 Dry eye syndrome of unspecified lacrimal gland: Secondary | ICD-10-CM

## 2024-05-02 NOTE — Patient Instructions (Signed)
 Thank you, Ms.Jeannetta Bottomley for allowing us  to provide your care today. Today we discussed:  - Will check labs. Please return to clinic for lab check.  Otherwise continue your current medications   I have ordered the following labs for you:   Lab Orders         Vitamin D  (25 hydroxy)         Lipid Profile         TSH + free T4      Tests ordered today:  As above   Referrals ordered today:   Referral Orders  No referral(s) requested today     I have ordered the following medication/changed the following medications:   Stop the following medications: There are no discontinued medications.   Start the following medications: No orders of the defined types were placed in this encounter.    Follow up: 6 months    Remember:   Should you have any questions or concerns please call the internal medicine clinic at [phone].     Rayann Atway, D.O. [facility]

## 2024-05-02 NOTE — Progress Notes (Signed)
 Established Patient Office Visit  Subjective   Patient ID: Jacqueline Wyatt, female    DOB: 12-Oct-1946  Age: 78 y.o. MRN: [id number]  Chief Complaint  Patient presents with   Follow-up    HPI  This is a 78 year old female with past medical history of chronic fibromyalgia, vitamin D  deficiency, osteoporosis, environmental allergies who is presenting today following up on her fibromyalgia.  Last office visit 11/01/2023.  ROS   As per assessment and plan Objective:     BP 139/89 (BP Location: Right Arm, Patient Position: Sitting, Cuff Size: Small)   Pulse 78   Temp 98.2 F (36.8 C) (Oral)   Ht 5' 5 (1.651 m)   Wt 192 lb 3.2 oz (87.2 kg)   SpO2 94%   BMI 31.98 kg/m  BP Readings from Last 3 Encounters:  05/02/24 139/89  11/01/23 137/84  05/05/23 129/82   Wt Readings from Last 3 Encounters:  05/02/24 192 lb 3.2 oz (87.2 kg)  11/01/23 178 lb 4.8 oz (80.9 kg)  09/28/23 173 lb (78.5 kg)      Physical Exam  General: Sitting in chair, no acute distress Cardiovascular: Regular rate Pulmonary: Breathing comfortably Abdomen: Soft, nontender, nondistended MSK: No lower extremity edema  Last CBC Lab Results  Component Value Date   WBC 4.3 03/09/2023   HGB 13.7 03/09/2023   HCT 40.1 03/09/2023   MCV 95 03/09/2023   MCH 32.5 03/09/2023   RDW 13.0 03/09/2023   PLT 226 03/09/2023   Last metabolic panel Lab Results  Component Value Date   GLUCOSE 115 (H) 12/09/2022   NA 139 12/09/2022   K 4.8 12/09/2022   CL 102 12/09/2022   CO2 21 12/09/2022   BUN 18 12/09/2022   CREATININE 0.96 12/09/2022   EGFR 55.0 12/29/2023   CALCIUM  9.4 12/09/2022   PROT 7.2 05/27/2021   ALBUMIN 3.9 05/27/2021   LABGLOB 3.3 11/17/2020   AGRATIO 1.2 11/17/2020   BILITOT 0.4 05/27/2021   ALKPHOS 68 05/27/2021   AST 27 05/27/2021   ALT 21 05/27/2021   ANIONGAP 8 03/24/2021   Last lipids Lab Results  Component Value Date   CHOL 209 (H) 07/29/2022   HDL 66 07/29/2022   LDLCALC 101 (H)  07/29/2022   TRIG 252 (H) 07/29/2022   CHOLHDL 3.2 07/29/2022   Last hemoglobin A1c Lab Results  Component Value Date   HGBA1C 5.7 (H) 03/24/2021   Last thyroid  functions Lab Results  Component Value Date   TSH 1.150 05/05/2023   T4TOTAL 6.2 11/13/2016      The ASCVD Risk score (Arnett DK, et al., 2019) failed to calculate for the following reasons:   Risk score cannot be calculated because patient has a medical history suggesting prior/existing ASCVD    Assessment & Plan:  Patient is discussed with Dr. Rosan   Problem List Items Addressed This Visit       Endocrine   Hypothyroidism - Primary   Lab Results  Component Value Date   TSH 1.150 05/05/2023   TSH checked today, currently taking Synthroid  50 mcg daily.  - Continue Synthroid  50 mcg daily      Relevant Orders   TSH + free T4     Other   Fibromyalgia (Chronic)   Patient reports that she was diagnosed over 20 years ago, has chronic fatigue and exhaustion that has been slowly progressively getting worse.  Reports that she takes care of of 6 cats in her house.  States that in the morning  when she wakes up she has to make herself get up and take care of her cats and take care of itself.  Patient reports that she spends majority of her day resting and laying.  Denies any physical activity.  Reports that she did do the physical therapy sessions from January till April, reports that it minimally helped.  Patient states that she is currently taking tramadol  50 mg, half a tablet twice daily.  No other concerns today. - Continue tramadol  50 mg, half a tablet twice daily      Vitamin D  deficiency   Last vitamin D  27.2, checked on 03/09/2023.  Patient reports that she is taking vitamin D3 supplementation daily. -Will check vitamin D  levels today      Relevant Orders   Vitamin D  (25 hydroxy)   Dyslipidemia   Patient is currently not on any medications for dyslipidemia.  Reports that she would still like to get her LDL  checked yearly even though she would not like to take any medications. -Will check lipid panel today      Relevant Orders   Lipid Profile   Dry eyes   Patient reports that she has been having episodes of dry eyes and would like referral to ophthalmologist to get her eyes checked.  On exam no conjunctival redness or tearing of her eyes noted -Ophthalmology referral sent      Other Visit Diagnoses       Dry eye       Relevant Orders   Ambulatory referral to Ophthalmology       Return in about 6 months (around 11/02/2024) for Routine .    Toma Edwards, DO

## 2024-05-04 DIAGNOSIS — H04123 Dry eye syndrome of bilateral lacrimal glands: Secondary | ICD-10-CM | POA: Insufficient documentation

## 2024-05-04 NOTE — Assessment & Plan Note (Signed)
 Patient is currently not on any medications for dyslipidemia.  Reports that she would still like to get her LDL checked yearly even though she would not like to take any medications. -Will check lipid panel today

## 2024-05-04 NOTE — Assessment & Plan Note (Signed)
 Patient reports that she was diagnosed over 20 years ago, has chronic fatigue and exhaustion that has been slowly progressively getting worse.  Reports that she takes care of of 6 cats in her house.  States that in the morning when she wakes up she has to make herself get up and take care of her cats and take care of itself.  Patient reports that she spends majority of her day resting and laying.  Denies any physical activity.  Reports that she did do the physical therapy sessions from January till April, reports that it minimally helped.  Patient states that she is currently taking tramadol  50 mg, half a tablet twice daily.  No other concerns today. - Continue tramadol  50 mg, half a tablet twice daily

## 2024-05-04 NOTE — Assessment & Plan Note (Signed)
 Lab Results  Component Value Date   TSH 1.150 05/05/2023   TSH checked today, currently taking Synthroid  50 mcg daily.  - Continue Synthroid  50 mcg daily

## 2024-05-04 NOTE — Assessment & Plan Note (Signed)
 Patient reports that she has been having episodes of dry eyes and would like referral to ophthalmologist to get her eyes checked.  On exam no conjunctival redness or tearing of her eyes noted -Ophthalmology referral sent

## 2024-05-04 NOTE — Assessment & Plan Note (Signed)
 Last vitamin D  27.2, checked on 03/09/2023.  Patient reports that she is taking vitamin D3 supplementation daily. -Will check vitamin D  levels today

## 2024-05-11 NOTE — Progress Notes (Signed)
 Internal Medicine Clinic Attending  Case discussed with the resident at the time of the visit.  We reviewed the resident's history and exam and pertinent patient test results.  I agree with the assessment, diagnosis, and plan of care documented in the resident's note.

## 2024-07-06 ENCOUNTER — Ambulatory Visit
Admission: EM | Admit: 2024-07-06 | Discharge: 2024-07-06 | Disposition: A | Discharge status: Home / Self Care | Attending: Family Medicine | Admitting: Family Medicine

## 2024-07-06 DIAGNOSIS — G8929 Other chronic pain: Secondary | ICD-10-CM | POA: Diagnosis not present

## 2024-07-06 DIAGNOSIS — S39012A Strain of muscle, fascia and tendon of lower back, initial encounter: Secondary | ICD-10-CM

## 2024-07-06 DIAGNOSIS — M545 Low back pain, unspecified: Secondary | ICD-10-CM

## 2024-07-06 MED ORDER — METHOCARBAMOL 500 MG PO TABS: 500.0000 mg | ORAL_TABLET | Freq: Every evening | ORAL | 0 refills | Status: DC | PRN

## 2024-07-06 MED ORDER — PREDNISONE 10 MG PO TABS
30.0000 mg | ORAL_TABLET | Freq: Every day | ORAL | 0 refills | Status: DC
Start: 2024-07-06 — End: 2024-08-25

## 2024-07-06 NOTE — ED Provider Notes (Signed)
 [facility]ARE [facility]  Note:  This document was prepared using Conservation officer, historic buildings and may include unintentional dictation errors.  MRN: [id number] DOB: [birth year]  Subjective:   Jacqueline Wyatt is a 78 y.o. female presenting for 6-day history of acute on chronic Hufstetler back pain, stiffness. She slipped and fell getting off of a large car, catching her fall with her left arm.  She did not fall and make impact against the ground.  No current facility-administered medications for this encounter.  Current Outpatient Medications:    B Complex Vitamins (VITAMIN B COMPLEX PO), Take 1 tablet by mouth daily., Disp: , Rfl:    cholecalciferol (VITAMIN D3) 25 MCG (1000 UNIT) tablet, Take 1,000 Units by mouth daily., Disp: , Rfl:    Coenzyme Q10 50 MG CHEW, Chew 100 mg by mouth daily., Disp: , Rfl:    diclofenac  Sodium (VOLTAREN ) 1 % GEL, Apply 2 g topically 4 (four) times daily., Disp: 100 g, Rfl: 0   levothyroxine  (SYNTHROID ) 50 MCG tablet, TAKE 1 TABLET BY MOUTH EVERY DAY BEFORE BREAKFAST, Disp: 90 tablet, Rfl: 3   loratadine  (CLARITIN ) 10 MG tablet, Take 10 mg by mouth daily., Disp: , Rfl:    traMADol  (ULTRAM ) 50 MG tablet, Take 0.5 tablets (25 mg total) by mouth 2 (two) times daily., Disp: 30 tablet, Rfl: 5   triamcinolone  cream (KENALOG ) 0.1 %, Apply 1 application. topically 2 (two) times daily., Disp: 30 g, Rfl: 0   Allergies  Allergen Reactions   Almotriptan Malate     Chest pain   Banana    Egg-Derived Products     Stomach pain   Imitrex [Sumatriptan Base]    Pear Other (See Comments)    Abdominal pain     Penicillins    Relpax [Eletriptan Hydrobromide] Hypertension   Sulfa Drugs Cross Reactors    Vantin     dizziness   Watermelon Flavoring Agent (Non-Screening)    Azithromycin Anxiety    Past Medical History:  Diagnosis Date   Allergy    Arthritis    Asthma    Chest pain of uncertain etiology 03/25/2021   Chronic pain syndrome 08/05/2017   Essential  hypertension 05/27/2021   Fibromyalgia    Fibromyalgia muscle pain    Neuromuscular disorder (HCC)    Osteoporosis    Right foot pain 07/29/2022   Patient has localized pain to medial aspect of right heel.  There is no visible swelling, redness or deformity.  After the physical examination I do not anticipate a fracture or acute bone pathology.  I will prescribe topical Voltaren  gel for symptomatic management.  We will reevaluate the patient on the follow-up visit.   Thyroid  disease      Past Surgical History:  Procedure Laterality Date   LEFT HEART CATH AND CORONARY ANGIOGRAPHY N/A 03/24/2021   Procedure: LEFT HEART CATH AND CORONARY ANGIOGRAPHY;  Surgeon: Dann Candyce RAMAN, MD;  Location: [facility] INVASIVE CV LAB;  Service: Cardiovascular;  Laterality: N/A;    Family History  Problem Relation Age of Onset   Heart disease Mother    Hypertension Mother    Hyperlipidemia Mother    Hearing loss Mother    Kidney disease Mother    Vision loss Mother    Lung disease Mother    Stroke Father    Arthritis Sister    Meniere's disease Brother    Cancer Maternal Grandmother    Stroke Maternal Grandmother    Heart disease Maternal Grandfather  Heart disease Maternal Uncle     Social History   Tobacco Use   Smoking status: Never   Smokeless tobacco: Never  Vaping Use   Vaping status: Never Used  Substance Use Topics   Alcohol use: Not Currently    Comment: rarly wine   Drug use: Never    ROS   Objective:   Vitals: There were no vitals taken for this visit.  BP Readings from Last 3 Encounters:  05/02/24 139/89  11/01/23 137/84  05/05/23 129/82    Physical Exam Constitutional:      General: She is not in acute distress.    Appearance: Normal appearance. She is well-developed. She is not ill-appearing, toxic-appearing or diaphoretic.  HENT:     Head: Normocephalic and atraumatic.     Nose: Nose normal.     Mouth/Throat:     Mouth: Mucous membranes are moist.  Eyes:      General: No scleral icterus.       Right eye: No discharge.        Left eye: No discharge.     Extraocular Movements: Extraocular movements intact.  Cardiovascular:     Rate and Rhythm: Normal rate.  Pulmonary:     Effort: Pulmonary effort is normal.  Musculoskeletal:     Lumbar back: Spasms and tenderness present. No swelling, edema, deformity, signs of trauma, lacerations or bony tenderness. Normal range of motion. Negative right straight leg raise test and negative left straight leg raise test. No scoliosis.  Skin:    General: Skin is warm and dry.  Neurological:     General: No focal deficit present.     Mental Status: She is alert and oriented to person, place, and time.  Psychiatric:        Mood and Affect: Mood normal.        Behavior: Behavior normal.     Assessment and Plan :   PDMP not reviewed this encounter.  1. Lumbar strain, initial encounter   2. 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 Acute bilateral Berthelot back pain without sciatica

## 2024-07-06 NOTE — ED Triage Notes (Signed)
 Pt reports Damico pain x 6 days. Pain is worse when day progress.  aspirin , Tramadol  and lidocaine  cream gives some relief. At night pain is 10/10

## 2024-08-16 ENCOUNTER — Encounter: Payer: Self-pay | Admitting: Student

## 2024-08-16 ENCOUNTER — Other Ambulatory Visit: Payer: Self-pay | Admitting: Student

## 2024-08-16 DIAGNOSIS — E039 Hypothyroidism, unspecified: Secondary | ICD-10-CM

## 2024-08-25 ENCOUNTER — Other Ambulatory Visit: Payer: Self-pay

## 2024-08-25 ENCOUNTER — Ambulatory Visit
Admission: RE | Admit: 2024-08-25 | Discharge: 2024-08-25 | Disposition: A | Discharge status: Home / Self Care | Source: Ambulatory Visit | Attending: Family Medicine | Admitting: Family Medicine

## 2024-08-25 VITALS — BP 146/84 | HR 79 | Temp 98.1°F | Resp 20

## 2024-08-25 DIAGNOSIS — S46812A Strain of other muscles, fascia and tendons at shoulder and upper arm level, left arm, initial encounter: Secondary | ICD-10-CM

## 2024-08-25 MED ORDER — LIDOCAINE 5 % EX PTCH: 1.0000 | MEDICATED_PATCH | CUTANEOUS | 0 refills | Status: AC

## 2024-08-25 MED ORDER — PREDNISONE 20 MG PO TABS: 20.0000 mg | ORAL_TABLET | Freq: Every day | ORAL | 0 refills | Status: AC

## 2024-08-25 NOTE — ED Provider Notes (Signed)
 [facility]    CSN: [id number] Arrival date & time: 08/25/24  1322      History   Chief Complaint No chief complaint on file.   HPI Jacqueline Wyatt is a 78 y.o. female presents for arm pain.  Patient has complex medical history including fibromyalgia, chronic pain syndrome, arthritis, asthma, hypertension.  She states 2 weeks ago she developed a intermittent left lateral deltoid muscle pain that occurs primarily with movement.  No known injury or inciting event but does state on Labor Day she hyperextended her arm while getting out of a truck.  No recent event.  No neck pain.  Denies numbness tingling or weakness of the arm but does feel the pain is radiating into her lower arm.  No shoulder pain.  No chest pain or shortness of breath.  No history of surgeries to this arm in the past.  She has been taking aspirin  with minimal improvement.  She states Tylenol  makes her feel weird and Aleve/ibuprofen are not helpful.  She does take tramadol  for other issues and has been taking a smaller dose for this current complaint.  No other concerns at this time  HPI  Past Medical History:  Diagnosis Date   Allergy    Arthritis    Asthma    Chest pain of uncertain etiology 03/25/2021   Chronic pain syndrome 08/05/2017   Essential hypertension 05/27/2021   Fibromyalgia    Fibromyalgia muscle pain    Neuromuscular disorder (HCC)    Osteoporosis    Right foot pain 07/29/2022   Patient has localized pain to medial aspect of right heel.  There is no visible swelling, redness or deformity.  After the physical examination I do not anticipate a fracture or acute bone pathology.  I will prescribe topical Voltaren  gel for symptomatic management.  We will reevaluate the patient on the follow-up visit.   Thyroid  disease     Patient Active Problem List   Diagnosis Date Noted   Dry eyes 05/04/2024   Chronic fatigue 03/09/2023   Hearing impairment 09/09/2022   Dyslipidemia 11/07/2019   Vitamin D   deficiency 08/05/2017   Hypothyroidism 11/24/2011   Environmental allergies 11/24/2011   Fibromyalgia 11/24/2011   Irritable bowel syndrome (IBS) 11/24/2011   Osteoporosis 11/24/2011    Past Surgical History:  Procedure Laterality Date   LEFT HEART CATH AND CORONARY ANGIOGRAPHY N/A 03/24/2021   Procedure: LEFT HEART CATH AND CORONARY ANGIOGRAPHY;  Surgeon: Dann Candyce RAMAN, MD;  Location: [facility] INVASIVE CV LAB;  Service: Cardiovascular;  Laterality: N/A;    OB History   No obstetric history on file.      Home Medications    Prior to Admission medications   Medication Sig Start Date End Date Taking? Authorizing Provider  lidocaine  (LIDODERM ) 5 % Place 1 patch onto the skin daily. Leave on affected area for 12 hours and remove for 12 hours 08/25/24  Yes Amilya Haver, Jodi R, NP  predniSONE  (DELTASONE ) 20 MG tablet Take 1 tablet (20 mg total) by mouth daily with breakfast for 5 days. 08/25/24 08/30/24 Yes Charletha Dalpe, Jodi R, NP  B Complex Vitamins (VITAMIN B COMPLEX PO) Take 1 tablet by mouth daily.    [provider]  cholecalciferol (VITAMIN D3) 25 MCG (1000 UNIT) tablet Take 1,000 Units by mouth daily.    [provider]  Coenzyme Q10 50 MG CHEW Chew 100 mg by mouth daily.    [provider]  levothyroxine  (SYNTHROID ) 50 MCG tablet TAKE 1 TABLET BY  MOUTH EVERY DAY BEFORE BREAKFAST 03/29/24   Zheng, [name], DO  traMADol  (ULTRAM ) 50 MG tablet Take 0.5 tablets (25 mg total) by mouth 2 (two) times daily. 04/19/24   Elicia Sharper, DO    Family History Family History  Problem Relation Age of Onset   Heart disease Mother    Hypertension Mother    Hyperlipidemia Mother    Hearing loss Mother    Kidney disease Mother    Vision loss Mother    Lung disease Mother    Stroke Father    Arthritis Sister    Meniere's disease Brother    Cancer Maternal Grandmother    Stroke Maternal Grandmother    Heart disease Maternal Grandfather    Heart disease Maternal Uncle      Social History Social History   Tobacco Use   Smoking status: Never   Smokeless tobacco: Never  Vaping Use   Vaping status: Never Used  Substance Use Topics   Alcohol use: Not Currently    Comment: rarly wine   Drug use: Never     Allergies   Almotriptan malate, Banana, Egg protein-containing drug products, Imitrex [sumatriptan base], Pear, Penicillins, Relpax [eletriptan hydrobromide], Sulfa drugs cross reactors, Vantin, Watermelon flavoring agent (non-screening), and Azithromycin   Review of Systems Review of Systems  Musculoskeletal:        Left deltoid muscle pain     Physical Exam Triage Vital Signs ED Triage Vitals  Encounter Vitals Group     BP 08/25/24 1408 (!) 146/84     Girls Systolic BP Percentile --      Girls Diastolic BP Percentile --      Boys Systolic BP Percentile --      Boys Diastolic BP Percentile --      Pulse Rate 08/25/24 1408 79     Resp 08/25/24 1408 20     Temp 08/25/24 1408 98.1 F (36.7 C)     Temp Source 08/25/24 1408 Oral     SpO2 08/25/24 1408 95 %     Weight --      Height --      Head Circumference --      Peak Flow --      Pain Score 08/25/24 1404 3     Pain Loc --      Pain Education --      Exclude from Growth Chart --    No data found.  Updated Vital Signs BP (!) 146/84   Pulse 79   Temp 98.1 F (36.7 C) (Oral)   Resp 20   SpO2 95%   Visual Acuity Right Eye Distance:   Left Eye Distance:   Bilateral Distance:    Right Eye Near:   Left Eye Near:    Bilateral Near:     Physical Exam Vitals and nursing note reviewed.  Constitutional:      General: She is not in acute distress.    Appearance: Normal appearance. She is not ill-appearing.  HENT:     Head: Normocephalic and atraumatic.  Eyes:     Pupils: Pupils are equal, round, and reactive to light.  Cardiovascular:     Rate and Rhythm: Normal rate.  Pulmonary:     Effort: Pulmonary effort is normal.  Musculoskeletal:       Arms:     Cervical  back: Full passive range of motion without pain, normal range of motion and neck supple. No rigidity or tenderness. No spinous process tenderness or muscular tenderness.  Comments: There is no swelling, ecchymosis of the left upper arm.  There is tenderness to the deltoid.  She has full range of motion of the arm with pain with raising arm above head and lateral raise.  Strength is 5 out of 5 bilateral upper extremities.  Skin:    General: Skin is warm and dry.  Neurological:     General: No focal deficit present.     Mental Status: She is alert and oriented to person, place, and time.  Psychiatric:        Mood and Affect: Mood normal.        Behavior: Behavior normal.      UC Treatments / Results  Labs (all labs ordered are listed, but only abnormal results are displayed) Labs Reviewed - No data to display  EKG   Radiology No results found.  Procedures Procedures (including critical care time)  Medications Ordered in UC Medications - No data to display  Initial Impression / Assessment and Plan / UC Course  I have reviewed the triage vital signs and the nursing notes.  Pertinent labs & imaging results that were available during my care of the patient were reviewed by me and considered in my medical decision making (see chart for details).     Reviewed exam and symptoms with patient.  No red flags.  Complains of left deltoid muscle pain x 2 weeks with no known injury or inciting event.  No shoulder pain or neck pain.  Will treat as muscle strain with a Lidoderm  patch and Danford-dose prednisone .  Patient states Tylenol  makes her feel weird and NSAIDs do not work for her.  Advised heat to the area and rest and PCP follow-up in 2 to 3 days for recheck.  ER precautions reviewed. Final Clinical Impressions(s) / UC Diagnoses   Final diagnoses:  Strain of left deltoid muscle, initial encounter     Discharge Instructions      You may use the lidoDerm  patch to your left arm as  needed. Leave in place for 12 hours and remove for 12 hours.  You may take prednisone  daily for 5 days.  Heat to the area and lots of rest.  Please follow-up with your PCP in 2 to 3 days for recheck.  Please go to the ER if you develop any worsening symptoms.  Hope you feel better soon!    ED Prescriptions     Medication Sig Dispense Auth. Provider   lidocaine  (LIDODERM ) 5 % Place 1 patch onto the skin daily. Leave on affected area for 12 hours and remove for 12 hours 10 patch Carma Dwiggins, Jodi R, NP   predniSONE  (DELTASONE ) 20 MG tablet Take 1 tablet (20 mg total) by mouth daily with breakfast for 5 days. 5 tablet Sunshyne Horvath, Jodi R, NP      PDMP not reviewed this encounter.   Loreda Myla SAUNDERS, NP 08/25/24 [phone]

## 2024-08-25 NOTE — Discharge Instructions (Addendum)
 You may use the lidoDerm  patch to your left arm as needed. Leave in place for 12 hours and remove for 12 hours.  You may take prednisone  daily for 5 days.  Heat to the area and lots of rest.  Please follow-up with your PCP in 2 to 3 days for recheck.  Please go to the ER if you develop any worsening symptoms.  Hope you feel better soon!

## 2024-08-25 NOTE — ED Triage Notes (Signed)
 Pt states on Labor day she was climbing out of a tow truck and her foot slipped on last step and she fell, but was still holding on handle with left arm and her it got yanked backwards. Pt c/o left upper arm pain that radiates down to thumbx2wks. PT states it's painful trying to do any movement with left arm. PT denies numbness or tingling. Pt does have ROM of arm, but states it's painful when lifting it up.

## 2024-09-17 ENCOUNTER — Other Ambulatory Visit: Payer: Self-pay | Admitting: Student

## 2024-09-17 DIAGNOSIS — E039 Hypothyroidism, unspecified: Secondary | ICD-10-CM

## 2024-09-17 NOTE — Telephone Encounter (Unsigned)
 Copied from CRM #[id number]. Topic: Clinical - Medication Refill >> Sep 17, 2024  3:49 PM Carrielelia G wrote: Medication: levothyroxine  (SYNTHROID ) 50 MCG tablet  Has the patient contacted their pharmacy? No (Agent: If no, request that the patient contact the pharmacy for the refill. If patient does not wish to contact the pharmacy document the reason why and proceed with request.) (Agent: If yes, when and what did the pharmacy advise?)  This is the patient's preferred pharmacy:  [id number] [place], [place] - [address] AT [facility] [address] [place] [address] Phone: [phone] Fax: [phone]   Is this the correct pharmacy for this prescription? Yes If no, delete pharmacy and type the correct one.    Is the patient out of the medication? Yes  Has the patient been seen for an appointment in the last year OR does the patient have an upcoming appointment? Yes  Can we respond through MyChart? No  Agent: Please be advised that Rx refills may take up to 3 business days. We ask that you follow-up with your pharmacy.

## 2024-09-18 ENCOUNTER — Other Ambulatory Visit: Payer: Self-pay

## 2024-09-18 ENCOUNTER — Encounter: Payer: Self-pay | Admitting: Student

## 2024-09-18 ENCOUNTER — Ambulatory Visit (INDEPENDENT_AMBULATORY_CARE_PROVIDER_SITE_OTHER): Admitting: Student

## 2024-09-18 VITALS — BP 132/78 | HR 76 | Temp 98.1°F | Ht 65.0 in | Wt 196.0 lb

## 2024-09-18 DIAGNOSIS — I252 Old myocardial infarction: Secondary | ICD-10-CM

## 2024-09-18 DIAGNOSIS — E039 Hypothyroidism, unspecified: Secondary | ICD-10-CM | POA: Diagnosis not present

## 2024-09-18 DIAGNOSIS — E785 Hyperlipidemia, unspecified: Secondary | ICD-10-CM

## 2024-09-18 DIAGNOSIS — Z79890 Hormone replacement therapy (postmenopausal): Secondary | ICD-10-CM | POA: Diagnosis not present

## 2024-09-18 DIAGNOSIS — Z823 Family history of stroke: Secondary | ICD-10-CM

## 2024-09-18 DIAGNOSIS — E7800 Pure hypercholesterolemia, unspecified: Secondary | ICD-10-CM

## 2024-09-18 DIAGNOSIS — Z83438 Family history of other disorder of lipoprotein metabolism and other lipidemia: Secondary | ICD-10-CM

## 2024-09-18 DIAGNOSIS — Z79899 Other long term (current) drug therapy: Secondary | ICD-10-CM

## 2024-09-18 DIAGNOSIS — E559 Vitamin D deficiency, unspecified: Secondary | ICD-10-CM

## 2024-09-18 DIAGNOSIS — Z91148 Patient's other noncompliance with medication regimen for other reason: Secondary | ICD-10-CM

## 2024-09-18 DIAGNOSIS — Z7409 Other reduced mobility: Secondary | ICD-10-CM

## 2024-09-18 DIAGNOSIS — Z6832 Body mass index (BMI) 32.0-32.9, adult: Secondary | ICD-10-CM

## 2024-09-18 DIAGNOSIS — E66811 Obesity, class 1: Secondary | ICD-10-CM

## 2024-09-18 DIAGNOSIS — E669 Obesity, unspecified: Secondary | ICD-10-CM

## 2024-09-18 DIAGNOSIS — M797 Fibromyalgia: Secondary | ICD-10-CM

## 2024-09-18 DIAGNOSIS — Z8249 Family history of ischemic heart disease and other diseases of the circulatory system: Secondary | ICD-10-CM

## 2024-09-18 MED ORDER — LEVOTHYROXINE SODIUM 50 MCG PO TABS: ORAL_TABLET | ORAL | 3 refills | Status: DC

## 2024-09-18 NOTE — Patient Instructions (Signed)
 VISIT SUMMARY: During your visit, we discussed your fibromyalgia, hypothyroidism, weight management, and the need for disability paperwork renewal. We also addressed your medication adherence and ordered several lab tests to assess your current health status.  YOUR PLAN: FIBROMYALGIA WITH CHRONIC PAIN AND CHRONIC BACK PAIN: You have chronic fibromyalgia causing severe pain and fatigue, which affects your daily activities. -Continue taking tramadol  for pain management. -Gradually increase your physical activity, such as walking. -You have been referred to [facility], a dietitian, for dietary counseling. -We will have a follow-up appointment in three months.  HYPOTHYROIDISM: You have had a recent lapse in taking your levothyroxine  medication. -A TSH level test has been ordered to assess your current thyroid  function. -You have been prescribed levothyroxine  50 mcg, 90 tablets with three refills, to be filled at [facility] on [facility].  OBESITY: Your weight is significantly above your pre-menopausal weight, and you have expressed a desire to lose weight. -You have been referred to [facility], a dietitian, for dietary counseling. -Increase your physical activity to aid in weight loss.  HYPERLIPIDEMIA: Your dietary habits may be affecting your lipid levels. -A lipid profile test has been ordered to assess your current status.  VITAMIN D  DEFICIENCY: We need to check your vitamin D  levels. -A vitamin D  level test has been ordered to assess your current status.  Remember to bring all of the medications that you take (including over the counter medications and supplements) with you to every clinic visit.  This after visit summary is an important review of tests, referrals, and medication changes that were discussed during your visit. If you have questions or concerns, call [phone]. Outside of clinic business hours, call the main hospital at [phone] and ask the operator for the  on-call internal medicine resident.   Ozell Kung MD 09/18/2024, 4:53 PM

## 2024-09-19 DIAGNOSIS — E66811 Obesity, class 1: Secondary | ICD-10-CM | POA: Insufficient documentation

## 2024-09-19 LAB — LIPID PANEL
Chol/HDL Ratio: 3 ratio (ref 0.0–4.4)
Cholesterol, Total: 201 mg/dL — ABNORMAL HIGH (ref 100–199)
HDL: 67 mg/dL (ref >39)
LDL Chol Calc (NIH): 109 mg/dL — ABNORMAL HIGH (ref 0–99)
Triglycerides: 144 mg/dL (ref 0–149)
VLDL Cholesterol Cal: 25 mg/dL (ref 5–40)

## 2024-09-19 LAB — TSH RFX ON ABNORMAL TO FREE T4: TSH: 3.87 u[IU]/mL (ref 0.450–4.500)

## 2024-09-19 LAB — VITAMIN D 25 HYDROXY (VIT D DEFICIENCY, FRACTURES): Vit D, 25-Hydroxy: 26.2 ng/mL — ABNORMAL LOW (ref 30.0–100.0)

## 2024-09-19 NOTE — Progress Notes (Signed)
 Patient name: Jacqueline Wyatt Date of birth: 1946-04-21 Date of visit: 09/19/24  Subjective  Reason for visit: Follow-up (Handicap place card needing signing / medication refill / left shoulder pain) and Hypothyroidism  Discussed the use of AI scribe software for clinical note transcription with the patient, who gave verbal consent to proceed.  History of Present Illness   Jacqueline Wyatt is a 78 year old female with fibromyalgia who presents for levothyroxine  renewal and disability paperwork for a handicap sticker.  Hypothyroid symptoms and levothyroxine  nonadherence - Has been without levothyroxine  for a couple of months, previously taking 50 micrograms in a chewable form - No symptoms of hypothyroidism during this period, including no weight gain, constipation, hair loss, or leg swelling - Weight has remained stable since menopause, with an increase to 196 pounds attributed to dietary habits, particularly sugar intake  Chronic widespread pain and functional impairment - Fibromyalgia causes widespread pain and stiffness, limiting ambulation to less than 200 feet without rest - Occasionally uses a cane for mobility - Muscular pain worsens with prolonged sitting or standing, resulting in stiffness and discomfort - Progressive worsening of fibromyalgia over the past decade, impacting ability to prepare meals and participate in physical activities - Severe back pain over the past two months, with a history of upper back pain since 1985 - Has previously participated in physical therapy  Fatigue and activity intolerance - Experiences extreme fatigue, requiring frequent rest periods - Difficulty with weight loss due to fibromyalgia and fatigue  Analgesic use and pharmacy access issues - Takes tramadol  for pain management, half a tablet daily for the past 20 years - Currently has sufficient tramadol  to last until December 23rd - Has experienced difficulties with pharmacies refusing to fill tramadol   prescription, necessitating [facility], which has been more accommodating  Mobility limitation and disability documentation - Requires renewal of disability paperwork for a handicap parking sticker, which is needed every five years due to mobility limitations       Current Outpatient Medications  Medication Instructions   B Complex Vitamins (VITAMIN B COMPLEX PO) 1 tablet, Daily   cholecalciferol (VITAMIN D3) 1,000 Units, Daily   Coenzyme Q10 100 mg, Daily   levothyroxine  (SYNTHROID ) 50 MCG tablet TAKE 1 TABLET BY MOUTH EVERY DAY BEFORE BREAKFAST   lidocaine  (LIDODERM ) 5 % 1 patch, Transdermal, Every 24 hours, Leave on affected area for 12 hours and remove for 12 hours   traMADol  (ULTRAM ) 25 mg, Oral, 2 times daily     Objective  Today's Vitals   09/18/24 1540  BP: 132/78  Pulse: 76  Temp: 98.1 F (36.7 C)  TempSrc: Oral  SpO2: 98%  Weight: 196 lb (88.9 kg)  Height: 5' 5 (1.651 m)  PainSc: 4   PainLoc: Shoulder  Body mass index is 32.62 kg/m.  Physical Exam Musculoskeletal:     Comments: Tenderness diffusely, especially bilateral lumbar paraspinal areas and bilateral lateral thighs      Assessment & Plan Hypothyroidism, unspecified type Lab Results  Component Value Date   TSH 3.870 09/18/2024   TSH 1.150 05/05/2023   TSH 3.460 07/29/2022   T4TOTAL 6.2 11/13/2016   No hypothyroid state without thyroid  supplementation for months. Recommend discontinuing thyroid  supplementation. Monitor clinically for hypothyroid signs and symptoms, check TSH as needed at follow-up.  Orders:   TSH Rfx on Abnormal to Free T4   Medications Discontinued During This Encounter  Medication Reason   levothyroxine  (SYNTHROID ) 50 MCG tablet Reorder   levothyroxine  (SYNTHROID ) 50  MCG tablet Change in therapy   Vitamin D  deficiency Lab Results  Component Value Date   VD25OH 26.2 (L) 09/18/2024   VD25OH 27.2 (L) 03/09/2023   VD25OH 34.3 12/09/2022   Mild insufficiency. Continue  supplementation with cholecalciferol 1000 units daily.  Orders:   Vitamin D  (25 hydroxy)   Fibromyalgia Chronic, with waxing and waning course. Recent exacerbation involved [name] back, but she's slowly returning to baseline. Pain is disabling at times requiring use of cane. Will re-certify for 5 more years of disability parking space use. Failed trial of SNRI previously for chronic pain. Uses tramadol  daily to decent effect. She generally uses less than what is prescribed and has excess at home. She was instructed to call 2 weeks prior refill need in case of problems filling prescription at pharmacy, which she has had in the past.     Obesity (BMI 30.0-34.9) BMI Readings from Last 3 Encounters:  09/18/24 32.62 kg/m  05/02/24 31.98 kg/m  11/01/23 29.67 kg/m   I recommend eliminating sugary beverages like soda, sweet tea, and juice entirely. I recommended more vegetables, lean protein, and legumes. Frozen vegetables are healthy and inexpensive. Beans are a healthy and inexpensive source of lean protein and fiber. I recommend gradually increasing exercise. A daily walk is a great way to start an exercise program.  Referral: to medical nutrition services  Pharmacological intervention: deferred  Orders:   Referral to Nutrition and Diabetes Services  Pure hypercholesterolemia Lab Results  Component Value Date   LDLCALC 109 (H) 09/18/2024   LDLCALC 101 (H) 07/29/2022   LDLCALC 99 05/27/2021   With history of NSTEMI in 2022 without angiographically apparent CAD. Echo was normal. Etiology remains unknown. Reports no recent angina-like pain. Doesn't follow with cardiology. Not on aspirin  or statin, she has declined latter during prior visits. She may benefit from aspirin  and statin for secondary prevention even though it seems as though her NSTEMI was not due to underlying ASCVD--this can be discussed at follow-up.  Orders:   Lipid Profile   Return in about 3 months (around  12/19/2024).  Ozell Kung MD 09/19/2024, 8:44 AM

## 2024-09-19 NOTE — Assessment & Plan Note (Signed)
 Lab Results  Component Value Date   LDLCALC 109 (H) 09/18/2024   LDLCALC 101 (H) 07/29/2022   LDLCALC 99 05/27/2021   With history of NSTEMI in 2022 without angiographically apparent CAD. Echo was normal. Etiology remains unknown. Reports no recent angina-like pain. Doesn't follow with cardiology. Not on aspirin  or statin, she has declined latter during prior visits. She may benefit from aspirin  and statin for secondary prevention even though it seems as though her NSTEMI was not due to underlying ASCVD--this can be discussed at follow-up.  Orders:   Lipid Profile

## 2024-09-19 NOTE — Assessment & Plan Note (Signed)
 Lab Results  Component Value Date   VD25OH 26.2 (L) 09/18/2024   VD25OH 27.2 (L) 03/09/2023   VD25OH 34.3 12/09/2022   Mild insufficiency. Continue supplementation with cholecalciferol 1000 units daily.  Orders:   Vitamin D  (25 hydroxy)

## 2024-09-19 NOTE — Assessment & Plan Note (Signed)
 BMI Readings from Last 3 Encounters:  09/18/24 32.62 kg/m  05/02/24 31.98 kg/m  11/01/23 29.67 kg/m   I recommend eliminating sugary beverages like soda, sweet tea, and juice entirely. I recommended more vegetables, lean protein, and legumes. Frozen vegetables are healthy and inexpensive. Beans are a healthy and inexpensive source of lean protein and fiber. I recommend gradually increasing exercise. A daily walk is a great way to start an exercise program.  Referral: to medical nutrition services  Pharmacological intervention: deferred  Orders:   Referral to Nutrition and Diabetes Services

## 2024-09-19 NOTE — Assessment & Plan Note (Signed)
 Chronic, with waxing and waning course. Recent exacerbation involved [place] back, but she's slowly returning to baseline. Pain is disabling at times requiring use of cane. Will re-certify for 5 more years of disability parking space use. Failed trial of SNRI previously for chronic pain. Uses tramadol  daily to decent effect. She generally uses less than what is prescribed and has excess at home. She was instructed to call 2 weeks prior refill need in case of problems filling prescription at pharmacy, which she has had in the past.

## 2024-09-19 NOTE — Assessment & Plan Note (Signed)
 Lab Results  Component Value Date   TSH 3.870 09/18/2024   TSH 1.150 05/05/2023   TSH 3.460 07/29/2022   T4TOTAL 6.2 11/13/2016   No hypothyroid state without thyroid  supplementation for months. Recommend discontinuing thyroid  supplementation. Monitor clinically for hypothyroid signs and symptoms, check TSH as needed at follow-up.  Orders:   TSH Rfx on Abnormal to Free T4   Medications Discontinued During This Encounter  Medication Reason   levothyroxine  (SYNTHROID ) 50 MCG tablet Reorder   levothyroxine  (SYNTHROID ) 50 MCG tablet Change in therapy

## 2024-09-24 ENCOUNTER — Ambulatory Visit: Payer: Self-pay | Admitting: Student

## 2024-09-25 NOTE — Progress Notes (Signed)
 Internal Medicine Clinic Attending  Case discussed with the resident at the time of the visit.  We reviewed the resident's history and exam and pertinent patient test results.  I agree with the assessment, diagnosis, and plan of care documented in the resident's note.

## 2024-09-27 ENCOUNTER — Other Ambulatory Visit (HOSPITAL_BASED_OUTPATIENT_CLINIC_OR_DEPARTMENT_OTHER): Payer: Self-pay

## 2024-11-13 ENCOUNTER — Other Ambulatory Visit: Payer: Self-pay | Admitting: *Deleted

## 2024-11-13 DIAGNOSIS — M797 Fibromyalgia: Secondary | ICD-10-CM

## 2024-11-13 MED ORDER — TRAMADOL HCL 50 MG PO TABS: 25.0000 mg | ORAL_TABLET | Freq: Two times a day (BID) | ORAL | 5 refills | Status: DC

## 2024-11-13 NOTE — Telephone Encounter (Signed)
 Copied from CRM #[id number]. Topic: Clinical - Medication Refill >> Nov 13, 2024  2:59 PM Fredrica W wrote: Medication: traMADol  (ULTRAM ) 50 MG tablet - was advised by Dr. Norrine to call about a week before you   Has the patient contacted their pharmacy? No (Agent: If no, request that the patient contact the pharmacy for the refill. If patient does not wish to contact the pharmacy document the reason why and proceed with request.) (Agent: If yes, when and what did the pharmacy advise?)  This is the patient's preferred pharmacy:  [id number] [place], [place] - [address] AT [facility] [address] [place] [address] Phone: [phone] Fax: [phone]  Is this the correct pharmacy for this prescription? Yes If no, delete pharmacy and type the correct one.   Has the prescription been filled recently? No  Is the patient out of the medication? No about 8 or 9 left  Has the patient been seen for an appointment in the last year OR does the patient have an upcoming appointment? Yes  Can we respond through MyChart? No  Agent: Please be advised that Rx refills may take up to 3 business days. We ask that you follow-up with your pharmacy.

## 2024-11-20 ENCOUNTER — Other Ambulatory Visit: Payer: Self-pay | Admitting: *Deleted

## 2024-11-20 DIAGNOSIS — M797 Fibromyalgia: Secondary | ICD-10-CM

## 2024-11-20 MED ORDER — TRAMADOL HCL 50 MG PO TABS: 25.0000 mg | ORAL_TABLET | Freq: Two times a day (BID) | ORAL | 5 refills | Status: AC

## 2024-11-20 NOTE — Telephone Encounter (Signed)
 Copied from CRM [id number]. Topic: Clinical - Prescription Issue >> Nov 16, 2024  4:46 PM Chiquita SQUIBB wrote: Reason for CRM: Patient is calling in stating that the traMADol  (ULTRAM ) 50 MG tablet, [id number] [place], [place] - [address]. The medication is showing that it went to the [facility] pharmacy. Patient is asking if this can be sent over asap, as she is completely out.

## 2024-11-20 NOTE — Telephone Encounter (Signed)
 Called pt - no answer; phone continued to ring, unable to leave a message.

## 2024-12-20 ENCOUNTER — Ambulatory Visit: Payer: Self-pay | Admitting: Student
